# Patient Record
Sex: Female | Born: 1945 | Race: White | Hispanic: No | Smoking: Never smoker
Health system: Southern US, Community
[De-identification: ages and names within clinical notes are randomized; demographics above are authoritative.]

## PROBLEM LIST (undated history)

## (undated) DIAGNOSIS — K2990 Gastroduodenitis, unspecified, without bleeding: Secondary | ICD-10-CM

## (undated) DIAGNOSIS — J029 Acute pharyngitis, unspecified: Secondary | ICD-10-CM

## (undated) DIAGNOSIS — J309 Allergic rhinitis, unspecified: Secondary | ICD-10-CM

## (undated) DIAGNOSIS — M810 Age-related osteoporosis without current pathological fracture: Secondary | ICD-10-CM

## (undated) DIAGNOSIS — G4730 Sleep apnea, unspecified: Secondary | ICD-10-CM

## (undated) DIAGNOSIS — T7840XA Allergy, unspecified, initial encounter: Secondary | ICD-10-CM

## (undated) DIAGNOSIS — K589 Irritable bowel syndrome without diarrhea: Secondary | ICD-10-CM

## (undated) DIAGNOSIS — J45909 Unspecified asthma, uncomplicated: Secondary | ICD-10-CM

## (undated) DIAGNOSIS — Z860100 Personal history of colon polyps, unspecified: Secondary | ICD-10-CM

## (undated) DIAGNOSIS — J4489 Other specified chronic obstructive pulmonary disease: Secondary | ICD-10-CM

## (undated) DIAGNOSIS — I10 Essential (primary) hypertension: Secondary | ICD-10-CM

## (undated) DIAGNOSIS — H6980 Other specified disorders of Eustachian tube, unspecified ear: Secondary | ICD-10-CM

## (undated) DIAGNOSIS — K649 Unspecified hemorrhoids: Secondary | ICD-10-CM

## (undated) DIAGNOSIS — H6990 Unspecified Eustachian tube disorder, unspecified ear: Secondary | ICD-10-CM

## (undated) DIAGNOSIS — J019 Acute sinusitis, unspecified: Secondary | ICD-10-CM

## (undated) DIAGNOSIS — J449 Chronic obstructive pulmonary disease, unspecified: Secondary | ICD-10-CM

## (undated) DIAGNOSIS — K219 Gastro-esophageal reflux disease without esophagitis: Secondary | ICD-10-CM

## (undated) DIAGNOSIS — K2970 Gastritis, unspecified, without bleeding: Secondary | ICD-10-CM

## (undated) DIAGNOSIS — K449 Diaphragmatic hernia without obstruction or gangrene: Secondary | ICD-10-CM

## (undated) DIAGNOSIS — M8580 Other specified disorders of bone density and structure, unspecified site: Secondary | ICD-10-CM

## (undated) DIAGNOSIS — K222 Esophageal obstruction: Secondary | ICD-10-CM

## (undated) HISTORY — DX: Unspecified eustachian tube disorder, unspecified ear: H69.90

## (undated) HISTORY — DX: Diaphragmatic hernia without obstruction or gangrene: K44.9

## (undated) HISTORY — PX: CYST REMOVAL HAND: SHX6279

## (undated) HISTORY — PX: TONSILLECTOMY: SUR1361

## (undated) HISTORY — DX: Acute sinusitis, unspecified: J01.90

## (undated) HISTORY — DX: Gastritis, unspecified, without bleeding: K29.70

## (undated) HISTORY — DX: Other specified disorders of bone density and structure, unspecified site: M85.80

## (undated) HISTORY — DX: Allergy, unspecified, initial encounter: T78.40XA

## (undated) HISTORY — DX: Acute pharyngitis, unspecified: J02.9

## (undated) HISTORY — DX: Esophageal obstruction: K22.2

## (undated) HISTORY — DX: Other specified disorders of Eustachian tube, unspecified ear: H69.80

## (undated) HISTORY — DX: Gastroduodenitis, unspecified, without bleeding: K29.90

## (undated) HISTORY — PX: FOOT SURGERY: SHX648

## (undated) HISTORY — PX: UPPER GASTROINTESTINAL ENDOSCOPY: SHX188

## (undated) HISTORY — DX: Gastro-esophageal reflux disease without esophagitis: K21.9

## (undated) HISTORY — PX: ABDOMINAL HYSTERECTOMY: SHX81

## (undated) HISTORY — PX: LAPAROSCOPIC HYSTERECTOMY: SHX1926

## (undated) HISTORY — DX: Other specified chronic obstructive pulmonary disease: J44.89

## (undated) HISTORY — DX: Allergic rhinitis, unspecified: J30.9

## (undated) HISTORY — DX: Personal history of colonic polyps: Z86.010

## (undated) HISTORY — DX: Age-related osteoporosis without current pathological fracture: M81.0

## (undated) HISTORY — DX: Irritable bowel syndrome, unspecified: K58.9

## (undated) HISTORY — PX: COLONOSCOPY: SHX174

## (undated) HISTORY — DX: Unspecified asthma, uncomplicated: J45.909

## (undated) HISTORY — DX: Chronic obstructive pulmonary disease, unspecified: J44.9

## (undated) HISTORY — DX: Personal history of colon polyps, unspecified: Z86.0100

## (undated) HISTORY — DX: Unspecified hemorrhoids: K64.9

---

## 1999-04-20 ENCOUNTER — Emergency Department (HOSPITAL_COMMUNITY): Admission: EM | Admit: 1999-04-20 | Discharge: 1999-04-20 | Payer: Self-pay | Admitting: Emergency Medicine

## 1999-04-20 ENCOUNTER — Encounter: Payer: Self-pay | Admitting: Emergency Medicine

## 1999-08-29 ENCOUNTER — Other Ambulatory Visit: Admission: RE | Admit: 1999-08-29 | Discharge: 1999-08-29 | Payer: Self-pay | Admitting: *Deleted

## 2000-03-14 ENCOUNTER — Ambulatory Visit (HOSPITAL_COMMUNITY): Admission: RE | Admit: 2000-03-14 | Discharge: 2000-03-14 | Payer: Self-pay | Admitting: *Deleted

## 2001-03-15 ENCOUNTER — Ambulatory Visit (HOSPITAL_COMMUNITY): Admission: RE | Admit: 2001-03-15 | Discharge: 2001-03-15 | Payer: Self-pay | Admitting: Gastroenterology

## 2001-03-15 ENCOUNTER — Encounter: Payer: Self-pay | Admitting: Gastroenterology

## 2001-03-21 ENCOUNTER — Encounter (INDEPENDENT_AMBULATORY_CARE_PROVIDER_SITE_OTHER): Payer: Self-pay

## 2001-03-21 ENCOUNTER — Other Ambulatory Visit: Admission: RE | Admit: 2001-03-21 | Discharge: 2001-03-21 | Payer: Self-pay | Admitting: Gastroenterology

## 2001-04-12 ENCOUNTER — Ambulatory Visit (HOSPITAL_COMMUNITY): Admission: RE | Admit: 2001-04-12 | Discharge: 2001-04-12 | Payer: Self-pay | Admitting: Urology

## 2001-04-12 ENCOUNTER — Encounter: Payer: Self-pay | Admitting: Urology

## 2001-10-03 ENCOUNTER — Ambulatory Visit (HOSPITAL_COMMUNITY): Admission: RE | Admit: 2001-10-03 | Discharge: 2001-10-03 | Payer: Self-pay | Admitting: Urology

## 2001-10-03 ENCOUNTER — Encounter: Payer: Self-pay | Admitting: Urology

## 2001-12-20 ENCOUNTER — Other Ambulatory Visit: Admission: RE | Admit: 2001-12-20 | Discharge: 2001-12-20 | Payer: Self-pay | Admitting: *Deleted

## 2002-12-17 ENCOUNTER — Emergency Department (HOSPITAL_COMMUNITY): Admission: EM | Admit: 2002-12-17 | Discharge: 2002-12-18 | Payer: Self-pay | Admitting: Emergency Medicine

## 2002-12-18 ENCOUNTER — Encounter: Payer: Self-pay | Admitting: Emergency Medicine

## 2003-01-19 ENCOUNTER — Other Ambulatory Visit: Admission: RE | Admit: 2003-01-19 | Discharge: 2003-01-19 | Payer: Self-pay | Admitting: *Deleted

## 2003-04-28 ENCOUNTER — Ambulatory Visit (HOSPITAL_COMMUNITY): Admission: RE | Admit: 2003-04-28 | Discharge: 2003-04-28 | Payer: Self-pay | Admitting: Gastroenterology

## 2004-04-08 ENCOUNTER — Other Ambulatory Visit: Admission: RE | Admit: 2004-04-08 | Discharge: 2004-04-08 | Payer: Self-pay | Admitting: Obstetrics and Gynecology

## 2004-08-12 ENCOUNTER — Encounter: Admission: RE | Admit: 2004-08-12 | Discharge: 2004-08-12 | Payer: Self-pay | Admitting: Internal Medicine

## 2004-08-25 ENCOUNTER — Ambulatory Visit: Payer: Self-pay | Admitting: Internal Medicine

## 2004-08-31 ENCOUNTER — Ambulatory Visit: Payer: Self-pay | Admitting: Internal Medicine

## 2004-09-02 ENCOUNTER — Ambulatory Visit: Payer: Self-pay | Admitting: Internal Medicine

## 2004-09-05 ENCOUNTER — Ambulatory Visit: Payer: Self-pay | Admitting: Internal Medicine

## 2004-09-08 ENCOUNTER — Ambulatory Visit: Payer: Self-pay | Admitting: Internal Medicine

## 2004-09-12 ENCOUNTER — Ambulatory Visit: Payer: Self-pay | Admitting: Internal Medicine

## 2004-09-16 ENCOUNTER — Ambulatory Visit: Payer: Self-pay | Admitting: Internal Medicine

## 2004-09-19 ENCOUNTER — Ambulatory Visit: Payer: Self-pay | Admitting: Internal Medicine

## 2004-09-23 ENCOUNTER — Ambulatory Visit: Payer: Self-pay | Admitting: Internal Medicine

## 2004-09-26 ENCOUNTER — Ambulatory Visit: Payer: Self-pay | Admitting: Internal Medicine

## 2004-09-30 ENCOUNTER — Ambulatory Visit: Payer: Self-pay | Admitting: Internal Medicine

## 2004-10-03 ENCOUNTER — Ambulatory Visit: Payer: Self-pay | Admitting: Internal Medicine

## 2004-10-06 ENCOUNTER — Ambulatory Visit: Payer: Self-pay | Admitting: Internal Medicine

## 2004-10-10 ENCOUNTER — Ambulatory Visit: Payer: Self-pay | Admitting: Internal Medicine

## 2004-10-13 ENCOUNTER — Ambulatory Visit: Payer: Self-pay | Admitting: Internal Medicine

## 2004-10-17 ENCOUNTER — Ambulatory Visit: Payer: Self-pay | Admitting: Internal Medicine

## 2004-10-21 ENCOUNTER — Ambulatory Visit: Payer: Self-pay | Admitting: Internal Medicine

## 2004-10-25 ENCOUNTER — Ambulatory Visit: Payer: Self-pay | Admitting: Internal Medicine

## 2004-10-26 ENCOUNTER — Ambulatory Visit: Payer: Self-pay | Admitting: Internal Medicine

## 2004-10-28 ENCOUNTER — Ambulatory Visit: Payer: Self-pay | Admitting: Internal Medicine

## 2004-11-01 ENCOUNTER — Ambulatory Visit: Payer: Self-pay | Admitting: Internal Medicine

## 2004-11-04 ENCOUNTER — Ambulatory Visit: Payer: Self-pay | Admitting: Internal Medicine

## 2004-11-07 ENCOUNTER — Ambulatory Visit: Payer: Self-pay | Admitting: Internal Medicine

## 2004-11-10 ENCOUNTER — Ambulatory Visit: Payer: Self-pay | Admitting: Internal Medicine

## 2004-11-11 ENCOUNTER — Ambulatory Visit: Payer: Self-pay | Admitting: Internal Medicine

## 2004-11-14 ENCOUNTER — Ambulatory Visit: Payer: Self-pay | Admitting: Internal Medicine

## 2004-11-17 ENCOUNTER — Ambulatory Visit: Payer: Self-pay | Admitting: Internal Medicine

## 2004-11-21 ENCOUNTER — Ambulatory Visit: Payer: Self-pay | Admitting: Internal Medicine

## 2004-11-24 ENCOUNTER — Ambulatory Visit: Payer: Self-pay | Admitting: Internal Medicine

## 2004-11-28 ENCOUNTER — Ambulatory Visit: Payer: Self-pay | Admitting: Internal Medicine

## 2004-12-01 ENCOUNTER — Ambulatory Visit: Payer: Self-pay | Admitting: Internal Medicine

## 2004-12-05 ENCOUNTER — Ambulatory Visit: Payer: Self-pay | Admitting: Internal Medicine

## 2004-12-12 ENCOUNTER — Ambulatory Visit: Payer: Self-pay | Admitting: Internal Medicine

## 2004-12-16 ENCOUNTER — Ambulatory Visit: Payer: Self-pay | Admitting: Gastroenterology

## 2004-12-19 ENCOUNTER — Ambulatory Visit: Payer: Self-pay | Admitting: Internal Medicine

## 2004-12-20 ENCOUNTER — Ambulatory Visit: Payer: Self-pay | Admitting: Gastroenterology

## 2004-12-28 ENCOUNTER — Ambulatory Visit: Payer: Self-pay | Admitting: Internal Medicine

## 2005-01-06 ENCOUNTER — Ambulatory Visit: Payer: Self-pay | Admitting: Internal Medicine

## 2005-01-13 ENCOUNTER — Ambulatory Visit: Payer: Self-pay | Admitting: Internal Medicine

## 2005-01-20 ENCOUNTER — Ambulatory Visit: Payer: Self-pay | Admitting: Internal Medicine

## 2005-01-27 ENCOUNTER — Ambulatory Visit: Payer: Self-pay | Admitting: Internal Medicine

## 2005-01-27 ENCOUNTER — Ambulatory Visit: Payer: Self-pay | Admitting: Gastroenterology

## 2005-02-03 ENCOUNTER — Ambulatory Visit: Payer: Self-pay | Admitting: Internal Medicine

## 2005-02-09 ENCOUNTER — Ambulatory Visit: Payer: Self-pay | Admitting: Internal Medicine

## 2005-02-16 ENCOUNTER — Ambulatory Visit: Payer: Self-pay | Admitting: Internal Medicine

## 2005-02-23 ENCOUNTER — Ambulatory Visit: Payer: Self-pay | Admitting: Internal Medicine

## 2005-02-28 ENCOUNTER — Ambulatory Visit: Payer: Self-pay | Admitting: Internal Medicine

## 2005-03-09 ENCOUNTER — Ambulatory Visit: Payer: Self-pay | Admitting: Internal Medicine

## 2005-03-16 ENCOUNTER — Ambulatory Visit: Payer: Self-pay | Admitting: Internal Medicine

## 2005-03-17 ENCOUNTER — Ambulatory Visit: Payer: Self-pay | Admitting: Internal Medicine

## 2005-03-23 ENCOUNTER — Ambulatory Visit: Payer: Self-pay | Admitting: Internal Medicine

## 2005-03-29 ENCOUNTER — Ambulatory Visit: Payer: Self-pay | Admitting: Internal Medicine

## 2005-04-04 ENCOUNTER — Ambulatory Visit: Payer: Self-pay | Admitting: Internal Medicine

## 2005-04-12 ENCOUNTER — Ambulatory Visit: Payer: Self-pay | Admitting: Internal Medicine

## 2005-04-13 ENCOUNTER — Ambulatory Visit: Payer: Self-pay | Admitting: Internal Medicine

## 2005-04-14 ENCOUNTER — Ambulatory Visit: Payer: Self-pay | Admitting: Internal Medicine

## 2005-04-19 ENCOUNTER — Ambulatory Visit: Payer: Self-pay | Admitting: Internal Medicine

## 2005-04-24 ENCOUNTER — Ambulatory Visit: Payer: Self-pay | Admitting: Internal Medicine

## 2005-05-03 ENCOUNTER — Ambulatory Visit: Payer: Self-pay | Admitting: Internal Medicine

## 2005-05-09 ENCOUNTER — Ambulatory Visit: Payer: Self-pay | Admitting: Internal Medicine

## 2005-05-16 ENCOUNTER — Other Ambulatory Visit: Admission: RE | Admit: 2005-05-16 | Discharge: 2005-05-16 | Payer: Self-pay | Admitting: Obstetrics and Gynecology

## 2005-05-18 ENCOUNTER — Ambulatory Visit: Payer: Self-pay | Admitting: Internal Medicine

## 2005-05-25 ENCOUNTER — Ambulatory Visit: Payer: Self-pay | Admitting: Internal Medicine

## 2005-05-31 ENCOUNTER — Ambulatory Visit: Payer: Self-pay | Admitting: Internal Medicine

## 2005-06-09 ENCOUNTER — Ambulatory Visit: Payer: Self-pay | Admitting: Internal Medicine

## 2005-06-14 ENCOUNTER — Ambulatory Visit: Payer: Self-pay | Admitting: Internal Medicine

## 2005-06-21 ENCOUNTER — Ambulatory Visit: Payer: Self-pay | Admitting: Internal Medicine

## 2005-06-28 ENCOUNTER — Ambulatory Visit: Payer: Self-pay | Admitting: Internal Medicine

## 2005-07-04 ENCOUNTER — Ambulatory Visit: Payer: Self-pay | Admitting: Internal Medicine

## 2005-07-12 ENCOUNTER — Ambulatory Visit: Payer: Self-pay | Admitting: Internal Medicine

## 2005-07-19 ENCOUNTER — Ambulatory Visit: Payer: Self-pay | Admitting: Internal Medicine

## 2005-07-25 ENCOUNTER — Ambulatory Visit: Payer: Self-pay | Admitting: Internal Medicine

## 2005-08-04 ENCOUNTER — Ambulatory Visit: Payer: Self-pay | Admitting: Internal Medicine

## 2005-08-11 ENCOUNTER — Ambulatory Visit: Payer: Self-pay | Admitting: Internal Medicine

## 2005-08-17 ENCOUNTER — Ambulatory Visit: Payer: Self-pay | Admitting: Internal Medicine

## 2005-08-25 ENCOUNTER — Ambulatory Visit: Payer: Self-pay | Admitting: Internal Medicine

## 2005-08-28 ENCOUNTER — Ambulatory Visit: Payer: Self-pay | Admitting: Internal Medicine

## 2005-08-31 ENCOUNTER — Ambulatory Visit: Payer: Self-pay | Admitting: Internal Medicine

## 2005-09-08 ENCOUNTER — Ambulatory Visit: Payer: Self-pay | Admitting: Internal Medicine

## 2005-09-13 ENCOUNTER — Ambulatory Visit: Payer: Self-pay | Admitting: Internal Medicine

## 2005-09-21 ENCOUNTER — Ambulatory Visit: Payer: Self-pay | Admitting: Internal Medicine

## 2005-09-26 ENCOUNTER — Ambulatory Visit: Payer: Self-pay | Admitting: Internal Medicine

## 2005-10-05 ENCOUNTER — Ambulatory Visit: Payer: Self-pay | Admitting: Internal Medicine

## 2005-10-12 ENCOUNTER — Ambulatory Visit: Payer: Self-pay | Admitting: Internal Medicine

## 2005-10-18 ENCOUNTER — Ambulatory Visit: Payer: Self-pay | Admitting: Internal Medicine

## 2005-10-26 ENCOUNTER — Ambulatory Visit: Payer: Self-pay | Admitting: Internal Medicine

## 2005-11-03 ENCOUNTER — Ambulatory Visit: Payer: Self-pay | Admitting: Internal Medicine

## 2005-11-10 ENCOUNTER — Ambulatory Visit: Payer: Self-pay | Admitting: Internal Medicine

## 2005-11-16 ENCOUNTER — Ambulatory Visit: Payer: Self-pay | Admitting: Internal Medicine

## 2005-11-16 ENCOUNTER — Ambulatory Visit: Payer: Self-pay | Admitting: Gastroenterology

## 2005-11-23 ENCOUNTER — Ambulatory Visit: Payer: Self-pay | Admitting: Internal Medicine

## 2005-11-30 ENCOUNTER — Ambulatory Visit: Payer: Self-pay | Admitting: Internal Medicine

## 2005-12-05 ENCOUNTER — Ambulatory Visit: Payer: Self-pay | Admitting: Internal Medicine

## 2005-12-05 ENCOUNTER — Ambulatory Visit: Payer: Self-pay | Admitting: Gastroenterology

## 2005-12-13 ENCOUNTER — Ambulatory Visit: Payer: Self-pay | Admitting: Gastroenterology

## 2005-12-15 ENCOUNTER — Ambulatory Visit: Payer: Self-pay | Admitting: Internal Medicine

## 2005-12-21 ENCOUNTER — Ambulatory Visit: Payer: Self-pay | Admitting: Internal Medicine

## 2005-12-27 ENCOUNTER — Ambulatory Visit: Payer: Self-pay | Admitting: Internal Medicine

## 2006-01-05 ENCOUNTER — Ambulatory Visit: Payer: Self-pay | Admitting: Internal Medicine

## 2006-01-12 ENCOUNTER — Ambulatory Visit: Payer: Self-pay | Admitting: Internal Medicine

## 2006-01-18 ENCOUNTER — Ambulatory Visit: Payer: Self-pay | Admitting: Internal Medicine

## 2006-01-25 ENCOUNTER — Ambulatory Visit: Payer: Self-pay | Admitting: Internal Medicine

## 2006-02-05 ENCOUNTER — Ambulatory Visit: Payer: Self-pay | Admitting: Internal Medicine

## 2006-02-16 ENCOUNTER — Ambulatory Visit: Payer: Self-pay | Admitting: Internal Medicine

## 2006-02-28 ENCOUNTER — Ambulatory Visit: Payer: Self-pay | Admitting: Internal Medicine

## 2006-03-05 ENCOUNTER — Ambulatory Visit: Payer: Self-pay | Admitting: Internal Medicine

## 2006-03-16 ENCOUNTER — Ambulatory Visit: Payer: Self-pay | Admitting: Internal Medicine

## 2006-03-23 ENCOUNTER — Ambulatory Visit: Payer: Self-pay | Admitting: Internal Medicine

## 2006-03-26 ENCOUNTER — Ambulatory Visit: Payer: Self-pay | Admitting: Internal Medicine

## 2006-04-02 ENCOUNTER — Ambulatory Visit: Payer: Self-pay | Admitting: Internal Medicine

## 2006-04-13 ENCOUNTER — Ambulatory Visit: Payer: Self-pay | Admitting: Internal Medicine

## 2006-04-20 ENCOUNTER — Ambulatory Visit: Payer: Self-pay | Admitting: Internal Medicine

## 2006-04-27 ENCOUNTER — Ambulatory Visit: Payer: Self-pay | Admitting: Internal Medicine

## 2006-05-07 ENCOUNTER — Ambulatory Visit: Payer: Self-pay | Admitting: Internal Medicine

## 2006-05-08 ENCOUNTER — Ambulatory Visit: Payer: Self-pay | Admitting: Internal Medicine

## 2006-05-25 ENCOUNTER — Ambulatory Visit: Payer: Self-pay | Admitting: Internal Medicine

## 2006-06-01 ENCOUNTER — Ambulatory Visit: Payer: Self-pay | Admitting: Internal Medicine

## 2006-06-11 ENCOUNTER — Ambulatory Visit: Payer: Self-pay | Admitting: Internal Medicine

## 2006-06-21 ENCOUNTER — Ambulatory Visit: Payer: Self-pay | Admitting: Internal Medicine

## 2006-06-26 ENCOUNTER — Ambulatory Visit: Payer: Self-pay | Admitting: Internal Medicine

## 2006-07-06 ENCOUNTER — Ambulatory Visit: Payer: Self-pay | Admitting: Internal Medicine

## 2006-07-13 ENCOUNTER — Ambulatory Visit: Payer: Self-pay | Admitting: Internal Medicine

## 2006-07-20 ENCOUNTER — Ambulatory Visit: Payer: Self-pay | Admitting: Internal Medicine

## 2006-07-27 ENCOUNTER — Ambulatory Visit: Payer: Self-pay | Admitting: Internal Medicine

## 2006-08-06 ENCOUNTER — Ambulatory Visit: Payer: Self-pay | Admitting: Internal Medicine

## 2006-08-23 ENCOUNTER — Ambulatory Visit: Payer: Self-pay | Admitting: Internal Medicine

## 2006-08-31 ENCOUNTER — Ambulatory Visit: Payer: Self-pay | Admitting: Internal Medicine

## 2006-09-14 ENCOUNTER — Ambulatory Visit: Payer: Self-pay | Admitting: Internal Medicine

## 2006-09-21 ENCOUNTER — Ambulatory Visit: Payer: Self-pay | Admitting: Internal Medicine

## 2006-10-03 ENCOUNTER — Ambulatory Visit: Payer: Self-pay | Admitting: Internal Medicine

## 2006-10-19 ENCOUNTER — Ambulatory Visit: Payer: Self-pay | Admitting: Internal Medicine

## 2006-10-23 ENCOUNTER — Other Ambulatory Visit: Admission: RE | Admit: 2006-10-23 | Discharge: 2006-10-23 | Payer: Self-pay | Admitting: Obstetrics and Gynecology

## 2006-10-26 ENCOUNTER — Ambulatory Visit: Payer: Self-pay | Admitting: Internal Medicine

## 2006-10-30 ENCOUNTER — Ambulatory Visit: Payer: Self-pay | Admitting: Internal Medicine

## 2006-11-02 ENCOUNTER — Ambulatory Visit: Payer: Self-pay | Admitting: Internal Medicine

## 2006-11-12 ENCOUNTER — Ambulatory Visit: Payer: Self-pay | Admitting: Internal Medicine

## 2006-11-26 ENCOUNTER — Ambulatory Visit: Payer: Self-pay | Admitting: Internal Medicine

## 2006-12-07 ENCOUNTER — Ambulatory Visit: Payer: Self-pay | Admitting: Internal Medicine

## 2006-12-14 ENCOUNTER — Ambulatory Visit: Payer: Self-pay | Admitting: Internal Medicine

## 2006-12-19 ENCOUNTER — Ambulatory Visit: Payer: Self-pay | Admitting: Internal Medicine

## 2006-12-28 ENCOUNTER — Ambulatory Visit: Payer: Self-pay | Admitting: Internal Medicine

## 2007-01-03 ENCOUNTER — Ambulatory Visit: Payer: Self-pay | Admitting: Internal Medicine

## 2007-01-18 ENCOUNTER — Ambulatory Visit: Payer: Self-pay | Admitting: Internal Medicine

## 2007-01-24 ENCOUNTER — Ambulatory Visit: Payer: Self-pay | Admitting: Internal Medicine

## 2007-02-01 ENCOUNTER — Ambulatory Visit: Payer: Self-pay | Admitting: Internal Medicine

## 2007-02-15 ENCOUNTER — Ambulatory Visit: Payer: Self-pay | Admitting: Internal Medicine

## 2007-02-22 ENCOUNTER — Ambulatory Visit: Payer: Self-pay | Admitting: Internal Medicine

## 2007-03-01 ENCOUNTER — Ambulatory Visit: Payer: Self-pay | Admitting: Internal Medicine

## 2007-03-12 DIAGNOSIS — J449 Chronic obstructive pulmonary disease, unspecified: Secondary | ICD-10-CM

## 2007-03-12 DIAGNOSIS — H6980 Other specified disorders of Eustachian tube, unspecified ear: Secondary | ICD-10-CM

## 2007-03-12 DIAGNOSIS — H6990 Unspecified Eustachian tube disorder, unspecified ear: Secondary | ICD-10-CM | POA: Insufficient documentation

## 2007-03-12 DIAGNOSIS — J3089 Other allergic rhinitis: Secondary | ICD-10-CM

## 2007-03-12 DIAGNOSIS — K219 Gastro-esophageal reflux disease without esophagitis: Secondary | ICD-10-CM

## 2007-03-12 DIAGNOSIS — J4520 Mild intermittent asthma, uncomplicated: Secondary | ICD-10-CM | POA: Insufficient documentation

## 2007-03-12 DIAGNOSIS — J302 Other seasonal allergic rhinitis: Secondary | ICD-10-CM | POA: Insufficient documentation

## 2007-03-15 ENCOUNTER — Ambulatory Visit: Payer: Self-pay | Admitting: Internal Medicine

## 2007-03-22 ENCOUNTER — Ambulatory Visit: Payer: Self-pay | Admitting: Internal Medicine

## 2007-04-05 ENCOUNTER — Ambulatory Visit: Payer: Self-pay | Admitting: Internal Medicine

## 2007-04-17 ENCOUNTER — Ambulatory Visit: Payer: Self-pay | Admitting: Internal Medicine

## 2007-04-18 ENCOUNTER — Ambulatory Visit: Payer: Self-pay | Admitting: Internal Medicine

## 2007-05-10 ENCOUNTER — Ambulatory Visit: Payer: Self-pay | Admitting: Internal Medicine

## 2007-05-17 ENCOUNTER — Ambulatory Visit: Payer: Self-pay | Admitting: Internal Medicine

## 2007-05-24 ENCOUNTER — Ambulatory Visit: Payer: Self-pay | Admitting: Internal Medicine

## 2007-06-03 ENCOUNTER — Ambulatory Visit: Payer: Self-pay | Admitting: Internal Medicine

## 2007-06-17 ENCOUNTER — Ambulatory Visit: Payer: Self-pay | Admitting: Internal Medicine

## 2007-07-02 ENCOUNTER — Ambulatory Visit: Payer: Self-pay | Admitting: Internal Medicine

## 2007-07-23 ENCOUNTER — Ambulatory Visit: Payer: Self-pay | Admitting: Internal Medicine

## 2007-08-02 ENCOUNTER — Ambulatory Visit: Payer: Self-pay | Admitting: Internal Medicine

## 2007-08-16 ENCOUNTER — Ambulatory Visit: Payer: Self-pay | Admitting: Internal Medicine

## 2007-08-22 ENCOUNTER — Ambulatory Visit: Payer: Self-pay | Admitting: Internal Medicine

## 2007-09-04 ENCOUNTER — Ambulatory Visit: Payer: Self-pay | Admitting: Internal Medicine

## 2007-09-12 ENCOUNTER — Ambulatory Visit: Payer: Self-pay | Admitting: Internal Medicine

## 2007-09-18 ENCOUNTER — Ambulatory Visit: Payer: Self-pay | Admitting: Internal Medicine

## 2007-10-02 ENCOUNTER — Ambulatory Visit: Payer: Self-pay | Admitting: Internal Medicine

## 2007-10-11 ENCOUNTER — Ambulatory Visit: Payer: Self-pay | Admitting: Internal Medicine

## 2007-10-18 ENCOUNTER — Ambulatory Visit: Payer: Self-pay | Admitting: Internal Medicine

## 2007-10-25 ENCOUNTER — Ambulatory Visit: Payer: Self-pay | Admitting: Internal Medicine

## 2007-11-12 ENCOUNTER — Ambulatory Visit: Payer: Self-pay | Admitting: Internal Medicine

## 2007-11-22 ENCOUNTER — Ambulatory Visit: Payer: Self-pay | Admitting: Internal Medicine

## 2007-11-28 ENCOUNTER — Ambulatory Visit: Payer: Self-pay | Admitting: Internal Medicine

## 2007-12-02 ENCOUNTER — Ambulatory Visit: Payer: Self-pay | Admitting: Internal Medicine

## 2007-12-06 ENCOUNTER — Ambulatory Visit: Payer: Self-pay | Admitting: Internal Medicine

## 2007-12-18 ENCOUNTER — Telehealth: Payer: Self-pay | Admitting: Internal Medicine

## 2007-12-19 ENCOUNTER — Ambulatory Visit: Payer: Self-pay | Admitting: Internal Medicine

## 2007-12-19 DIAGNOSIS — J029 Acute pharyngitis, unspecified: Secondary | ICD-10-CM | POA: Insufficient documentation

## 2007-12-19 LAB — CONVERTED CEMR LAB: Streptococcus, Group A Screen (Direct): NEGATIVE

## 2007-12-25 ENCOUNTER — Telehealth: Payer: Self-pay | Admitting: Adult Health

## 2007-12-26 ENCOUNTER — Ambulatory Visit: Payer: Self-pay | Admitting: Internal Medicine

## 2007-12-26 DIAGNOSIS — J019 Acute sinusitis, unspecified: Secondary | ICD-10-CM

## 2008-01-03 ENCOUNTER — Ambulatory Visit: Payer: Self-pay | Admitting: Internal Medicine

## 2008-01-17 ENCOUNTER — Ambulatory Visit: Payer: Self-pay | Admitting: Internal Medicine

## 2008-01-24 ENCOUNTER — Ambulatory Visit: Payer: Self-pay | Admitting: Internal Medicine

## 2008-02-07 ENCOUNTER — Ambulatory Visit: Payer: Self-pay | Admitting: Internal Medicine

## 2008-02-13 ENCOUNTER — Ambulatory Visit: Payer: Self-pay | Admitting: Internal Medicine

## 2008-02-13 ENCOUNTER — Ambulatory Visit: Payer: Self-pay | Admitting: Pulmonary Disease

## 2008-02-25 ENCOUNTER — Ambulatory Visit: Payer: Self-pay | Admitting: Internal Medicine

## 2008-03-12 ENCOUNTER — Ambulatory Visit: Payer: Self-pay | Admitting: Internal Medicine

## 2008-03-16 DIAGNOSIS — K449 Diaphragmatic hernia without obstruction or gangrene: Secondary | ICD-10-CM | POA: Insufficient documentation

## 2008-03-16 DIAGNOSIS — K2990 Gastroduodenitis, unspecified, without bleeding: Secondary | ICD-10-CM

## 2008-03-16 DIAGNOSIS — K2970 Gastritis, unspecified, without bleeding: Secondary | ICD-10-CM | POA: Insufficient documentation

## 2008-03-16 DIAGNOSIS — Z860100 Personal history of colon polyps, unspecified: Secondary | ICD-10-CM | POA: Insufficient documentation

## 2008-03-20 ENCOUNTER — Ambulatory Visit: Payer: Self-pay | Admitting: Gastroenterology

## 2008-03-20 ENCOUNTER — Ambulatory Visit: Payer: Self-pay | Admitting: Internal Medicine

## 2008-03-20 DIAGNOSIS — K589 Irritable bowel syndrome without diarrhea: Secondary | ICD-10-CM | POA: Insufficient documentation

## 2008-03-20 DIAGNOSIS — R197 Diarrhea, unspecified: Secondary | ICD-10-CM

## 2008-03-20 DIAGNOSIS — R1013 Epigastric pain: Secondary | ICD-10-CM | POA: Insufficient documentation

## 2008-03-20 LAB — CONVERTED CEMR LAB
Basophils Absolute: 0 10*3/uL (ref 0.0–0.1)
Eosinophils Absolute: 0.1 10*3/uL (ref 0.0–0.7)
Ferritin: 13.8 ng/mL (ref 10.0–291.0)
Folate: >20 ng/mL
HCT: 36.4 % (ref 36.0–46.0)
Hemoglobin: 12.6 g/dL (ref 12.0–15.0)
Lymphocytes Relative: 32.7 % (ref 12.0–46.0)
MCHC: 34.6 g/dL (ref 30.0–36.0)
Neutrophils Relative %: 56 % (ref 43.0–77.0)
Platelets: 233 10*3/uL (ref 150–400)
RBC: 4.08 M/uL (ref 3.87–5.11)
Transferrin: 341.9 mg/dL (ref >=212.0)
Vitamin B-12: 529 pg/mL (ref 211–911)

## 2008-03-24 ENCOUNTER — Ambulatory Visit: Payer: Self-pay | Admitting: Internal Medicine

## 2008-03-24 ENCOUNTER — Encounter: Payer: Self-pay | Admitting: Gastroenterology

## 2008-03-25 ENCOUNTER — Telehealth: Payer: Self-pay | Admitting: Gastroenterology

## 2008-03-26 ENCOUNTER — Emergency Department (HOSPITAL_COMMUNITY): Admission: EM | Admit: 2008-03-26 | Discharge: 2008-03-26 | Payer: Self-pay | Admitting: Emergency Medicine

## 2008-03-26 ENCOUNTER — Telehealth: Payer: Self-pay | Admitting: Gastroenterology

## 2008-03-31 ENCOUNTER — Ambulatory Visit: Payer: Self-pay | Admitting: Gastroenterology

## 2008-04-03 ENCOUNTER — Ambulatory Visit (HOSPITAL_COMMUNITY): Admission: RE | Admit: 2008-04-03 | Discharge: 2008-04-03 | Payer: Self-pay | Admitting: Gastroenterology

## 2008-04-07 ENCOUNTER — Telehealth: Payer: Self-pay | Admitting: Gastroenterology

## 2008-04-09 ENCOUNTER — Ambulatory Visit: Payer: Self-pay | Admitting: Internal Medicine

## 2008-04-17 ENCOUNTER — Ambulatory Visit: Payer: Self-pay | Admitting: Internal Medicine

## 2008-04-21 ENCOUNTER — Ambulatory Visit: Payer: Self-pay | Admitting: Internal Medicine

## 2008-04-29 ENCOUNTER — Ambulatory Visit (HOSPITAL_COMMUNITY): Admission: RE | Admit: 2008-04-29 | Discharge: 2008-04-29 | Payer: Self-pay | Admitting: Obstetrics and Gynecology

## 2008-04-30 ENCOUNTER — Ambulatory Visit: Payer: Self-pay | Admitting: Gastroenterology

## 2008-05-14 ENCOUNTER — Ambulatory Visit: Payer: Self-pay | Admitting: Internal Medicine

## 2008-06-01 ENCOUNTER — Ambulatory Visit: Payer: Self-pay | Admitting: Internal Medicine

## 2008-06-08 ENCOUNTER — Ambulatory Visit: Payer: Self-pay | Admitting: Internal Medicine

## 2008-06-15 ENCOUNTER — Ambulatory Visit: Payer: Self-pay | Admitting: Internal Medicine

## 2008-06-16 ENCOUNTER — Ambulatory Visit: Payer: Self-pay | Admitting: Pulmonary Disease

## 2008-06-26 ENCOUNTER — Ambulatory Visit: Payer: Self-pay | Admitting: Internal Medicine

## 2008-07-06 ENCOUNTER — Ambulatory Visit: Payer: Self-pay | Admitting: Internal Medicine

## 2008-07-13 ENCOUNTER — Ambulatory Visit: Payer: Self-pay | Admitting: Internal Medicine

## 2008-07-18 ENCOUNTER — Encounter: Admission: RE | Admit: 2008-07-18 | Discharge: 2008-07-18 | Payer: Self-pay | Admitting: Orthopedic Surgery

## 2008-07-21 ENCOUNTER — Ambulatory Visit: Payer: Self-pay | Admitting: Internal Medicine

## 2008-08-06 ENCOUNTER — Ambulatory Visit: Payer: Self-pay | Admitting: Internal Medicine

## 2008-08-18 ENCOUNTER — Ambulatory Visit: Payer: Self-pay | Admitting: Internal Medicine

## 2008-09-02 ENCOUNTER — Ambulatory Visit (HOSPITAL_BASED_OUTPATIENT_CLINIC_OR_DEPARTMENT_OTHER): Admission: RE | Admit: 2008-09-02 | Discharge: 2008-09-02 | Payer: Self-pay | Admitting: Orthopedic Surgery

## 2008-09-08 ENCOUNTER — Ambulatory Visit: Payer: Self-pay | Admitting: Internal Medicine

## 2008-09-15 ENCOUNTER — Ambulatory Visit: Payer: Self-pay | Admitting: Internal Medicine

## 2008-09-23 ENCOUNTER — Ambulatory Visit: Payer: Self-pay | Admitting: Internal Medicine

## 2008-09-30 ENCOUNTER — Ambulatory Visit: Payer: Self-pay | Admitting: Internal Medicine

## 2008-10-01 ENCOUNTER — Ambulatory Visit: Payer: Self-pay | Admitting: Internal Medicine

## 2008-10-09 ENCOUNTER — Ambulatory Visit: Payer: Self-pay | Admitting: Internal Medicine

## 2008-10-22 ENCOUNTER — Ambulatory Visit: Payer: Self-pay | Admitting: Internal Medicine

## 2008-10-29 ENCOUNTER — Ambulatory Visit: Payer: Self-pay | Admitting: Internal Medicine

## 2008-11-06 ENCOUNTER — Ambulatory Visit: Payer: Self-pay | Admitting: Internal Medicine

## 2008-11-19 ENCOUNTER — Ambulatory Visit: Payer: Self-pay | Admitting: Internal Medicine

## 2008-11-25 ENCOUNTER — Ambulatory Visit: Payer: Self-pay | Admitting: Internal Medicine

## 2008-12-08 ENCOUNTER — Ambulatory Visit: Payer: Self-pay | Admitting: Internal Medicine

## 2008-12-24 ENCOUNTER — Ambulatory Visit: Payer: Self-pay | Admitting: Internal Medicine

## 2008-12-31 ENCOUNTER — Ambulatory Visit: Payer: Self-pay | Admitting: Internal Medicine

## 2009-01-05 ENCOUNTER — Other Ambulatory Visit: Admission: RE | Admit: 2009-01-05 | Discharge: 2009-01-05 | Payer: Self-pay | Admitting: Obstetrics and Gynecology

## 2009-01-07 ENCOUNTER — Ambulatory Visit: Payer: Self-pay | Admitting: Internal Medicine

## 2009-01-18 ENCOUNTER — Ambulatory Visit: Payer: Self-pay | Admitting: Internal Medicine

## 2009-01-28 ENCOUNTER — Ambulatory Visit: Payer: Self-pay | Admitting: Internal Medicine

## 2009-02-02 ENCOUNTER — Ambulatory Visit: Payer: Self-pay | Admitting: Internal Medicine

## 2009-02-09 ENCOUNTER — Ambulatory Visit: Payer: Self-pay | Admitting: Internal Medicine

## 2009-02-16 ENCOUNTER — Ambulatory Visit: Payer: Self-pay | Admitting: Internal Medicine

## 2009-02-25 ENCOUNTER — Ambulatory Visit: Payer: Self-pay | Admitting: Internal Medicine

## 2009-02-26 ENCOUNTER — Ambulatory Visit: Payer: Self-pay | Admitting: Internal Medicine

## 2009-03-11 ENCOUNTER — Ambulatory Visit: Payer: Self-pay | Admitting: Internal Medicine

## 2009-03-18 ENCOUNTER — Ambulatory Visit: Payer: Self-pay | Admitting: Internal Medicine

## 2009-03-19 ENCOUNTER — Emergency Department (HOSPITAL_COMMUNITY): Admission: EM | Admit: 2009-03-19 | Discharge: 2009-03-19 | Payer: Self-pay | Admitting: Family Medicine

## 2009-03-24 ENCOUNTER — Ambulatory Visit: Payer: Self-pay | Admitting: Internal Medicine

## 2009-03-30 ENCOUNTER — Ambulatory Visit: Payer: Self-pay | Admitting: Internal Medicine

## 2009-04-13 ENCOUNTER — Encounter: Payer: Self-pay | Admitting: Gastroenterology

## 2009-04-13 ENCOUNTER — Ambulatory Visit: Payer: Self-pay | Admitting: Internal Medicine

## 2009-04-30 ENCOUNTER — Ambulatory Visit: Payer: Self-pay | Admitting: Internal Medicine

## 2009-05-10 ENCOUNTER — Telehealth: Payer: Self-pay | Admitting: Gastroenterology

## 2009-05-11 ENCOUNTER — Ambulatory Visit: Payer: Self-pay | Admitting: Internal Medicine

## 2009-05-25 ENCOUNTER — Ambulatory Visit: Payer: Self-pay | Admitting: Internal Medicine

## 2009-05-26 ENCOUNTER — Ambulatory Visit: Payer: Self-pay | Admitting: Internal Medicine

## 2009-06-02 ENCOUNTER — Telehealth: Payer: Self-pay | Admitting: Internal Medicine

## 2009-06-23 ENCOUNTER — Ambulatory Visit: Payer: Self-pay | Admitting: Internal Medicine

## 2009-07-09 ENCOUNTER — Ambulatory Visit: Payer: Self-pay | Admitting: Internal Medicine

## 2009-07-20 ENCOUNTER — Ambulatory Visit: Payer: Self-pay | Admitting: Internal Medicine

## 2009-07-29 ENCOUNTER — Ambulatory Visit: Payer: Self-pay | Admitting: Internal Medicine

## 2009-08-05 ENCOUNTER — Ambulatory Visit: Payer: Self-pay | Admitting: Internal Medicine

## 2009-08-13 ENCOUNTER — Ambulatory Visit: Payer: Self-pay | Admitting: Internal Medicine

## 2009-08-19 ENCOUNTER — Ambulatory Visit: Payer: Self-pay | Admitting: Internal Medicine

## 2009-08-26 ENCOUNTER — Ambulatory Visit: Payer: Self-pay | Admitting: Internal Medicine

## 2009-09-03 ENCOUNTER — Ambulatory Visit: Payer: Self-pay | Admitting: Internal Medicine

## 2009-09-14 ENCOUNTER — Ambulatory Visit: Payer: Self-pay | Admitting: Internal Medicine

## 2009-09-20 ENCOUNTER — Ambulatory Visit: Payer: Self-pay | Admitting: Internal Medicine

## 2009-10-01 ENCOUNTER — Ambulatory Visit: Payer: Self-pay | Admitting: Internal Medicine

## 2009-10-07 ENCOUNTER — Ambulatory Visit: Payer: Self-pay | Admitting: Internal Medicine

## 2009-10-14 ENCOUNTER — Ambulatory Visit: Payer: Self-pay | Admitting: Internal Medicine

## 2009-10-19 ENCOUNTER — Ambulatory Visit: Payer: Self-pay | Admitting: Internal Medicine

## 2009-10-20 ENCOUNTER — Ambulatory Visit: Payer: Self-pay | Admitting: Internal Medicine

## 2009-10-26 ENCOUNTER — Ambulatory Visit: Payer: Self-pay | Admitting: Internal Medicine

## 2009-11-04 ENCOUNTER — Ambulatory Visit: Payer: Self-pay | Admitting: Internal Medicine

## 2009-11-12 ENCOUNTER — Ambulatory Visit: Payer: Self-pay | Admitting: Internal Medicine

## 2009-11-19 ENCOUNTER — Ambulatory Visit: Payer: Self-pay | Admitting: Internal Medicine

## 2009-11-25 ENCOUNTER — Ambulatory Visit: Payer: Self-pay | Admitting: Internal Medicine

## 2009-12-02 ENCOUNTER — Ambulatory Visit: Payer: Self-pay | Admitting: Internal Medicine

## 2009-12-07 ENCOUNTER — Ambulatory Visit: Payer: Self-pay | Admitting: Internal Medicine

## 2009-12-16 ENCOUNTER — Ambulatory Visit: Payer: Self-pay | Admitting: Internal Medicine

## 2009-12-23 ENCOUNTER — Ambulatory Visit: Payer: Self-pay | Admitting: Internal Medicine

## 2010-01-03 ENCOUNTER — Ambulatory Visit: Payer: Self-pay | Admitting: Internal Medicine

## 2010-01-14 ENCOUNTER — Ambulatory Visit: Payer: Self-pay | Admitting: Internal Medicine

## 2010-01-21 ENCOUNTER — Ambulatory Visit: Payer: Self-pay | Admitting: Internal Medicine

## 2010-02-02 ENCOUNTER — Ambulatory Visit: Payer: Self-pay | Admitting: Internal Medicine

## 2010-02-14 ENCOUNTER — Ambulatory Visit: Payer: Self-pay | Admitting: Internal Medicine

## 2010-02-24 ENCOUNTER — Ambulatory Visit: Payer: Self-pay | Admitting: Internal Medicine

## 2010-03-03 ENCOUNTER — Ambulatory Visit: Payer: Self-pay | Admitting: Internal Medicine

## 2010-03-17 ENCOUNTER — Ambulatory Visit: Payer: Self-pay | Admitting: Internal Medicine

## 2010-04-01 ENCOUNTER — Ambulatory Visit: Payer: Self-pay | Admitting: Internal Medicine

## 2010-04-04 ENCOUNTER — Ambulatory Visit: Payer: Self-pay | Admitting: Internal Medicine

## 2010-04-14 ENCOUNTER — Ambulatory Visit: Payer: Self-pay | Admitting: Internal Medicine

## 2010-04-19 ENCOUNTER — Encounter (INDEPENDENT_AMBULATORY_CARE_PROVIDER_SITE_OTHER): Payer: Self-pay | Admitting: *Deleted

## 2010-04-19 ENCOUNTER — Ambulatory Visit: Payer: Self-pay | Admitting: Gastroenterology

## 2010-04-19 LAB — CONVERTED CEMR LAB: Tissue Transglutaminase Ab, IgA: 11.5 units (ref <20)

## 2010-04-20 ENCOUNTER — Ambulatory Visit: Payer: Self-pay | Admitting: Internal Medicine

## 2010-04-20 DIAGNOSIS — D509 Iron deficiency anemia, unspecified: Secondary | ICD-10-CM

## 2010-04-20 LAB — CONVERTED CEMR LAB
ALT: 15 units/L (ref 0–35)
AST: 25 units/L (ref 0–37)
Alkaline Phosphatase: 52 units/L (ref 39–117)
BUN: 17 mg/dL (ref 6–23)
Basophils Absolute: 0.1 10*3/uL (ref 0.0–0.1)
Basophils Relative: 1.2 % (ref 0.0–3.0)
Bilirubin, Direct: 0.1 mg/dL (ref 0.0–0.3)
Calcium: 8.9 mg/dL (ref 8.4–10.5)
Hemoglobin: 11.3 g/dL — ABNORMAL LOW (ref 12.0–15.0)
Lymphocytes Relative: 34 % (ref 12.0–46.0)
Lymphs Abs: 1.4 10*3/uL (ref 0.7–4.0)
MCV: 88.8 fL (ref 78.0–100.0)
Monocytes Absolute: 0.3 10*3/uL (ref 0.1–1.0)
Monocytes Relative: 6.8 % (ref 3.0–12.0)
Neutrophils Relative %: 55.9 % (ref 43.0–77.0)
Potassium: 4.3 meq/L (ref 3.5–5.1)
RBC: 3.73 M/uL — ABNORMAL LOW (ref 3.87–5.11)
Saturation Ratios: 17.9 % — ABNORMAL LOW (ref 20.0–50.0)
Sed Rate: 25 mm/hr — ABNORMAL HIGH (ref 0–22)
TSH: 1.08 microintl units/mL (ref 0.35–5.50)
Total Bilirubin: 0.5 mg/dL (ref 0.3–1.2)
Transferrin: 379.2 mg/dL — ABNORMAL HIGH (ref 212.0–360.0)
Vitamin B-12: 439 pg/mL (ref 211–911)

## 2010-04-21 ENCOUNTER — Encounter: Payer: Self-pay | Admitting: Gastroenterology

## 2010-04-29 ENCOUNTER — Ambulatory Visit: Payer: Self-pay | Admitting: Internal Medicine

## 2010-05-11 ENCOUNTER — Ambulatory Visit: Payer: Self-pay | Admitting: Gastroenterology

## 2010-05-13 ENCOUNTER — Encounter: Payer: Self-pay | Admitting: Gastroenterology

## 2010-05-13 ENCOUNTER — Ambulatory Visit: Payer: Self-pay | Admitting: Internal Medicine

## 2010-05-18 ENCOUNTER — Ambulatory Visit: Payer: Self-pay | Admitting: Internal Medicine

## 2010-06-07 ENCOUNTER — Telehealth: Payer: Self-pay | Admitting: Gastroenterology

## 2010-06-10 ENCOUNTER — Other Ambulatory Visit: Payer: Self-pay | Admitting: Gastroenterology

## 2010-06-10 ENCOUNTER — Ambulatory Visit
Admission: RE | Admit: 2010-06-10 | Discharge: 2010-06-10 | Payer: Self-pay | Source: Home / Self Care | Attending: Gastroenterology | Admitting: Gastroenterology

## 2010-06-10 LAB — CBC WITH DIFFERENTIAL/PLATELET
Basophils Absolute: 0 10*3/uL (ref 0.0–0.1)
Basophils Relative: 0.6 % (ref 0.0–3.0)
Eosinophils Absolute: 0.1 10*3/uL (ref 0.0–0.7)
Eosinophils Relative: 2.5 % (ref 0.0–5.0)
HCT: 34.7 % — ABNORMAL LOW (ref 36.0–46.0)
Hemoglobin: 11.8 g/dL — ABNORMAL LOW (ref 12.0–15.0)
Lymphocytes Relative: 36.6 % (ref 12.0–46.0)
Lymphs Abs: 1.5 10*3/uL (ref 0.7–4.0)
MCHC: 34 g/dL (ref 30.0–36.0)
MCV: 89.1 fl (ref 78.0–100.0)
Monocytes Absolute: 0.3 10*3/uL (ref 0.1–1.0)
Monocytes Relative: 7.1 % (ref 3.0–12.0)
Neutro Abs: 2.2 10*3/uL (ref 1.4–7.7)
Neutrophils Relative %: 53.2 % (ref 43.0–77.0)
Platelets: 224 10*3/uL (ref 150.0–400.0)
RBC: 3.89 Mil/uL (ref 3.87–5.11)
RDW: 14.6 % (ref 11.5–14.6)
WBC: 4.1 10*3/uL — ABNORMAL LOW (ref 4.5–10.5)

## 2010-06-10 LAB — FERRITIN: Ferritin: 8.6 ng/mL — ABNORMAL LOW (ref 10.0–291.0)

## 2010-06-10 LAB — IBC PANEL
Iron: 105 ug/dL (ref 42–145)
Saturation Ratios: 23.7 % (ref 20.0–50.0)
Transferrin: 317.1 mg/dL (ref 212.0–360.0)

## 2010-06-10 LAB — VITAMIN B12: Vitamin B-12: 551 pg/mL (ref 211–911)

## 2010-06-11 ENCOUNTER — Ambulatory Visit: Payer: Self-pay | Admitting: Internal Medicine

## 2010-06-13 LAB — FOLATE: Folate: >24.8 ng/mL (ref >5.9)

## 2010-06-17 ENCOUNTER — Ambulatory Visit: Payer: Self-pay | Admitting: Internal Medicine

## 2010-06-19 ENCOUNTER — Encounter: Payer: Self-pay | Admitting: Physical Medicine and Rehabilitation

## 2010-06-28 ENCOUNTER — Ambulatory Visit: Payer: Self-pay | Admitting: Internal Medicine

## 2010-06-30 NOTE — Progress Notes (Signed)
Summary: results  Phone Note Call from Patient Call back at [phone]   Caller: Patient Call For: young Summary of Call: pt returning call to [facility] [place] for results of xray Initial call taken by: Eugene Gavia,  June 02, 2009 8:35 AM  Follow-up for Phone Call        pt advised of cxr per append. Carron Curie CMA  June 02, 2009 9:08 AM

## 2010-06-30 NOTE — Progress Notes (Signed)
Summary: Questions  Phone Note Call from Patient Call back at Home Phone [phone] Call back at [phone]   Caller: Patient Call For: Dr. Jarold Motto Reason for Call: Talk to Nurse Summary of Call: Pt needs a refill on iron capsules, she wants to know if she should have blood work before getting a refill Initial call taken by: [place] Johnson,  June 07, 2010 2:49 PM  Follow-up for Phone Call        Lmom for patient to return my call. Graciella Freer RN  June 07, 2010 3:27 PM   Lmom at both numbers for patient to return our call. Graciella Freer RN  June 08, 2010 8:54 AM   Spoke with patient who stated she feels stonger and that her anemia has improved since starting the Integra. Before getting a refill on the Integra, patient wants to know if she should get a lab-CBC? Per 04/19/10 labs: HGB 11.3   HCT 33.1  Ferritin 6.1   Iron Sat 17.9%.  Graciella Freer RN  June 08, 2010 10:09 AM    Additional Follow-up for Phone Call Additional follow up Details #1::        CBC AND ANEMIA PROFILE Additional Follow-up by: Mardella Layman MD FACG,  June 08, 2010 10:48 AM    Additional Follow-up for Phone Call Additional follow up Details #2::    Notified patient Dr Jarold Motto ordered a CBC and Anemia Panel. Patient will come at her convenience. Follow-up by: Graciella Freer RN,  June 08, 2010 11:30 AM

## 2010-06-30 NOTE — Procedures (Signed)
Summary: Upper Endoscopy  Patient: [name] Note: All result statuses are Final unless otherwise noted.  Tests: (1) Upper Endoscopy (EGD)   EGD Upper Endoscopy       DONE     [facility]     [address]     [place], [place]  [address]           ENDOSCOPY PROCEDURE REPORT           PATIENT:  Holly, Ball  MR#:  [id number]     BIRTHDATE:  Dec 19, 1945, 64 yrs. old  GENDER:  female           ENDOSCOPIST:  Vania Rea. Jarold Motto, MD, [facility]     Referred by:           PROCEDURE DATE:  05/11/2010     PROCEDURE:  EGD with biopsy, 43239, Maloney Dilation of Esophagus     ASA CLASS:  Class II     INDICATIONS:  GERD, dysphagia           MEDICATIONS:   There was residual sedation effect present from     prior procedure., Versed 2 mg IV     TOPICAL ANESTHETIC:           DESCRIPTION OF PROCEDURE:   After the risks benefits and     alternatives of the procedure were thoroughly explained, informed     consent was obtained.  The LB [id number]-H180 [id number] endoscope was     introduced through the mouth and advanced to the second portion of     the duodenum, limited by retching and gagging. SEVERE GAG     REFLEX.DIFFICULT EXAM.  The instrument was slowly withdrawn as the     mucosa was fully examined.     <<PROCEDUREIMAGES>>           A Schatzki's ring was found at the gastroesophageal junction.     Dilation with maloney dilator 17mm TOLERATED WELL.SEE PICTURES.     Normal duodenal folds were noted. BX. DONE.  The stomach was     entered and closely examined. The antrum, angularis, and lesser     curvature were well visualized, including a retroflexed view of     the cardia and fundus. The stomach wall was normally distensable.     The scope passed easily through the pylorus into the duodenum.     Retroflexed views revealed Normal retroflexion.    The scope was then     withdrawn from the patient and the procedure completed.           COMPLICATIONS:  None           ENDOSCOPIC  IMPRESSION:     1) Schatzki's ring at the gastroesophageal junction     2) Normal duodenal folds     3) Normal stomach     RECOMMENDATIONS:     1) Await pathology results     2) Clear liquids until, then soft foods rest iof day. Resume     prior diet tomorrow.     3) continue current medications           REPEAT EXAM:  No           ______________________________     Vania Rea. Jarold Motto, MD, [facility]           CC:  Kirby Funk, MD           n.     Rosalie Doctor:   Vania Rea. Jarold Motto  at 05/11/2010 04:42 PM           [name], [id number]  Note: An exclamation mark (!) indicates a result that was not dispersed into the flowsheet. Document Creation Date: 05/11/2010 4:42 PM _______________________________________________________________________  (1) Order result status: Final Collection or observation date-time: 05/11/2010 16:37 Requested date-time:  Receipt date-time:  Reported date-time:  Referring Physician:   Ordering Physician: Sheryn Bison [phone]) Specimen Source:  Source: [name] Order Number: [id number] Lab site:

## 2010-06-30 NOTE — Letter (Signed)
Summary: Patient [facility] Biopsy Results  [facility]  [address] [place], [place] [address]   Phone: [phone]  Fax: [phone]        May 13, 2010 MRN: [id number]    LUXE CUADROS [address] [place], [place]  [address]    Dear Ms. Wollam,  I am pleased to inform you that the biopsies taken during your recent endoscopic examination did not show any evidence of cancer upon pathologic examination.  Additional information/recommendations:  __No further action is needed at this time.  Please follow-up with      your primary care physician for your other healthcare needs.  __ Please call [phone] to schedule a return visit to review      your condition.  x__ Continue with the treatment plan as outlined on the day of your      exam.  __ You should have a repeat endoscopic examination for this problem              in _ months/years.   Please call us if you are having persistent problems or have questions about your condition that have not been fully answered at this time.  Sincerely,  Mardella Layman MD [facility]  This letter has been electronically signed by your physician.  Appended Document: Patient Notice-Endo Biopsy Results letter mailed

## 2010-06-30 NOTE — Assessment & Plan Note (Signed)
Summary: ? sinus infection, cough/apc   Primary Provider/Referring Provider:  Kirby Funk, MD  CC:  Accute visit, Pt states she has sneezing, watery eyes, itchy throat, slight wheezing, cough and clear mucous, X3 days, denies SOB, and using mucinex & chlortreton past 2 days.  History of Present Illness:  07/21/08 Asthma, COPD, allergic rhinitis Has a viral syndrome with head and chest congestion, deep cough- still dry. Tight without much wheeze. Probably fever and gets cold. No GI upset. Had to miss her allergy vaccine this week- discussed.  11/25/08- Asthma, COPD, allergic rhinitis Today is cooler and she comments recent hot dry weather has been particularly hard. No recent infections. She asks about pneumovax- last was about 4 yrs ago. She asks about cxr- last report reviewed. Some occasional dry hacking cough and stable dyspnea with exertion. Denies chest pain, palpitation, nodes, bloody or purulent discharge, edema. sometimes cough and postnasal drip may wake her.   May 25, 2009- asthma, COPD, Allergic rhinitis had flu vax. No major problems since last here. Cold air may trigger a little cough. Only aware of dyspnea during a coughing spell.- not often. Denies palpitation, chest pain, sputum. Asks about her last CXR.  September 20, 2009- Asthma, COPD, Allergic rhinitis For past 3 days has been having wheeze, sneeze, copugh, watery eyes and itchy throat. She started Mucinex and chlortrimeton which is making her drowsy. She asks for neb and depo.   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  widowed no children retired no etoh Occupation: Retired Designer, industrial/product for [employer] Alcohol Use - no Daily Caffeine Use  Illicit Drug Use - no Patient gets regular exercise.  Risk Factors: Caffeine Use: 1 (03/31/2008) Exercise: yes (03/31/2008)  Risk Factors: Smoking Status: never (03/12/2007) Passive Smoke Exposure: yes (09/18/2007)  Review of Systems      See HPI       The patient complains of prolonged cough.  The patient denies anorexia, fever, weight loss, weight gain, vision loss, decreased hearing, hoarseness, chest pain, syncope, dyspnea on exertion, peripheral edema, headaches, hemoptysis, abdominal pain, and severe indigestion/heartburn.    Vital Signs:  Patient profile:   65 year old female Height:      62 inches Weight:      106.13 pounds O2 Sat:      96 % on Room air Pulse rate:   105 / minute BP sitting:   116 / 66  (right arm) Cuff size:   regular  O2 Flow:  Room air CC: Accute visit, Pt states she has sneezing, watery eyes, itchy throat, slight wheezing, cough and clear mucous, X3 days, denies SOB, using mucinex & chlortreton past 2 days Comments meds updated   Physical Exam  Additional Exam:  General: A/Ox3; pleasant and cooperative, NAD, comfortable appearing, slender SKIN: no rash, lesions NODES: no lymphadenopathy HEENT- Sniffing, turbinate edema  Mallampati IV- obscures posterior pharynx NECK: Supple w/ fair ROM, JVD- none, normal carotid impulses w/o bruits Thyroid-  CHEST: Quiet with wheezey cough HEART: RRR, no m/g/r heard ABDOMEN: Soft and nl;  JWJ:XBJY, nl pulses, no edema  NEURO: Grossly intact to  observation      Impression & Recommendations:  Problem # 1:  ALLERGIC RHINITIS (ICD-477.9)  Suggest less sedating alternative to chlortimeton. She continues allergy vaccine.  Problem # 2:  ASTHMA (ICD-493.90) Seasonal exacerbation likely pollen. Will give neb and depo.  Other Orders: Est. Patient Level III (78295) Depo- Medrol 80mg  (J1040) Admin of Therapeutic Inj  intramuscular or subcutaneous (62130) Nebulizer Tx (86578)  Patient Instructions: 1)  Please schedule a follow-up appointment in 6 months- earlier if needed 2)  Try otc allegra as an antihistamine. You can get Allegra-D at the pharmacy counter, with a decongestant already in it. Or you can get otc plain allegra, and add Sudafed-PE as an otc decongesztant. 3)  Neb xop 1.25 4)  depo 80     Medication Administration  Injection # 1:    Medication: Depo- Medrol 80mg     Diagnosis: ASTHMA (ICD-493.90)    Route: SQ    Site: RUOQ gluteus    Exp Date: 05/2010    Lot #: [id number]    Mfr: Pharmacia    Comments: Given by Carver Fila, SMA    Patient tolerated injection without complications    Given by: Reynaldo Minium CMA (September 20, 2009 3:14 PM)  Medication # 1:    Medication: Xopenex 1.25mg     Diagnosis: ASTHMA (ICD-493.90)    Dose: 1 vial    Route: inhaled    Exp Date: 01/2010    Lot #: [id number]    Mfr: sepracor    Comments: Given by Carver Fila, SMA    Patient tolerated medication without complications    Given by: Reynaldo Minium CMA (September 20, 2009 3:16 PM)  Orders Added: 1)  Est. Patient Level III [95284] 2)  Depo- Medrol 80mg  [J1040] 3)  Admin of Therapeutic Inj  intramuscular or subcutaneous [96372] 4)  Nebulizer Tx [13244]

## 2010-06-30 NOTE — Procedures (Signed)
Summary: Colonoscopy  Patient: [name] Note: All result statuses are Final unless otherwise noted.  Tests: (1) Colonoscopy (COL)   COL Colonoscopy           DONE     [facility]     [address]     [place], [place]  [address]           COLONOSCOPY PROCEDURE REPORT           PATIENT:  [name], [name]  MR#:  [id number]     BIRTHDATE:  10-12-1945, 64 yrs. old  GENDER:  female     ENDOSCOPIST:  Vania Rea. Jarold Motto, MD, [facility]     REF. BY:     PROCEDURE DATE:  05/11/2010     PROCEDURE:  Colonoscopy with biopsy     ASA CLASS:  Class II     INDICATIONS:  Colorectal cancer screening, average risk,     unexplained diarrhea     MEDICATIONS:   Fentanyl 50 mcg IV, Versed 5 mg IV           DESCRIPTION OF PROCEDURE:   After the risks benefits and     alternatives of the procedure were thoroughly explained, informed     consent was obtained.  Digital rectal exam was performed and     revealed no abnormalities.   The LB [id number] endoscope     was introduced through the anus and advanced to the cecum, which     was identified by both the appendix and ileocecal valve, limited     by a redundant colon.    The quality of the prep was excellent,     using MoviPrep.  The instrument was then slowly withdrawn as the     colon was fully examined.     <<PROCEDUREIMAGES>>           FINDINGS:  No polyps or cancers were seen.  This was otherwise a     normal examination of the colon. RANDOM BIOPSIES EVERY 10 CM.     DONE.   Retroflexed views in the rectum revealed no abnormalities.     The scope was then withdrawn from the patient and the procedure     completed.           COMPLICATIONS:  None     ENDOSCOPIC IMPRESSION:     1) No polyps or cancers     2) Otherwise normal examination     R/O MICROSCOPIC/COLLAGENOUS COLITIS.PROBABLE IBS.     RECOMMENDATIONS:     1) Await pathology results     2) Continue current medications     3) Repeat colonoscopy 10 years.     4) Upper  Endoscopy     REPEAT EXAM:  No           ______________________________     Vania Rea. Jarold Motto, MD, [facility]           CC:  Kirby Funk, MD           n.     Rosalie Doctor:   Vania Rea. Patterson at 05/11/2010 04:32 PM           [name], [id number]  Note: An exclamation mark (!) indicates a result that was not dispersed into the flowsheet. Document Creation Date: 05/11/2010 4:32 PM _______________________________________________________________________  (1) Order result status: Final Collection or observation date-time: 05/11/2010 16:27 Requested date-time:  Receipt date-time:  Reported date-time:  Referring Physician:   Ordering Physician: Sheryn Bison (  [id number]) Specimen Source:  Source: Launa Grill Order Number: [id number] Lab site:   Appended Document: Colonoscopy     Procedures Next Due Date:    Colonoscopy: 04/2020

## 2010-06-30 NOTE — Letter (Signed)
Summary: Patient Notice- Colon Biospy Results  [name]  [address] [place], [place] [address]   Phone: [phone]  Fax: [phone]        May 13, 2010 MRN: [id number]    [name] [address] [place], [place]  [address]    Dear Ms. Lamore,  I am pleased to inform you that the biopsies taken during your recent colonoscopy did not show any evidence of cancer upon pathologic examination.  Additional information/recommendations:  __No further action is needed at this time.  Please follow-up with      your primary care physician for your other healthcare needs.  __Please call [phone] to schedule a return visit to review      your condition.  _x_Continue with the treatment plan as outlined on the day of your      exam.  __You should have a repeat colonoscopy examination in 10 years.  Please call us if you are having persistent problems or have questions about your condition that have not been fully answered at this time.  Sincerely,  Mardella Layman MD [facility]   This letter has been electronically signed by your physician.   Appended Document: Patient Notice- Colon Biospy Results letter mailed

## 2010-06-30 NOTE — Assessment & Plan Note (Signed)
Summary: ROV/ MBW   Primary Provider/Referring Provider:  Kirby Funk, MD  CC:  Follow up with PFTs.  History of Present Illness:  11/25/08- Asthma, COPD, allergic rhinitis Today is cooler and she comments recent hot dry weather has been particularly hard. No recent infections. She asks about pneumovax- last was about 4 yrs ago. She asks about cxr- last report reviewed. Some occasional dry hacking cough and stable dyspnea with exertion. Denies chest pain, palpitation, nodes, bloody or purulent discharge, edema. sometimes cough and postnasal drip may wake her.   May 25, 2009- asthma, COPD, Allergic rhinitis had flu vax. No major problems since last here. Cold air may trigger a little cough. Only aware of dyspnea during a coughing spell.- not often. Denies palpitation, chest pain, sputum. Asks about her last CXR.  September 20, 2009- Asthma, COPD, Allergic rhinitis For past 3 days has been having wheeze, sneeze, copugh, watery eyes and itchy throat. She started Mucinex and chlortrimeton which is making her drowsy. She asks for neb and depo.  February 14, 2010- Asthma, COPD, Allergic rhinitis She notices more cough and sinus drip blamed on ragweed currently. Admits it isn't bad, but the mucus in throat is annoying. Occasional splinting twinge at left lower anterior costal margin with exertion. PFT- FEV1 1.66/ 83%. FEV1/FVC 0.70, signif response to dilator, airtrapping/ RV 152%, Nl DLCO.    Preventive Screening-Counseling & Management  Alcohol-Tobacco     Smoking Status: quit     Passive Smoke Counseling: to avoid passive smoke exposure  Comments: Never smoked, but grew up in smokinng home and was married to a smoker.  Current Medications (verified): 1)  Menostar 14 Mcg/24hr  Ptwk (Estradiol) .... Apply Once Weekly 2)  Allergy Vaccine  1:10 Gh  Tested 4/06 .... Once Weekly 3)  Pravachol 20 Mg Tabs (Pravastatin Sodium) .... Take 1 By Mouth Once Daily 4)  Aciphex 20 Mg  Tbec  (Rabeprazole Sodium) .... Take 1 Each Day 30 Minutes Before Meals 5)  Reclast 5 Mg/161ml Soln (Zoledronic Acid) .... Injection Once A Year 6)  Multivitamins  Tabs (Multiple Vitamin) .... Take 1 By Mouth Once Daily 7)  Vitamin D 1000 Unit Tabs (Cholecalciferol) .... Take 4 By Mouth  Once Daily 8)  Vitamin E 400 Unit Caps (Vitamin E) .... Take 1 By Mouth Once Daily 9)  Calcium 600 Mg Tabs (Calcium) .... Take 3 By Mouth Once Daily  Allergies (verified): 1)  Codeine  Past History:  Past Surgical History: Last updated: 03/31/2008 Hysterectomy Tonsillectomy Foot surgery  Family History: Last updated: 12/26/2007 allergies in siblings asthma in grandfather mother living at 34 with no health problems father died at 20 from pneumonia 2 brothers living sister is a diabetic  Social History: Last updated: 07/21/2008 Positive history of passive tobacco smoke exposure.  widowed no children retired no etoh Occupation: Retired Designer, industrial/product for [employer] Alcohol Use - no Daily Caffeine Use  Illicit Drug Use - no Patient gets regular exercise.  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         Occasional "stitch" in her side.  Vital Signs:  Patient profile:   65 year old female Height:      62 inches Weight:      110 pounds BMI:     20.19 O2 Sat:      99 % on Room air Pulse rate:   101 / minute BP sitting:   130 / 80  (left arm)  Vitals Entered By: Renold Genta RCP, LPN (February 14, 2010 1:28 PM)  O2 Sat at Rest %:  99% O2 Flow:  Room air CC: Follow up with PFTs Comments Medications reviewed with patient Renold Genta RCP, LPN  February 14, 2010 1:29 PM    Physical Exam  Additional Exam:  General: A/Ox3; pleasant and cooperative, NAD, comfortable appearing, slender SKIN: no rash, lesions NODES: no lymphadenopathy HEENT- Sniffing, turbinate edema  Mallampati IV- obscures posterior pharynx NECK: Supple w/ fair ROM, JVD- none, normal carotid impulses w/o bruits Thyroid-  CHEST:Clear to P&A HEART: RRR, no m/g/r heard ABDOMEN: Soft and nl;  ZOX:WRUE, nl pulses, no edema  NEURO: Grossly intact to observation      Impression & Recommendations:  Problem # 1:  ALLERGIC RHINITIS (ICD-477.9)  Bothersme postnasaldcrip is partly related to seasonal allergy and partly irritant/ vasomotor. We will  continue allergy vaccine. She would like to try an antihistamine nasal spray.  Problem # 2:  ASTHMA (ICD-493.90) She has a mild COPD and mild asthma/ reversible component, with hx of allergy and of sustained past passive smoke exposure. There is some residual response to bronchodialtor. She has no inhaler currently so I will start with giving her a sample rescue inhaler.  Problem # 3:  GERD (ICD-530.81) I spoke with her aout reflucx prevention and the relation of reflux to lung disease. Her updated medication list for this problem includes:    Aciphex 20 Mg Tbec (Rabeprazole sodium) .Marland Kitchen... Take 1 each day 30 minutes before meals  Other  Orders: Est. Patient Level IV (45409)  Patient Instructions: 1)  Please schedule a follow-up appointment in 3 months. 2)  continue your exercise class and look for opportunities to be active so you maintain your endurance. 3)  Sample Astepro nasal spray: 1-2 sprays each nostril up to twice daily if needed 4)  Sample rescue bronchodilator inhaler: 2 puffs up to 4 x daily if needed. 5)  Flu vax

## 2010-06-30 NOTE — Miscellaneous (Signed)
Summary: Injection Record/[facility]  Injection Record/ Allergy   Imported By: Sherian Rein 10/19/2009 14:05:52  _____________________________________________________________________  External Attachment:    Type:   Image     Comment:   External Document

## 2010-06-30 NOTE — Miscellaneous (Signed)
Summary: Injection Record / [facility]    Injection Record / [facility]    Imported By: Lennie Odor 01/28/2010 11:00:31  _____________________________________________________________________  External Attachment:    Type:   Image     Comment:   External Document

## 2010-06-30 NOTE — Assessment & Plan Note (Signed)
Summary: YEARLY F-UP/YF   History of Present Illness Visit Type: Follow-up Visit Primary GI MD: Sheryn Bison MD FACP FAGA Primary Provider: Kirby Funk, MD Chief Complaint: Yearly follow up visit, Need Aciphex refilled pt is willing to try new PPI, Pt wants to be referred for Food Allergies History of Present Illness:   [name] with chronic acid reflux and IBS. Her complaints today are of worsening acid reflux despite AcipHex 20 mg a day, abdominal gas, Lodine, anorexia weight loss. She has known lactose intolerance, chronic anemia, and multiple food intolerances to shellfish and other possible wheat products. She does have chronic osteoporosis and is on calcium and vitamin D. She has allergic rhinitis and is on allergy shots per Dr. Maple Hudson. She gets Reclast injections yearly. She denies use of NSAIDs, alcohol, or cigarettes. Family history is noncontributory.  She's had mild weight loss but no other systemic complaints. Review of her chart showed a previous workup for celiac disease.   GI Review of Systems    Reports acid reflux.      Denies abdominal pain, belching, bloating, chest pain, dysphagia with liquids, dysphagia with solids, heartburn, loss of appetite, nausea, vomiting, vomiting blood, weight loss, and  weight gain.        Denies anal fissure, black tarry stools, change in bowel habit, constipation, diarrhea, diverticulosis, fecal incontinence, heme positive stool, hemorrhoids, irritable bowel syndrome, jaundice, light color stool, liver problems, rectal bleeding, and  rectal pain.    Current Medications (verified): 1)  Allergy Vaccine  1:10 Gh  Tested 4/06 .... Once Weekly 2)  Pravachol 20 Mg Tabs (Pravastatin Sodium) .... Take 1 By Mouth Once Daily 3)  Aciphex 20 Mg  Tbec (Rabeprazole Sodium) .... Take 1 Each Day 30 Minutes Before Meals 4)  Reclast 5 Mg/160ml Soln (Zoledronic Acid) .... Injection Once A Year 5)  Multivitamins  Tabs (Multiple Vitamin)  .... Take 1 By Mouth Once Daily 6)  Vitamin D 1000 Unit Tabs (Cholecalciferol) .... Take 4 By Mouth  Once Daily 7)  Vitamin E 400 Unit Caps (Vitamin E) .... Take 1 By Mouth Once Daily 8)  Calcium 600 Mg Tabs (Calcium) .... Take 3 By Mouth Once Daily  Allergies (verified): 1)  Codeine  Past History:  Family History: Last updated: 12/26/2007 allergies in siblings asthma in grandfather mother living at 66 with no health problems father died at 103 from pneumonia 2 brothers living sister is a diabetic  Social History: Last updated: 07/21/2008 Positive history of passive tobacco smoke exposure.  widowed no children retired no etoh Occupation: Retired Designer, industrial/product for [employer] Alcohol Use - no Daily Caffeine Use  Illicit Drug Use - no Patient gets regular exercise.  Past medical, surgical, family and social histories (including risk factors) reviewed for relevance to current acute and chronic problems.  Past Medical History: Reviewed history from 02/14/2010 and no changes required. 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 Hysterectomy Tonsillectomy Foot surgery  Family History: Reviewed history from 12/26/2007 and no changes required. allergies in siblings asthma in grandfather mother living at 40 with no health problems father died at 31 from pneumonia 2 brothers living  sister is a diabetic  Social History: Reviewed history from 07/21/2008 and no changes required. Positive history of passive tobacco smoke exposure.  widowed no children retired no  etoh Occupation: Retired Designer, industrial/product for [employer] Alcohol Use - no Daily Caffeine Use  Illicit Drug Use - no Patient gets regular exercise.  Review of Systems       The patient complains of anemia and fatigue.  The patient denies allergy/sinus, anxiety-new, arthritis/joint pain, back pain, blood in urine, breast changes/lumps, change in vision, confusion, cough, coughing up blood, depression-new, fainting, fever, headaches-new, hearing problems, heart murmur, heart rhythm changes, itching, menstrual pain, muscle pains/cramps, night sweats, nosebleeds, pregnancy symptoms, shortness of breath, skin rash, sleeping problems, sore throat, swelling of feet/legs, swollen lymph glands, thirst - excessive , urination - excessive , urination changes/pain, urine leakage, vision changes, and voice change.    Vital Signs:  Patient profile:   65 year old female Height:      62 inches Weight:      110 pounds BMI:     20.19 BSA:     1.48 Pulse rate:   80 / minute Pulse rhythm:   regular BP sitting:   122 / 74  (left arm)  Vitals Entered By: Merri Ray CMA Duncan Dull) (April 19, 2010 11:06 AM)  Physical Exam  General:  Well developed, well nourished, no acute distress.healthy appearing.   Head:  Normocephalic and atraumatic. Eyes:  PERRLA, no icterus.exam deferred to patient's ophthalmologist.   Lungs:  Clear throughout to auscultation. Heart:  Regular rate and rhythm; no murmurs, rubs,  or bruits. Abdomen:  Soft, nontender and nondistended. No masses, hepatosplenomegaly or hernias noted. Normal bowel sounds. Rectal:  deferred until time of colonoscopy.   Msk:  Symmetrical with no gross deformities. Normal posture. Extremities:  No clubbing, cyanosis, edema or deformities noted. Neurologic:  Alert and  oriented x4;  grossly normal neurologically. Cervical Nodes:  No significant cervical adenopathy. Psych:  Alert and cooperative. Normal mood and affect.   Impression &  Recommendations:  Problem # 1:  DIARRHEA (ICD-787.91) Assessment Unchanged Probable IBS, possible element of bacterial overgrowth syndrome, rule out chronic malabsorption and celiac disease. Labs have been ordered and I'll repeat her endoscopy and obtain small bowel biopsy. Trial of probiotics suggested.  Problem # 2:  ABDOMINAL PAIN, EPIGASTRIC (ICD-789.06) Assessment: Unchanged Continue PPI therapy and repeat endoscopy.  Problem # 3:  COLONIC POLYPS, HX OF (ICD-V12.72) Assessment: Unchanged followup colonoscopy at her convenience. Orders: TLB-IgA (Immunoglobulin A) (82784-IGA) T-Pancreatic Fecal Elastase ([address]) TLB-Sedimentation Rate (ESR) (85652-ESR) TLB-CBC Platelet - w/Differential (85025-CBCD) TLB-BMP (Basic Metabolic Panel-BMET) (80048-METABOL) TLB-Hepatic/Liver Function Pnl (80076-HEPATIC) TLB-TSH (Thyroid Stimulating Hormone) (84443-TSH) TLB-B12, Serum-Total ONLY (60454-U98) TLB-Ferritin (82728-FER) TLB-Folic Acid (Folate) (82746-FOL) TLB-IBC Pnl (Iron/FE;Transferrin) (83550-IBC) TLB-Magnesium (Mg) (83735-MG) T-Beta Carotene [phone]) T-Sprue Panel (Celiac Disease Aby Eval) (83516x3/[address]) Colon/Endo (Colon/Endo)  Problem # 4:  COPD (ICD-496) Assessment: Unchanged Continued followup with Dr. Maple Hudson in pulmonary as needed  Patient Instructions: 1)  Copy sent to : Kirby Funk, MD 2)  [facility] Patient Information Guide given to patient.  3)  Colonoscopy and Flexible Sigmoidoscopy brochure given.  4)  Upper Endoscopy brochure given.  5)  Please go to the basement today for your labs.  6)  Your prescription(s) have been sent to you pharmacy.  7)  Your procedure has been scheduled for 05/11/2010, please follow the seperate instructions.  8)  The medication list was reviewed and reconciled.  All changed / newly prescribed medications were explained.  A complete medication list was provided to the patient / caregiver. Prescriptions: MOVIPREP 100  GM  SOLR (PEG-KCL-NACL-NASULF-NA ASC-C) As per prep instructions.  #1 x 0   Entered by:   Harlow Mares CMA (AAMA)   Authorized by:   Mardella Layman MD [facility]   Signed by:   Harlow Mares CMA (AAMA) on 04/19/2010   Method used:   Electronically to        [facility]  [phone]* (retail)       [address] [place], [place]  [address]       Ph: [phone] or [phone]       Fax: [phone]   RxID:   [id number] ACIPHEX 20 MG  TBEC (RABEPRAZOLE SODIUM) Take 1 each day 30 minutes before meals  #90 x 3   Entered by:   Harlow Mares CMA (AAMA)   Authorized by:   Mardella Layman MD [facility]   Signed by:   Harlow Mares CMA (AAMA) on 04/19/2010   Method used:   Electronically to        [facility]  [phone]* (retail)       [address] [place], [place]  [address]       Ph: [phone] or [phone]       Fax: [phone]   RxID:   [id number]

## 2010-06-30 NOTE — Miscellaneous (Signed)
Summary: Injection Record/[facility]  Injection Record/[facility]   Imported By: Lanelle Bal 09/29/2009 14:23:28  _____________________________________________________________________  External Attachment:    Type:   Image     Comment:   External Document

## 2010-06-30 NOTE — Miscellaneous (Signed)
Summary: Injection record  Injection record   Imported By: Lester Dillsboro 06/10/2010 11:44:41  _____________________________________________________________________  External Attachment:    Type:   Image     Comment:   External Document

## 2010-06-30 NOTE — Miscellaneous (Signed)
Summary: Orders Update pft charges  Clinical Lists Changes  Orders: Added new Service order of Carbon Monoxide diffusing w/capacity (94720) - Signed Added new Service order of Lung Volumes (94240) - Signed Added new Service order of Spirometry (Pre & Post) (94060) - Signed 

## 2010-06-30 NOTE — Assessment & Plan Note (Signed)
Summary: rov 3 months///kp   Copy to:  n/a Primary Provider/Referring Provider:  Kirby Funk, MD  CC:  3 month follow up visit-allergies..  History of Present Illness: May 25, 2009- asthma, COPD, Allergic rhinitis had flu vax. No major problems since last here. Cold air may trigger a little cough. Only aware of dyspnea during a coughing spell.- not often. Denies palpitation, chest pain, sputum. Asks about her last CXR.  September 20, 2009- Asthma, COPD, Allergic rhinitis For past 3 days has been having wheeze, sneeze, copugh, watery eyes and itchy throat. She started Mucinex and chlortrimeton which is making her drowsy. She asks for neb and depo.  February 14, 2010- Asthma, COPD, Allergic rhinitis She notices more cough and sinus drip blamed on ragweed currently. Admits it isn't bad, but the mucus in throat is annoying. Occasional splinting twinge at left lower anterior costal margin with exertion. PFT- FEV1 1.66/ 83%. FEV1/FVC 0.70, signif response to dilator, airtrapping/ RV 152%, Nl DLCO.  May 18, 2010-  Asthma, COPD, Allergic rhinitis Nurse-CC: 3 month follow up visit-allergies. Breathing well. Since last here was found to  be gluten intolerant by Dr Jarold Motto. She thinks she feels better as she goes gluten free.  Doing well on allergy vaccine at 1:10. Still aware of postnasal drip. Cough aggravates reflux and vice versa. Going to yoga classes.     Asthma History    Initial Asthma Severity Rating:    Age range: 12+ years    Symptoms: 0-2 days/week    Nighttime Awakenings: 0-2/month    Interferes w/ normal activity: no limitations    SABA use (not for EIB): 0-2 days/week    Asthma Severity Assessment: Intermittent   Preventive Screening-Counseling & Management  Alcohol-Tobacco     Smoking Status: quit     Passive Smoke Exposure: yes     Passive Smoke Counseling: to avoid passive smoke exposure  Current Medications (verified): 1)  Allergy Vaccine  1:10 Gh  Tested  4/06 .... Once Weekly 2)  Pravachol 20 Mg Tabs (Pravastatin Sodium) .... Take 1 By Mouth Once Daily 3)  Aciphex 20 Mg  Tbec (Rabeprazole Sodium) .... Take 1 Each Day 30 Minutes Before Meals 4)  Reclast 5 Mg/158ml Soln (Zoledronic Acid) .... Injection Once A Year 5)  Multivitamins  Tabs (Multiple Vitamin) .... Take 1 By Mouth Once Daily 6)  Vitamin D 1000 Unit Tabs (Cholecalciferol) .... Take 4 By Mouth  Once Daily 7)  Vitamin E 400 Unit Caps (Vitamin E) .... Take 1 By Mouth Once Daily 8)  Calcium 600 Mg Tabs (Calcium) .... Take 3 By Mouth Once Daily 9)  Align  Caps (Probiotic Product) .... Take One By Mouth Once Daily, Buy Otc 10)  Integra Plus  Caps (Fefum-Fepoly-Fa-B Cmp-C-Biot) .... 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  widowed no children retired no etoh Occupation: Retired Designer, industrial/product for [employer] Alcohol Use - no Daily Caffeine Use  Illicit Drug Use - no Patient gets regular exercise.  Risk Factors: Caffeine Use: 1 (03/31/2008) Exercise: yes (03/31/2008)  Risk Factors: Smoking Status: quit (05/18/2010) Passive Smoke  Exposure: yes (05/18/2010)  Review of Systems      See HPI       The patient complains of shortness of breath with activity, non-productive cough, and acid heartburn.  The patient denies shortness of breath at rest, productive cough, coughing up blood, chest pain, irregular heartbeats, indigestion, loss of appetite, weight change, abdominal pain, difficulty swallowing, sore throat, tooth/dental problems, headaches, nasal congestion/difficulty breathing through nose, sneezing, itching, hand/feet swelling, rash, and fever.    Vital Signs:  Patient profile:   65 year old female Height:      62 inches Weight:      110.38 pounds BMI:     20.26 O2 Sat:      99 % on Room air Pulse rate:   83 / minute BP sitting:   124 / 78  (left arm) Cuff size:   regular  Vitals Entered By: Reynaldo Minium CMA (May 18, 2010 10:50 AM)  O2 Flow:  Room air CC: 3 month follow up visit-allergies.   Physical Exam  Additional Exam:  General: A/Ox3; pleasant and cooperative, NAD, comfortable appearing, slender SKIN: no rash, lesions NODES: no lymphadenopathy HEENT- Sniffing, turbinate edema  Mallampati IV- obscures posterior pharynx NECK: Supple w/ fair ROM, JVD- none, normal carotid impulses w/o bruits Thyroid-  CHEST:Clear to P&A HEART: RRR, no m/g/r heard ABDOMEN: Soft and nl;  YNW:GNFA, nl pulses, no edema  NEURO: Grossly intact to observation      Impression & Recommendations:  Problem # 1:  ALLERGIC RHINITIS (ICD-477.9)  Before trying other prescription approaches, I explained and asked her to try a Neti pot for rinsing. We went through the technique .  Problem # 2:  ASTHMA (ICD-493.90) Good control. She has avoided recent winter colds.  There is a fixed obstructive component, probably from her former husband's smoking.   Other Orders: Est. Patient Level III (21308)  Patient Instructions: 1)  Please schedule a follow-up appointment in 6 months. 2)  Continue the presdent treatment as  we watch season changes. 3)  Consider trying the Neti pot approach to rinse your nasal passages from time to  time.

## 2010-06-30 NOTE — Letter (Signed)
Summary: [facility] Instructions  [facility]  [address] [place], [place] [address]   Phone: [phone]  Fax: [phone]       [name]    [birth year]    MRN: [id number]        Procedure Day /Date: 05/11/2010 Wednesday       Arrival Time: 2:30pm     Procedure Time: 3:30pm     Location of Procedure:                    X  [facility] (4th Floor)                        PREPARATION FOR COLONOSCOPY WITH MOVIPREP   Starting 5 days prior to your procedure 05/06/2010 do not eat nuts, seeds, popcorn, corn, beans, peas,  salads, or any raw vegetables.  Do not take any fiber supplements (e.g. Metamucil, Citrucel, and Benefiber).  THE DAY BEFORE YOUR PROCEDURE         DATE: 05/10/2010  DAY: Tuesday  1.  Drink clear liquids the entire day-NO SOLID FOOD  2.  Do not drink anything colored red or purple.  Avoid juices with pulp.  No orange juice.  3.  Drink at least 64 oz. (8 glasses) of fluid/clear liquids during the day to prevent dehydration and help the prep work efficiently.  CLEAR LIQUIDS INCLUDE: Water Jello Ice Popsicles Tea (sugar ok, no milk/cream) Powdered fruit flavored drinks Coffee (sugar ok, no milk/cream) Gatorade Juice: apple, white grape, white cranberry  Lemonade Clear bullion, consomm, broth Carbonated beverages (any kind) Strained chicken noodle soup Hard Candy                             4.  In the morning, mix first dose of MoviPrep solution:    Empty 1 Pouch A and 1 Pouch B into the disposable container    Add lukewarm drinking water to the top line of the container. Mix to dissolve    Refrigerate (mixed solution should be used within 24 hrs)  5.  Begin drinking the prep at 5:00 p.m. The MoviPrep container is divided by 4 marks.   Every 15 minutes drink the solution down to the next mark (approximately 8 oz) until the full liter is complete.   6.  Follow completed prep with 16 oz of clear liquid of your choice  (Nothing red or purple).  Continue to drink clear liquids until bedtime.  7.  Before going to bed, mix second dose of MoviPrep solution:    Empty 1 Pouch A and 1 Pouch B into the disposable container    Add lukewarm drinking water to the top line of the container. Mix to dissolve    Refrigerate  THE DAY OF YOUR PROCEDURE      DATE: 05/11/2010  DAY: Wednesday  Beginning at 10:30am (5 hours before procedure):         1. Every 15 minutes, drink the solution down to the next mark (approx 8 oz) until the full liter is complete.  2. Follow completed prep with 16 oz. of clear liquid of your choice.    3. You may drink clear liquids until 1:30pm (2 HOURS BEFORE PROCEDURE).   MEDICATION INSTRUCTIONS  Unless otherwise instructed, you should take regular prescription medications with a small sip of water   as early as possible the morning of your procedure.  OTHER INSTRUCTIONS  You will need a responsible adult at least 65 years of age to accompany you and drive you home.   This person must remain in the waiting room during your procedure.  Wear loose fitting clothing that is easily removed.  Leave jewelry and other valuables at home.  However, you may wish to bring a book to read or  an iPod/MP3 player to listen to music as you wait for your procedure to start.  Remove all body piercing jewelry and leave at home.  Total time from sign-in until discharge is approximately 2-3 hours.  You should go home directly after your procedure and rest.  You can resume normal activities the  day after your procedure.  The day of your procedure you should not:   Drive   Make legal decisions   Operate machinery   Drink alcohol   Return to work  You will receive specific instructions about eating, activities and medications before you leave.    The above instructions have been reviewed and explained to me by   _______________________    I fully understand and can verbalize  these instructions _____________________________ Date _________

## 2010-07-07 DIAGNOSIS — J301 Allergic rhinitis due to pollen: Secondary | ICD-10-CM

## 2010-07-14 ENCOUNTER — Telehealth: Payer: Self-pay | Admitting: Gastroenterology

## 2010-07-14 ENCOUNTER — Encounter: Payer: 59 | Admitting: Hematology and Oncology

## 2010-07-18 ENCOUNTER — Ambulatory Visit (INDEPENDENT_AMBULATORY_CARE_PROVIDER_SITE_OTHER): Payer: 59

## 2010-07-18 ENCOUNTER — Encounter (INDEPENDENT_AMBULATORY_CARE_PROVIDER_SITE_OTHER): Payer: Self-pay | Admitting: *Deleted

## 2010-07-18 ENCOUNTER — Other Ambulatory Visit: Payer: 59

## 2010-07-18 ENCOUNTER — Other Ambulatory Visit: Payer: Self-pay | Admitting: Gastroenterology

## 2010-07-18 DIAGNOSIS — J301 Allergic rhinitis due to pollen: Secondary | ICD-10-CM

## 2010-07-18 DIAGNOSIS — D509 Iron deficiency anemia, unspecified: Secondary | ICD-10-CM

## 2010-07-18 LAB — FERRITIN: Ferritin: 15.7 ng/mL (ref 10.0–291.0)

## 2010-07-20 NOTE — Progress Notes (Signed)
Summary: ? re appt  Phone Note Call from Patient Call back at Home Phone [phone]   Caller: Patient Call For: Dr Jarold Motto Reason for Call: Talk to Nurse Summary of Call: Patient is scheduled to see Dr Jarold Motto on 2-28 and wants to know if she needs labs to check her iron since it's for anemia f.u. Initial call taken by: Tawni Levy,  July 14, 2010 12:09 PM  Follow-up for Phone Call        Dr Jarold Motto, ENDO/COLON on 05/11/10, Ferritin 8.6 on 06/10/10. Do you want labs prior to 07/26/10 visit? Thanks. Graciella Freer RN  July 14, 2010 1:47 PM   Additional Follow-up for Phone Call Additional follow up Details #1::        ferritin Additional Follow-up by: Mardella Layman MD FACG,  July 15, 2010 12:31 PM    Additional Follow-up for Phone Call Additional follow up Details #2::    [facility] for patient that I scheduled her for a lab beginning 07/18/10. Follow-up by: Graciella Freer RN,  July 15, 2010 1:19 PM

## 2010-07-26 ENCOUNTER — Ambulatory Visit (INDEPENDENT_AMBULATORY_CARE_PROVIDER_SITE_OTHER): Payer: 59 | Admitting: Gastroenterology

## 2010-07-26 ENCOUNTER — Encounter: Payer: Self-pay | Admitting: Gastroenterology

## 2010-07-26 ENCOUNTER — Other Ambulatory Visit: Payer: Self-pay | Admitting: Gastroenterology

## 2010-07-26 ENCOUNTER — Other Ambulatory Visit: Payer: 59

## 2010-07-26 DIAGNOSIS — D509 Iron deficiency anemia, unspecified: Secondary | ICD-10-CM

## 2010-07-26 DIAGNOSIS — R197 Diarrhea, unspecified: Secondary | ICD-10-CM

## 2010-07-26 DIAGNOSIS — K901 Tropical sprue: Secondary | ICD-10-CM

## 2010-07-26 LAB — IBC PANEL
Iron: 62 ug/dL (ref 42–145)
Saturation Ratios: 14.2 % — ABNORMAL LOW (ref 20.0–50.0)
Transferrin: 311.7 mg/dL (ref 212.0–360.0)

## 2010-07-26 LAB — VITAMIN B12: Vitamin B-12: 633 pg/mL (ref 211–911)

## 2010-07-26 LAB — FOLATE: Folate: >24.8 ng/mL (ref >5.9)

## 2010-07-26 LAB — FERRITIN: Ferritin: 12.2 ng/mL (ref 10.0–291.0)

## 2010-08-04 NOTE — Assessment & Plan Note (Signed)
Summary: FU Anemia   History of Present Illness Visit Type: Follow-up Visit Primary GI MD: Sheryn Bison MD FACP FAGA Primary Provider: Shary Decamp, MD Chief Complaint: Anemia , Integra plus causing diarrhea History of Present Illness:   Frequent loose stools with gas and bloating and vague abdominal discomfort continues. Workup has otherwise been unremarkable including colonoscopy and mucosal biopsies. She does have chronic calcium and iron deficiency, small bowel biopsy was negative and celiac serologies are negative. She had minimal iron replacement for 3 months. The patient also takes AcipHex 20 mg a day for GERD   GI Review of Systems      Denies abdominal pain, acid reflux, belching, bloating, chest pain, dysphagia with liquids, dysphagia with solids, heartburn, loss of appetite, nausea, vomiting, vomiting blood, weight loss, and  weight gain.      Reports diarrhea.     Denies anal fissure, black tarry stools, change in bowel habit, constipation, diverticulosis, fecal incontinence, heme positive stool, hemorrhoids, irritable bowel syndrome, jaundice, light color stool, liver problems, rectal bleeding, and  rectal pain.    Current Medications (verified): 1)  Allergy Vaccine  1:10 Gh  Tested 4/06 .... Once Weekly 2)  Aciphex 20 Mg  Tbec (Rabeprazole Sodium) .... Take 1 Each Day 30 Minutes Before Meals 3)  Reclast 5 Mg/172ml Soln (Zoledronic Acid) .... Injection Once A Year 4)  Multivitamins  Tabs (Multiple Vitamin) .... Take 1 By Mouth Once Daily 5)  Vitamin D 1000 Unit Tabs (Cholecalciferol) .... Take 4 By Mouth  Once Daily 6)  Vitamin E 400 Unit Caps (Vitamin E) .... Take 1 By Mouth Once Daily 7)  Calcium-Vitamin D 500-200 Mg-Unit Tabs (Calcium Carbonate-Vitamin D) .... Take  3  Tablets By Mouth Once Daily 8)  Integra Plus  Caps (Fefum-Fepoly-Fa-B Cmp-C-Biot) .... Take One By Mouth Once Daily  Allergies (verified): 1)  Codeine  Past History:  Past medical, surgical,  family and social histories (including risk factors) reviewed for relevance to current acute and chronic problems.  Past Medical History: Reviewed history from 02/14/2010 and no changes required. 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 Hysterectomy Tonsillectomy Foot surgery  Family History: Reviewed history from 12/26/2007 and no changes required. allergies in siblings asthma in grandfather mother living at 79 with no health problems father died at 16 from pneumonia 2 brothers living sister is a diabetic  Social History: Reviewed history from 07/21/2008 and no changes required. Positive history of passive tobacco smoke exposure.  widowed no children retired no etoh Occupation: Retired Designer, industrial/product for [employer] Alcohol Use - no Daily Caffeine Use  Illicit Drug Use - no Patient gets regular exercise.  Review of Systems       The patient complains of allergy/sinus.  The patient denies anemia, anxiety-new, arthritis/joint pain, back pain, blood in urine, breast changes/lumps, change in vision, confusion, cough, coughing up blood, depression-new, fainting, fatigue, fever, headaches-new, hearing problems, heart murmur, heart rhythm changes, itching, menstrual pain, muscle pains/cramps, night sweats, nosebleeds, pregnancy symptoms, shortness of breath, skin rash, sleeping problems, sore throat, swelling of feet/legs, swollen lymph glands, thirst - excessive , urination - excessive , urination changes/pain, urine  leakage, vision changes, and voice change.    Vital  Signs:  Patient profile:   65 year old female Height:      62 inches Weight:      110.13 pounds BMI:     20.22 Pulse rate:   100 / minute Pulse rhythm:   regular BP sitting:   94 / 58  (left arm) Cuff size:   regular  Vitals Entered By: June McMurray CMA Duncan Dull) (July 26, 2010 3:15 PM)  Physical Exam  General:  Well developed, well nourished, no acute distress. Head:  Normocephalic and atraumatic. Eyes:  PERRLA, no icterus.exam deferred to patient's ophthalmologist.   Abdomen:  Soft, nontender and nondistended. No masses, hepatosplenomegaly or hernias noted. Normal bowel sounds.Veryt high pitched loud bowel sounds noted. Neurologic:  Alert and  oriented x4;  grossly normal neurologically. Psych:  Alert and cooperative. Normal mood and affect.   Impression & Recommendations:  Problem # 1:  ANEMIA, IRON DEFICIENCY (ICD-280.9) Assessment Improved anemia profile ordered. Hopefully we can stop her iron replacement since this seems to exacerbate her GI complaints. Orders: TLB-B12, Serum-Total ONLY (04540-J81) TLB-Ferritin (82728-FER) TLB-Folic Acid (Folate) (82746-FOL) TLB-IBC Pnl (Iron/FE;Transferrin) (83550-IBC)  Problem # 2:  DIARRHEA (ICD-787.91) Assessment: Unchanged Possible bacterial overgrowth syndrome versus diarrhea predominant IBS. I have decided to treat her with Xifaxan 550 mg twice a day for [redacted] weeks along with Align probiotic therapy. If she continues with diarrhea will consider latornex administration which was not completed previously as suggested over a year ago. Her pharmacist at that time told her that this drug was" dangerous" allegedly. She is to call in 2 weeks time for progress report.  Problem # 3:  ABDOMINAL PAIN, EPIGASTRIC (ICD-789.06) Assessment: Improved continue AcipHex 20 mg a day.  Patient Instructions: 1)  Copy sent to : Shary Decamp, MD 2)  Please go to the basement today for your labs.  3)  Call back 2 weeks after finishing the Xifaxan  treatment.  4)  The medication list was reviewed and reconciled.  All changed / newly prescribed medications were explained.  A complete medication list was provided to the patient / caregiver. Prescriptions: XIFAXAN 550 MG TABS (RIFAXIMIN) take one by mouth two times a day for 10 days  #20 x 0   Entered by:   Harlow Mares CMA (AAMA)   Authorized by:   Mardella Layman MD [facility]   Signed by:   Harlow Mares CMA (AAMA) on 07/26/2010   Method used:   [facility] to        [facility]  [phone]* (retail)       [address] [place], [place]  [address]       Ph: [phone] or [phone]       Fax: [phone]   RxID:   [id number]

## 2010-08-18 ENCOUNTER — Ambulatory Visit (INDEPENDENT_AMBULATORY_CARE_PROVIDER_SITE_OTHER): Payer: 59

## 2010-08-18 DIAGNOSIS — J301 Allergic rhinitis due to pollen: Secondary | ICD-10-CM

## 2010-08-25 ENCOUNTER — Ambulatory Visit (INDEPENDENT_AMBULATORY_CARE_PROVIDER_SITE_OTHER): Payer: 59

## 2010-08-25 ENCOUNTER — Encounter: Payer: Self-pay | Admitting: Gastroenterology

## 2010-08-25 ENCOUNTER — Ambulatory Visit (INDEPENDENT_AMBULATORY_CARE_PROVIDER_SITE_OTHER): Payer: 59 | Admitting: Gastroenterology

## 2010-08-25 VITALS — BP 112/72 | HR 78 | Ht 62.0 in | Wt 106.6 lb

## 2010-08-25 DIAGNOSIS — E739 Lactose intolerance, unspecified: Secondary | ICD-10-CM

## 2010-08-25 DIAGNOSIS — Z86010 Personal history of colonic polyps: Secondary | ICD-10-CM

## 2010-08-25 DIAGNOSIS — J301 Allergic rhinitis due to pollen: Secondary | ICD-10-CM

## 2010-08-25 DIAGNOSIS — J45909 Unspecified asthma, uncomplicated: Secondary | ICD-10-CM

## 2010-08-25 DIAGNOSIS — R197 Diarrhea, unspecified: Secondary | ICD-10-CM

## 2010-08-25 DIAGNOSIS — D509 Iron deficiency anemia, unspecified: Secondary | ICD-10-CM

## 2010-08-25 DIAGNOSIS — K219 Gastro-esophageal reflux disease without esophagitis: Secondary | ICD-10-CM

## 2010-08-25 DIAGNOSIS — K589 Irritable bowel syndrome without diarrhea: Secondary | ICD-10-CM

## 2010-08-25 MED ORDER — ALOSETRON HCL 0.5 MG PO TABS: 0.5000 mg | ORAL_TABLET | Freq: Every day | ORAL | Status: DC

## 2010-08-25 NOTE — Assessment & Plan Note (Signed)
Followup with Dr. Jetty Duhamel as scheduled

## 2010-08-25 NOTE — Progress Notes (Signed)
This is a 65 year old white female with chronic diarrhea predominant IBS who continues with gas, bloating, occasional cramping, and frequent diarrhea. Recent treatment for possible bacterial overgrowth syndrome did not help her symptomatology. I reviewed her office notes, labs, and procedures in detail. She's had an extensive malabsorption workup which is negative and also small bowel biopsies and colonic biopsies. Stool exam for fecal elastase-1 also was normal. She had been on oral iron replacement therapy but serum B12 and folate levels were normal.  Pertinent Review of Systems Negative.. her asthma seems under good control, and she is followed closely by Dr. Maple Hudson.   Physical Exam: Not performed.    Assessment and Plan: Diarrhea predominant IBS. We will start her on Lotronex 0.5 mg once a day with progress report in one week's time. We will adjust her dosage as per her clinical response. Otherwise she is to see me in one month's time for followup. Continued avoidance of lactose, sorbitol and fructose advised.

## 2010-08-25 NOTE — Patient Instructions (Addendum)
Today you signed the Patient Acknowledge Form for Lotronex. Your prescription(s) have been sent to you pharmacy.  Call back in 1-2 weeks with an update on how well the medication is working.  Make office visit for 1 month.  Stop Iron.

## 2010-08-25 NOTE — Assessment & Plan Note (Signed)
Followup as per clinical protocol. 

## 2010-08-25 NOTE — Assessment & Plan Note (Signed)
Her chronic iron deficiency is improved with oral iron replacement therapy. B12, folate, and other labs for anemia workup had been negative. We have been unable to document malabsorption, and treatment for bacterial overgrowth syndrome has not helped her symptomatology.

## 2010-08-25 NOTE — Assessment & Plan Note (Signed)
She is asymptomatic on daily PPI therapy.

## 2010-08-25 NOTE — Assessment & Plan Note (Signed)
This continues to be her primary problem. Please see history and physical exam.

## 2010-08-26 ENCOUNTER — Telehealth: Payer: Self-pay | Admitting: Gastroenterology

## 2010-08-26 MED ORDER — ALOSETRON HCL 0.5 MG PO TABS: 0.5000 mg | ORAL_TABLET | Freq: Every day | ORAL | Status: DC

## 2010-08-26 NOTE — Telephone Encounter (Signed)
rx printed and signed for patient to pick up with a sticker. Left message for patient to pick up rx.

## 2010-08-31 ENCOUNTER — Ambulatory Visit (INDEPENDENT_AMBULATORY_CARE_PROVIDER_SITE_OTHER): Payer: 59 | Admitting: Internal Medicine

## 2010-08-31 ENCOUNTER — Encounter: Payer: Self-pay | Admitting: Internal Medicine

## 2010-08-31 ENCOUNTER — Telehealth: Payer: Self-pay | Admitting: Internal Medicine

## 2010-08-31 VITALS — BP 130/80 | HR 74 | Ht 62.0 in | Wt 109.2 lb

## 2010-08-31 DIAGNOSIS — J449 Chronic obstructive pulmonary disease, unspecified: Secondary | ICD-10-CM

## 2010-08-31 DIAGNOSIS — J309 Allergic rhinitis, unspecified: Secondary | ICD-10-CM

## 2010-08-31 MED ORDER — DOXYCYCLINE HYCLATE 100 MG PO CAPS: ORAL_CAPSULE | ORAL | Status: DC

## 2010-08-31 MED ORDER — PHENYLEPHRINE HCL 1 % NA SOLN
3.0000 [drp] | Freq: Once | NASAL | Status: AC
  Administered 2010-08-31: 3 [drp] via NASAL

## 2010-08-31 MED ORDER — METHYLPREDNISOLONE ACETATE 80 MG/ML IJ SUSP
80.0000 mg | Freq: Once | INTRAMUSCULAR | Status: AC
  Administered 2010-08-31: 80 mg via INTRAMUSCULAR

## 2010-08-31 NOTE — Telephone Encounter (Signed)
Per CDY-appt today at 2pm. Pt aware of time and date.[name]

## 2010-08-31 NOTE — Patient Instructions (Signed)
Neb neo   Depo 80  Script to hold for doxycycline antibiotic  Suggest a dust/ pollen mask and goggles or sunglasses when doing dusty work like putting out [employer]

## 2010-08-31 NOTE — Telephone Encounter (Signed)
Called and spoke w/ pt and she thinks she has a sinus infection. Pt c/o sore throat from all the nasal drainage, watery eyes, sinus pressure, and cough all started yesterday. Pt req to have a [facility]pak [facility]. Please advise Dr. Maple Hudson. Thanks  Allergies  Allergen Reactions  . Codeine     Carver Fila, CMA

## 2010-08-31 NOTE — Progress Notes (Signed)
  Subjective:    Patient ID: [name], female    DOB: 20-Jun-1945, 64 y.o.   MRN: [id number]  HPI 49 yoF never smoker followed for allergic rhinitis, asthma and COPD after long hx of second hand smoke exposure before husband died.  After putting out pine needles over the weekend she comes for acute visit today complaining of allergy flare. . Watery eyes dry sore throat, dry hacking cough, dripping nose w/o sneeze or itch or rash. No sick exposure. Did find ticks attached. No unusual wheeze, phlegm, rash or nodes.   Review of Systems See HPI Constitutional:   No weight loss, night sweats,  Fevers, chills, fatigue, lassitude. HEENT:   No headaches,  Difficulty swallowing,  Tooth/dental problems,           CV:  No chest pain,  Orthopnea, PND, swelling in lower extremities, anasarca, dizziness, palpitations  GI  No heartburn, indigestion, abdominal pain, nausea, vomiting, diarrhea, change in bowel habits, loss of appetite  Resp: No shortness of breath with exertion or at rest.  No excess mucus, no productive cough,  No non-productive cough,  No coughing up of blood.  No change in color of mucus.  No wheezing.  Skin: no rash or lesions.  GU: no dysuria, change in color of urine, no urgency or frequency.  No flank pain.  MS:  No joint pain or swelling.  No decreased range of motion.  No back pain.  Psych:  No change in mood or affect. No depression or anxiety.  No memory loss.      Objective:   Physical Exam General- Alert, Oriented, Affect-appropriate, Distress- none acute  Skin- rash-none, lesions- none, excoriation- none  Lymphadenopathy- none  Head- atraumatic  Eyes- Gross vision intact, PERRLA, conjunctivae clear secretions  Ears- Hearing-normal canals, Tm L , R ,  Nose- Clear, sounds a little stuffy,  No-Septal dev, mucus, polyps, erosion, perforation   Throat- Mallampati II , mucosa clear , drainage- none, tonsils- atrophic............mild hoarsenes  Neck- flexible  , trachea midline, no stridor , thyroid nl, carotid no bruit  Chest - symmetrical excursion , unlabored     Heart/CV- RRR , no murmur , no gallop  , no rub, nl s1 s2                     - JVD- none , edema- none, stasis changes- none, varices- none     Lung- diminished, wheeze- none, cough- none , dullness-none, rub- none     Chest wall-Abd- tender-no, distended-no, bowel sounds-present, HSM- no  Br/ Gen/ Rectal- Not done, not indicated  Extrem- cyanosis- none, clubbing, none, atrophy- none, strength- nl  Neuro- grossly intact to observation        Assessment & Plan:

## 2010-08-31 NOTE — Assessment & Plan Note (Signed)
Acute rhinitis after a specific exposure. She asks depo to head it off before her chest gets worse. Doubt infection now, but will let her hold an antibiotic that would have incidental effect against the tick-borne agents, though it seems early in the season for that.

## 2010-09-06 ENCOUNTER — Encounter: Payer: Self-pay | Admitting: Internal Medicine

## 2010-09-06 NOTE — Assessment & Plan Note (Signed)
Fixed COPD but with a variable reactive component showing up again as an acute exacerbation after probable viral syndrome.

## 2010-09-20 ENCOUNTER — Ambulatory Visit (INDEPENDENT_AMBULATORY_CARE_PROVIDER_SITE_OTHER): Payer: 59

## 2010-09-20 DIAGNOSIS — J309 Allergic rhinitis, unspecified: Secondary | ICD-10-CM

## 2010-09-29 ENCOUNTER — Ambulatory Visit (INDEPENDENT_AMBULATORY_CARE_PROVIDER_SITE_OTHER): Payer: 59

## 2010-09-29 ENCOUNTER — Ambulatory Visit: Payer: 59 | Admitting: Gastroenterology

## 2010-09-29 DIAGNOSIS — J309 Allergic rhinitis, unspecified: Secondary | ICD-10-CM

## 2010-10-11 NOTE — Op Note (Signed)
NAMEEMILIE, CARP              ACCOUNT NO.:  [id number]   MEDICAL RECORD NO.:  [id number]          PATIENT TYPE:  AMB   LOCATION:  [facility]                          FACILITY:  [facility]   PHYSICIAN:  Harvie Junior, M.D.   DATE OF BIRTH:  03-30-1946   DATE OF PROCEDURE:  09/02/2008  DATE OF DISCHARGE:                               OPERATIVE REPORT   PREOPERATIVE DIAGNOSIS:  Volar ganglion cyst, left.   POSTOPERATIVE DIAGNOSIS:  Volar ganglion cyst, left.   PRINCIPAL PROCEDURE:  Excision of volar ganglion cyst, left wrist.   SURGEON:  Harvie Junior, M.D.   ASSISTANT:  Marshia Ly, P.A.   ANESTHESIA:  General.   BRIEF HISTORY:  Ms. Graeff is a 65 year old female with long history of  having left wrist pain.  We treated conservatively for a period of time.  MRI was obtained, which showed that she had a volar ganglion cyst.  She  was taken to the operating room for excision after failure of all  conservative care.   PROCEDURE:  The patient was taken to the operating room.  After adequate  anesthesia obtained with general anesthetic, the patient was placed  supine on the operating table.  The left wrist was prepped and draped in  usual sterile fashion.  Following this, the arm was exsanguinated, the  blood pressure tourniquet was inflated to 250 mmHg.  A curved incision  was made over the volar aspect of wrist.  Subcutaneous tissue was taken  down to the level of volar aspect of the wrist.  The radial artery was  identified and protected and we went over by the brachioradialis over  the radial styloid.  The cyst was identified and tracked down to the  radiocarpal joint.  The MR showed that it went to the  radioscaphocapitate ligament and I really went right down into this  area, it opened the joint in this area, fluid was obtained here.  We saw  the cyst sac, which was excised.  Once this was completed, we bovied the  area of the entrance of the cyst into the wrist capsule and at  this  point I let the tourniquet down.  There was no significant bleeding.  The wound was then closed with interrupted nylon stitches.  Sterile  compressing dressing was applied.  The patient was taken to recovery  room and was noted to be in satisfactory condition.   ESTIMATED BLOOD LOSS:  None.      Harvie Junior, M.D.  Electronically Signed     JLG/MEDQ  D:  09/02/2008  T:  09/03/2008  Job:  [id number]

## 2010-10-11 NOTE — Assessment & Plan Note (Signed)
 HEALTHCARE                             PULMONARY OFFICE NOTE   Holly, Ball                     MRN:          [id number]  DATE:01/18/2007                            DOB:          07-19-1945    PROBLEM:  1. Asthma.  2. Allergic rhinitis.  3. Eustachian dysfunction.  4. Chronic obstructive pulmonary disease.  5. Esophageal reflux.   HISTORY:  Eyes water, sniffing, maybe a little wheeze.  She asks if her  vaccine is strong enough.  By description, it sounds as if this is  mostly related to being outside in recent poor air quality conditions.  She continues her allergy vaccine at 1:10, and we have reviewed her  testing from 2006.   MEDICATIONS:  1. Fosamax 70 mg.  2. Advair 100/50 b.i.d.  3. Allergy vaccine.  4. Menostar patch.  5. Calcium.  6. Nexium.  7. Tylenol.   ALLERGIES:  DRUG INTOLERANCE TO CODEINE.   Pulmonary functions last January had shown mild obstructive disease,  mainly in small airways, with some response to bronchodilator; normal  lung volumes, normal diffusion.  I suspect this was a second-hand smoke  issue.  A chest x-ray in March of 2007 showed some hyperinflation  consistent with a bit of air trapping, no acute findings.   OBJECTIVE:  VITAL SIGNS:  Weight 107 pounds.  Blood pressure 138/88,  pulse 78, room air saturation 98%.  HEENT:  There is some white mucous bridging in the nose.  Turbinates  were not edematous.  No periorbital edema or conjunctival injection.  Pharynx clear.  Voice quality normal.  LUNGS:  Clear.  HEART:  Heart sounds regular and normal.   IMPRESSION:  1. Mild asthma/chronic obstructive pulmonary disease.  2. Allergic rhinitis.  3. Mild airway irritation currently.  May reflect seasonal allergy but      also is related to nonspecific airway irritation from poor air      quality.   PLAN:  1. Environmental precautions.  Sample of Veramyst, 1-2 sprays to each      nostril daily for  trial.  2. Try increasing Advair from her 100/50 to a sample of 250/50 for 2      weeks and then drop back to her regular strength.  3. Fluids.  4. Schedule return in 6 months, earlier p.r.n.     Clinton D. Maple Hudson, MD, [facility], FACP  Electronically Signed    CDY/MedQ  DD: 01/18/2007  DT: 01/20/2007  Job #: [id number]   cc:   Thora Lance, M.D.

## 2010-10-11 NOTE — Assessment & Plan Note (Signed)
 HEALTHCARE                             PULMONARY OFFICE NOTE   [name], [name]                     MRN:          [id number]  DATE:11/12/2006                            DOB:          [birth year]    HISTORY OF PRESENT ILLNESS:  The patient is a 65 year old white female  patient of Dr. Maple Hudson who has a known history of asthma and allergic  rhinitis who presents today complaining of an episode that occurred 1  week ago when she had several episodes of pain along the mid to right  chest wall when she took in a deep breath.  The symptoms were shortly  after lunch and felt as if she had a catch in her side when she took in  a deep breath.  Symptoms were resolved within a few hours and has had no  recurrence.  The patient does complain that she has daily reflux.  The  patient has previously been on Nexium; however has been off of this for  several months.  The patient denies any exertional chest pain,  diaphoresis, shortness of breath, wheezing, fever, purulent sputum,  orthopnea, PND or leg swelling.   PAST MEDICAL HISTORY:  Is reviewed.   PHYSICAL EXAMINATION:  The patient is a pleasant female in no acute  distress.  She is afebrile with stable vital signs.  O2 saturation is 99% on room  air.  HEENT:  Unremarkable.  NECK:  Supple without adenopathy.  No JVD.  LUNGS:  Sounds are clear to auscultation bilaterally without any  wheezing or crackles.  CARDIAC:  Regular rate.  ABDOMEN:  Soft, nontender.  EXTREMITIES:  Warm without any calf tenderness, cyanosis, clubbing or  edema.   IMPRESSION AND PLAN:  Episode of chest wall pain with questionable  etiology. The patient has had no reoccurrences and was not associated w/  exertion.  Possibly related to uncontrolled reflux symptoms.  Patient is  advised to hold Fosamax over the next 2 weeks.  Restart Nexium 40 mg  daily along with reflux preventative measures.  Patient will recheck  here in 1 month  with Dr. Maple Hudson.  Patient is advised if symptoms do not  improve or worsen, she is to contact our office for sooner followup or  seek emergency room services.      Rubye Oaks, NP  Electronically Signed      Clinton D. Maple Hudson, MD, [facility]oll[facility], FACP  Electronically Signed   TP/MedQ  DD: 11/12/2006  DT: 11/12/2006  Job #: [id number]

## 2010-10-11 NOTE — Assessment & Plan Note (Signed)
[facility]                             PULMONARY OFFICE NOTE   CRYSTA, GULICK                     MRN:          [id number]  DATE:12/19/2006                            DOB:          02/17/1946    PROBLEM:  1. Asthma.  2. Allergic rhinitis.  3. Eustachian dysfunction.  4. Chronic obstructive pulmonary disease.  5. Esophageal reflux.   HISTORY:  She had been seen in mid June by the nurse practitioner  because of nonspecific chest pain, treated for reflux.  Today she  questions whether those complaints might be allergy.  She groups a  complaint that her left eye has a little bit of discharge and itches at  times, along with what she describes as a catchy sensation in her  chest with a deep breath and sometimes a little discomfort in midsternum  if she forces a deep breath.  She has not had wheezing or sneezing.  Her  last allergy testing was 2 years ago and she has been stable on vaccine  at 1:10.  I pointed out that this is not a peak pollen season and we  discussed her environment and explored some alternative explanations,  particularly for her chest discomfort.  She has been using Zaditor eye  drops and complains that there is a film over her left eye that comes  and goes.  I suggested she see her eye doctor.  She asks refill of a  rescue inhaler, which we discussed.   MEDICATIONS:  1. Fosamax 70 mg.  2. Advair 100/50 b.i.d.  3. Allergy vaccine.  4. Menostar patch.  5. Nexium.   ALLERGIES:  Drug intolerant to CODEINE.   OBJECTIVE:  Weight 106 pounds, which is up a little.  BP 122/94, pulse  77. Room air saturation 100%.  I do not see anything wrong with her eyes or conjunctivae.  Nasal mucosa  is clear.  LUNGS:  Clear.  She is slender.  HEART:  Sounds are normal.  I do not find adenopathy.   IMPRESSION:  1. Mild asthma/chronic obstructive pulmonary disease previously      documented with an atopic component.  2. By  complaint, may include a component of allergic conjunctivitis,      but for any distortion of vision in particular, I want her to see      her eye doctor.  3. Chest complaints are a little nonspecific and may be      multifactorial.  I cannot exclude components of anxiety, esophageal      reflux and chest wall pain.   PLAN:  1. She is given a sample of Optivar 1 or 2 drops each eye b.i.d.      p.r.n.  2. Prescription for ProAir HFA inhaler.  3. We reviewed environmental precautions.  4. We discussed symptoms that could relate to either cardiac or      esophageal disease, so that she can be more discriminating.  5. If she is stable and comfortable she will return in the fall.  For      nonrespiratory complaints she is encouraged to  return early to Dr.      Valentina Lucks.     Clinton D. Maple Hudson, MD, [facility] Bollman, FACP  Electronically Signed    CDY/MedQ  DD: 12/19/2006  DT: 12/20/2006  Job #: [id number]   cc:   Thora Lance, M.D.

## 2010-10-14 NOTE — Assessment & Plan Note (Signed)
[facility]                               PULMONARY OFFICE NOTE   Holly, [name]                     MRN:          [id number]  DATE:03/26/2006                            DOB:          [birth year]    HISTORY OF PRESENT ILLNESS:  The patient is a 65 year old white female  patient of Dr. Roxy Cedar who has a known history of asthma and allergic  rhinitis who presents with a two week history of nasal congestion, post-  nasal drip, hoarseness and productive cough with thick yellowing mucus and  low-grade fever. The patient denies any chest pain, orthopnea, PND or leg  swelling. No recent travel or antibiotic use.   CURRENT MEDICATIONS:  Are correct and reviewed.   PHYSICAL EXAMINATION:  The patient is a pleasant female in no acute  distress. Temperature: 99.3. Blood pressure: 110/64. O2 saturation is 98% on  room air.  HEENT: Nasal mucosa with some mild erythema. Nontender sinuses. Oropharynx  is clear.  NECK: Supple without adenopathy. No JVD.  LUNGS: Lung sounds are clear.  CARDIAC: Regular rate and rhythm.  ABDOMEN: Soft and benign.  EXTREMITIES: Warm without any edema.   IMPRESSION/PLAN:  Acute upper respiratory infection. The patient is again on  his __________. Mucinex DM. May use Endal HD #8 ounces one teaspoon every 4-  6 hours as needed for cough. The patient is to return if no improvement or  if symptoms worsen.     ______________________________  Rubye Oaks, NP    ______________________________  Rennis Chris. Maple Hudson, MD, [facility], FACP   TP/MedQ  DD: 03/26/2006  DT: 03/26/2006  Job #: [id number]

## 2010-10-14 NOTE — Consult Note (Signed)
[name], [name]                        ACCOUNT NO.:  [id number]   MEDICAL RECORD NO.:  [id number]                   PATIENT TYPE:  EMS   LOCATION:  ED                                   FACILITY:  [facility]   PHYSICIAN:  Zola Button T. Lazarus Salines, M.D.              DATE OF BIRTH:  1946/04/06   DATE OF CONSULTATION:  12/18/2002  DATE OF DISCHARGE:  12/18/2002                                   CONSULTATION   REPORT TITLE:  OTOLARYNGOLOGY CONSULTATION   REFERRING PHYSICIAN:  Lorre Nick, M.D.   CHIEF COMPLAINT:  Esophageal foreign body.   HISTORY OF PRESENT ILLNESS:  This is a 65 year old white female who was  eating a cheese pizza around 7 p.m. this past evening.  She felt something  that felt long and sharp lodge in her throat.  It was painful every time she  swallowed.  She brought up a small quantity of blood.  No cough or breathing  difficulty.  No changes in voice.  She tried bread and similar and was  unable to dislodge it.  She came to the emergency room for further  consideration.  She does have reflux for which she takes twice daily  Protonix.  Her reflux has been well controlled and she has never had a  foreign body lodged, actual dysphagia, or a foreign body sensation in her  throat.  Prior to this point she has taken a full regular diet without any  difficulties whatsoever.   In the emergency room, she had a barium swallow with a possible radioopaque  area considered for possible foreign body identified.  She had several  swallows of Sprite and also a 1 mg Glucagon IV injection somewhere in the 20  minutes it took me to traverse from my home to the emergency room.  She  claims it felt like it passed further down and at the time of my first  meeting with her, she no longer feels that the lesion is actively poking her  in the throat.  No breathing or voice problems at any point during this.  She does have some asthma, but nothing currently active.   PHYSICAL EXAMINATION:   GENERAL:  This is a petite, thin middle-aged white  female in no distress.  Mental status is appropriate.  She hears well in  conversational speech.  Voice is clear.  Respirations unlabored.  She does  not appear uncomfortable.  She is not coughing or clearing her throat at  all.  She is not drooling.  HEENT:  Atraumatic.  Ear canals are clear with aerated drums of normal  configuration.  Anterior nose reveals healthy membranes and slightly  corrugated septum.  Oral cavity reveals teeth in good repair and moist  membranes.  She has a rather strong gag reflex.  There is a small streak of  reddish material just behind the left posterior tonsillar pillar, possibly  blood, but  no actual excoriation noted and no foreign body noted.  I could  not see the hypopharynx with the mirror secondary to gag.  NECK:  Supple.  Without adenopathy.  Laryngeal crepitus is normal and  nontender.  This does not provoke any symptoms including cough.  No  adenopathy or thyromegaly.  NEUROLOGICAL:  Cranial nerves intact.   PROCEDURE:  Following topical 1% viscous Xylocaine to both nostrils, the  flexible laryngoscope was introduced through the right side.  The  nasopharynx was clear with normal eustachian pori and no bleeding.  Oropharynx is clear.  Hypopharynx reveals very slight swelling and an area  of superficial blood on the back side of the left arytenoid.  The endolarynx  was completely normal with mobile vocal cords and a good airway.  There is  perhaps slight postcricoid edema.  No pooling in either piriform.  The  valleculae are clear.   IMPRESSION:  Hypopharyngeal/laryngeal excoriation.  History consistent with  an esophageal foreign body which now sounds like it may have passed.   PLAN:  The flexible scope was used as above.  I would like her to remain  n.p.o. and given the fact that she has recently ingested Sprite and the fact  that it feels like it has dislodged, I would not be able to put her  to sleep  and would choose not to put her to sleep immediately.  She will call me in  six hours prior to the start of my daily schedule to let me know how her  throat feels with the special emphasis on whether she still senses active  poking when she swallows.  If she is having active poking, then we will plan  to put her to sleep sometime tomorrow and do esophagoscopy.  If it is  slightly raw, but no poking, then we will observe for additional time and  she will recontact [place].  There may be some insurance issues which we will  have to sort out in the morning regarding whether I can continue in her  followup care beyond the emergency setting with [employer].  The  patient understands and agrees with both the medical and the financial  issues.                                               [name]. Lazarus Salines, M.D.    KTW/MEDQ  D:  12/18/2002  T:  12/18/2002  Job:  [id number]   cc:   Lorre Nick, M.D.  [address] [place], [place] [address]  Fax: [phone]

## 2010-10-14 NOTE — Assessment & Plan Note (Signed)
[facility]                               PULMONARY OFFICE NOTE   Holly, Ball                     MRN:          [id number]  DATE:02/05/2006                            DOB:          1945/10/11    PROBLEM LIST:  1. Asthma.  2. Allergic rhinitis.  3. Eustachian dysfunction.  4. Chronic obstructive pulmonary disease.  5. Esophageal reflux.   HISTORY:  [name] has felt well this summer but comes asking if she could  have Alpha I antitrypsin testing.  She has a long history of second hand  smoke exposure with little cough, chest tightness or discomfort recently.  Pulmonary function tests at [facility] in the past had  demonstrated moderate obstruction with an FEV1 around 62% of predicted.  She  has no body in her immediate family with significant pulmonary history,  especially any early dependence on oxygen.  She has no personal history of  liver disease.   MEDICATIONS:  1. Fosamax.  2. Advair 100/50.  3. Allergy vaccine which she gets here at 1 to 10.  4. Nexium 40 mg.  5. Allegra.  6. Menostar patch.  7. Fish oil.  8. Pravachol 20 mg.  9. Occasional Rolaids.   ALLERGIES:  Drug intolerant to CODEINE.   OBJECTIVE:  GENERAL APPEARANCE:  A trim little lady in no evident distress.  VITAL SIGNS:  Weight 96 pounds, blood pressure 122/78, pulse regular 73,  room air saturation 100%.  HEENT:  Nose and throat are clear.  NECK:  No neck vein distention nor peripheral edema.  No adenopathy.  LUNGS:  Clear today with no wheeze, cough or rales.  CARDIOVASCULAR:  Heart sounds regular without murmur.   IMPRESSION:  1. Asthma with chronic obstructive pulmonary disease which has been      clinically stable on Advair this summer.  2. Mild allergic rhinitis controlled on vaccine.   PLAN:  1. Schedule PFT.  2. Alpha I antitrypsin level.  3. Schedule return in six months but earlier p.r.n.     Clinton D. Maple Hudson, MD, [facility], FACP   CDY/MedQ  DD:  02/05/2006  DT:  02/06/2006  Job #:  [id number]   cc:   [name], M.D.

## 2010-10-14 NOTE — Op Note (Signed)
[name], [name]                        ACCOUNT NO.:  [id number]   MEDICAL RECORD NO.:  [id number]                   PATIENT TYPE:  AMB   LOCATION:                                       FACILITY:  [facility]   PHYSICIAN:  Vania Rea. Jarold Motto, M.D. [facility]        DATE OF BIRTH:  01-22-1946   DATE OF PROCEDURE:  04/28/2003  DATE OF DISCHARGE:                                 OPERATIVE REPORT   INDICATIONS FOR PROCEDURE:  The patient is a 65 year old white female with  refractory acid reflux on three times a day PPI therapy.  Esophageal  manometry was performed to be sure that she does not have an esophageal  motility disorder, and also to assess for possible fundoplication surgery.  The results of the manometry are as follows.   Upper esophageal sphincter.  There appears to be good coordination between  pharyngeal contraction and cricopharyngeal relaxation.   Lower esophageal sphincter.  The lower esophageal sphincter pressure is  borderline at 17 mmHg with 65% relaxation of swallowing.   Motility pattern.  There generally is normal peristalsis throughout the  esophagus with mean amplitude of contractions approximately 70 mmHg.   ASSESSMENT:  This patient has a borderline incompetent lower esophageal  sphincter pressure and no evidence of esophageal motility disorder. I see no  contraindication to fundoplication surgery.                                               Vania Rea. Jarold Motto, M.D. [facility]    DRP/MEDQ  D:  05/04/2003  T:  05/05/2003  Job:  [id number]   cc:   [place] Daphine Deutscher, M.D.  [address]  [place]  [place] [address]  Fax: [phone]

## 2010-10-14 NOTE — Assessment & Plan Note (Signed)
[facility]                             PULMONARY OFFICE NOTE   Holly, Ball                     MRN:          [id number]  DATE:07/20/2006                            DOB:          Sep 22, 1945    PROBLEMS:  1. Asthma.  2. Allergic rhinitis.  3. Eustachian dysfunction.  4. Chronic obstructive pulmonary disease.  5. Esophageal reflux.   HISTORY:  She says she is doing pretty well, occasional mild coughing  spells she attributes to allergy because she thinks there is a little  postnasal drainage.  This is a good time of year for her.  She has had  pneumococcal vaccine at least 3 times and has flu vaccine this fall.   MEDICATIONS:  1. Fosamax 70 mg per week.  2. Advair 100/50.  3. Allergy vaccine, which is given here at 1:10 without problems.  4. Nexium 40 mg.  5. Menostar patch.  6. Fish oil.  7. Calcium.  8. Tylenol.  9. P.r.n. Allegra.   ALLERGIES:  DRUG INTOLERANT OF CODEINE.   OBJECTIVE:  Weight 102 pounds, BP 122/58, pulse regular 88, room air  saturation 100%.  Eyes, nose and throat are clear today.  CHEST:  Clear.  HEART:  Sounds are normal.   PFT:  Our study from January 29 showed mild obstructive disease and  small airways where there was some response to bronchodilator,  incomplete.  Measured lung volumes and diffusion were normal supporting  an impression of asthma with COPD.  She had had a long secondhand smoke  history from her husband.  Her alpha-1 antitrypsin level was normal.   IMPRESSION:  1. Asthma with a mild chronic obstructive pulmonary disease component.  2. Allergic rhinitis, currently stable.   PLAN:  1. Continue allergy vaccine.  2. Schedule return in 6 months, earlier p.r.n.     Clinton D. Maple Hudson, MD, [facility], FACP  Electronically Signed    CDY/MedQ  DD: 07/20/2006  DT: 07/20/2006  Job #: [id number]   cc:   Thora Lance, M.D.

## 2010-10-26 ENCOUNTER — Ambulatory Visit (INDEPENDENT_AMBULATORY_CARE_PROVIDER_SITE_OTHER): Payer: 59

## 2010-10-26 DIAGNOSIS — J309 Allergic rhinitis, unspecified: Secondary | ICD-10-CM

## 2010-10-27 ENCOUNTER — Ambulatory Visit: Payer: 59 | Admitting: Gastroenterology

## 2010-11-09 ENCOUNTER — Ambulatory Visit: Payer: 59 | Admitting: Internal Medicine

## 2010-11-10 ENCOUNTER — Ambulatory Visit (INDEPENDENT_AMBULATORY_CARE_PROVIDER_SITE_OTHER): Payer: 59

## 2010-11-10 DIAGNOSIS — J309 Allergic rhinitis, unspecified: Secondary | ICD-10-CM

## 2010-11-17 ENCOUNTER — Ambulatory Visit (HOSPITAL_COMMUNITY): Payer: 59 | Attending: Family Medicine

## 2010-11-17 DIAGNOSIS — M810 Age-related osteoporosis without current pathological fracture: Secondary | ICD-10-CM | POA: Insufficient documentation

## 2010-11-18 ENCOUNTER — Ambulatory Visit: Payer: 59 | Admitting: Gastroenterology

## 2010-11-25 ENCOUNTER — Ambulatory Visit (INDEPENDENT_AMBULATORY_CARE_PROVIDER_SITE_OTHER): Payer: 59

## 2010-11-25 DIAGNOSIS — J309 Allergic rhinitis, unspecified: Secondary | ICD-10-CM

## 2010-12-08 ENCOUNTER — Ambulatory Visit (INDEPENDENT_AMBULATORY_CARE_PROVIDER_SITE_OTHER): Payer: 59

## 2010-12-08 DIAGNOSIS — J309 Allergic rhinitis, unspecified: Secondary | ICD-10-CM

## 2010-12-14 ENCOUNTER — Ambulatory Visit (INDEPENDENT_AMBULATORY_CARE_PROVIDER_SITE_OTHER): Payer: 59 | Admitting: Internal Medicine

## 2010-12-14 ENCOUNTER — Ambulatory Visit (INDEPENDENT_AMBULATORY_CARE_PROVIDER_SITE_OTHER): Payer: 59

## 2010-12-14 ENCOUNTER — Encounter: Payer: Self-pay | Admitting: Internal Medicine

## 2010-12-14 VITALS — BP 132/62 | HR 77 | Ht 62.0 in | Wt 114.2 lb

## 2010-12-14 DIAGNOSIS — J45909 Unspecified asthma, uncomplicated: Secondary | ICD-10-CM

## 2010-12-14 DIAGNOSIS — J449 Chronic obstructive pulmonary disease, unspecified: Secondary | ICD-10-CM

## 2010-12-14 DIAGNOSIS — J309 Allergic rhinitis, unspecified: Secondary | ICD-10-CM

## 2010-12-14 NOTE — Assessment & Plan Note (Signed)
Doing very well

## 2010-12-14 NOTE — Patient Instructions (Signed)
Please call as needed 

## 2010-12-14 NOTE — Progress Notes (Signed)
  Subjective:    Patient ID: [name], female    DOB: [birth year], 65 y.o.   MRN: [id number]  Asthma Her past medical history is significant for asthma.  25 yoF never smoker followed for allergic rhinitis, asthma and COPD after long hx of second hand smoke exposure before husband died.  After putting out pine needles over the weekend she comes for acute visit today complaining of allergy flare. . Watery eyes dry sore throat, dry hacking cough, dripping nose w/o sneeze or itch or rash. No sick exposure. Did find ticks attached. No unusual wheeze, phlegm, rash or nodes.  12/14/10- 64 yoF never smoker followed for allergic rhinitis, asthma and COPD after long hx of second hand smoke exposure before husband died. Last here 06-15-10-  Allergy vaccine still seems helpful. Wears dust mask if dusting or mowing., otw expect scratchy throat. Continues Yoga and exercise classes.  Review of Systems See HPI Constitutional:   No weight loss, night sweats,  Fevers, chills, fatigue, lassitude. HEENT:   No headaches,  Difficulty swallowing,  Tooth/dental problems,           CV:  No chest pain,  Orthopnea, PND, swelling in lower extremities, anasarca, dizziness, palpitations  GI  No heartburn, indigestion, abdominal pain, nausea, vomiting, diarrhea, change in bowel habits, loss of appetite  Resp: No shortness of breath with exertion or at rest.  No excess mucus, no productive cough,  No non-productive cough,  No coughing up of blood.  No change in color of mucus.  No wheezing.  Skin: no rash or lesions.  GU: no dysuria, change in color of urine, no urgency or frequency.  No flank pain.  MS:  No joint pain or swelling.  No decreased range of motion.  No back pain.  Psych:  No change in mood or affect. No depression or anxiety.  No memory loss.      Objective:   Physical Exam  General- Alert, Oriented, Affect-appropriate, Distress- none acute  Skin- rash-none, lesions- none,  excoriation- none  Lymphadenopathy- none  Head- atraumatic  Eyes- Gross vision intact, PERRLA, conjunctivae clear secretions  Ears- Hearing-normal canals, Tm-normal  Nose- Clear, sounds a little stuffy,  No-Septal dev, mucus, polyps, erosion, perforation   Throat- Mallampati II , mucosa clear , drainage- none, tonsils- atrophic  Neck- flexible , trachea midline, no stridor , thyroid nl, carotid no bruit  Chest - symmetrical excursion , unlabored     Heart/CV- RRR , no murmur , no gallop  , no rub, nl s1 s2                     - JVD- none , edema- none, stasis changes- none, varices- none     Lung- diminished, wheeze- none, cough- none , dullness-none, rub- none     Chest wall-Abd- tender-no, distended-no, bowel sounds-present, HSM- no  Br/ Gen/ Rectal- Not done, not indicated  Extrem- cyanosis- none, clubbing, none, atrophy- none, strength- nl  Neuro- grossly intact to observation        Assessment & Plan:

## 2010-12-14 NOTE — Assessment & Plan Note (Signed)
Well controlled 

## 2010-12-15 ENCOUNTER — Encounter: Payer: Self-pay | Admitting: Internal Medicine

## 2010-12-15 ENCOUNTER — Ambulatory Visit (INDEPENDENT_AMBULATORY_CARE_PROVIDER_SITE_OTHER): Payer: 59

## 2010-12-15 DIAGNOSIS — J309 Allergic rhinitis, unspecified: Secondary | ICD-10-CM

## 2010-12-22 ENCOUNTER — Ambulatory Visit (INDEPENDENT_AMBULATORY_CARE_PROVIDER_SITE_OTHER): Payer: 59

## 2010-12-22 DIAGNOSIS — J309 Allergic rhinitis, unspecified: Secondary | ICD-10-CM

## 2011-01-02 ENCOUNTER — Ambulatory Visit (INDEPENDENT_AMBULATORY_CARE_PROVIDER_SITE_OTHER): Payer: 59

## 2011-01-02 DIAGNOSIS — J309 Allergic rhinitis, unspecified: Secondary | ICD-10-CM

## 2011-01-18 ENCOUNTER — Ambulatory Visit (INDEPENDENT_AMBULATORY_CARE_PROVIDER_SITE_OTHER): Payer: Medicare Other

## 2011-01-18 DIAGNOSIS — J309 Allergic rhinitis, unspecified: Secondary | ICD-10-CM

## 2011-01-31 ENCOUNTER — Ambulatory Visit (INDEPENDENT_AMBULATORY_CARE_PROVIDER_SITE_OTHER): Payer: Medicare Other

## 2011-01-31 DIAGNOSIS — J309 Allergic rhinitis, unspecified: Secondary | ICD-10-CM

## 2011-02-09 ENCOUNTER — Ambulatory Visit (INDEPENDENT_AMBULATORY_CARE_PROVIDER_SITE_OTHER): Payer: Medicare Other

## 2011-02-09 DIAGNOSIS — J309 Allergic rhinitis, unspecified: Secondary | ICD-10-CM

## 2011-02-27 ENCOUNTER — Ambulatory Visit (INDEPENDENT_AMBULATORY_CARE_PROVIDER_SITE_OTHER): Payer: Medicare Other

## 2011-02-27 DIAGNOSIS — Z23 Encounter for immunization: Secondary | ICD-10-CM

## 2011-02-27 DIAGNOSIS — J309 Allergic rhinitis, unspecified: Secondary | ICD-10-CM

## 2011-02-28 LAB — COMPREHENSIVE METABOLIC PANEL
ALT: 24
AST: 31
Alkaline Phosphatase: 39
CO2: 27
Chloride: 106
GFR calc Af Amer: >60
GFR calc non Af Amer: >60
Glucose, Bld: 87
Sodium: 139
Total Bilirubin: 0.7

## 2011-02-28 LAB — URINALYSIS, ROUTINE W REFLEX MICROSCOPIC
Hgb urine dipstick: NEGATIVE
Nitrite: NEGATIVE
Specific Gravity, Urine: 1.013
Urobilinogen, UA: 0.2

## 2011-02-28 LAB — DIFFERENTIAL
Basophils Absolute: 0
Basophils Relative: 1
Eosinophils Absolute: 0.2
Eosinophils Relative: 4
Neutrophils Relative %: 37 — ABNORMAL LOW

## 2011-02-28 LAB — URINE MICROSCOPIC-ADD ON

## 2011-02-28 LAB — CBC
MCHC: 33.7
Platelets: 194
RDW: 14.5

## 2011-02-28 LAB — LIPASE, BLOOD: Lipase: 26

## 2011-03-07 ENCOUNTER — Ambulatory Visit (INDEPENDENT_AMBULATORY_CARE_PROVIDER_SITE_OTHER): Payer: Medicare Other

## 2011-03-07 DIAGNOSIS — J309 Allergic rhinitis, unspecified: Secondary | ICD-10-CM

## 2011-03-20 ENCOUNTER — Ambulatory Visit (INDEPENDENT_AMBULATORY_CARE_PROVIDER_SITE_OTHER): Payer: Medicare Other

## 2011-03-20 DIAGNOSIS — J309 Allergic rhinitis, unspecified: Secondary | ICD-10-CM

## 2011-04-11 ENCOUNTER — Ambulatory Visit (INDEPENDENT_AMBULATORY_CARE_PROVIDER_SITE_OTHER): Payer: Medicare Other

## 2011-04-11 DIAGNOSIS — J309 Allergic rhinitis, unspecified: Secondary | ICD-10-CM

## 2011-05-02 ENCOUNTER — Ambulatory Visit (INDEPENDENT_AMBULATORY_CARE_PROVIDER_SITE_OTHER): Payer: Medicare Other

## 2011-05-02 DIAGNOSIS — J309 Allergic rhinitis, unspecified: Secondary | ICD-10-CM

## 2011-05-12 ENCOUNTER — Ambulatory Visit (INDEPENDENT_AMBULATORY_CARE_PROVIDER_SITE_OTHER): Payer: Medicare Other

## 2011-05-12 DIAGNOSIS — J309 Allergic rhinitis, unspecified: Secondary | ICD-10-CM

## 2011-05-19 ENCOUNTER — Ambulatory Visit (INDEPENDENT_AMBULATORY_CARE_PROVIDER_SITE_OTHER): Payer: Medicare Other

## 2011-05-19 DIAGNOSIS — J309 Allergic rhinitis, unspecified: Secondary | ICD-10-CM

## 2011-06-02 ENCOUNTER — Ambulatory Visit (INDEPENDENT_AMBULATORY_CARE_PROVIDER_SITE_OTHER): Payer: Medicare Other

## 2011-06-02 DIAGNOSIS — J309 Allergic rhinitis, unspecified: Secondary | ICD-10-CM

## 2011-06-13 ENCOUNTER — Encounter: Payer: Self-pay | Admitting: Internal Medicine

## 2011-06-27 ENCOUNTER — Ambulatory Visit (INDEPENDENT_AMBULATORY_CARE_PROVIDER_SITE_OTHER): Payer: Medicare Other

## 2011-06-27 DIAGNOSIS — J309 Allergic rhinitis, unspecified: Secondary | ICD-10-CM

## 2011-07-12 ENCOUNTER — Other Ambulatory Visit: Payer: Self-pay | Admitting: Gynecology

## 2011-07-13 ENCOUNTER — Ambulatory Visit (INDEPENDENT_AMBULATORY_CARE_PROVIDER_SITE_OTHER): Payer: Medicare Other

## 2011-07-13 DIAGNOSIS — J309 Allergic rhinitis, unspecified: Secondary | ICD-10-CM

## 2011-07-27 ENCOUNTER — Encounter: Payer: Self-pay | Admitting: *Deleted

## 2011-07-27 ENCOUNTER — Ambulatory Visit (INDEPENDENT_AMBULATORY_CARE_PROVIDER_SITE_OTHER): Payer: Medicare Other

## 2011-07-27 DIAGNOSIS — J309 Allergic rhinitis, unspecified: Secondary | ICD-10-CM

## 2011-07-28 ENCOUNTER — Encounter: Payer: Self-pay | Admitting: Gastroenterology

## 2011-07-28 ENCOUNTER — Ambulatory Visit (INDEPENDENT_AMBULATORY_CARE_PROVIDER_SITE_OTHER): Payer: Medicare Other | Admitting: Gastroenterology

## 2011-07-28 ENCOUNTER — Other Ambulatory Visit (INDEPENDENT_AMBULATORY_CARE_PROVIDER_SITE_OTHER): Payer: Medicare Other

## 2011-07-28 ENCOUNTER — Ambulatory Visit: Payer: Medicare Other | Admitting: Gastroenterology

## 2011-07-28 DIAGNOSIS — R438 Other disturbances of smell and taste: Secondary | ICD-10-CM

## 2011-07-28 DIAGNOSIS — M810 Age-related osteoporosis without current pathological fracture: Secondary | ICD-10-CM

## 2011-07-28 DIAGNOSIS — R6889 Other general symptoms and signs: Secondary | ICD-10-CM

## 2011-07-28 DIAGNOSIS — K219 Gastro-esophageal reflux disease without esophagitis: Secondary | ICD-10-CM

## 2011-07-28 DIAGNOSIS — R439 Unspecified disturbances of smell and taste: Secondary | ICD-10-CM

## 2011-07-28 DIAGNOSIS — D509 Iron deficiency anemia, unspecified: Secondary | ICD-10-CM

## 2011-07-28 LAB — FERRITIN: Ferritin: 10 ng/mL (ref 10.0–291.0)

## 2011-07-28 LAB — SEDIMENTATION RATE: Sed Rate: 29 mm/hr — ABNORMAL HIGH (ref 0–22)

## 2011-07-28 LAB — IBC PANEL: Iron: 73 ug/dL (ref 42–145)

## 2011-07-28 LAB — MAGNESIUM: Magnesium: 2.2 mg/dL (ref 1.5–2.5)

## 2011-07-28 MED ORDER — RABEPRAZOLE SODIUM 20 MG PO TBEC: DELAYED_RELEASE_TABLET | ORAL | Status: DC

## 2011-07-28 NOTE — Patient Instructions (Addendum)
Please go to the basement today for your labs.  Your prescription(s) have been sent to you pharmacy.  Buy Centrum Silver OTC and take one a day.    Patient advised after rx was sent to local pharmacy that her rx should go to mail order [facility] and [facility] and resent it to [facility].

## 2011-07-28 NOTE — Progress Notes (Signed)
This is a 66 year old Caucasian female with chronic GERD doing well all AcipHex 20 mg a day. She denies dysphagia or typical reflux symptoms. Her main complaint is a metallic taste in her mouth refractory to treatment for Candida per her oral surgeon-dentist, and also negative ENT exam. She has not been on metronidazole on 1 and otherwise has no new medications. She does take calcium and vitamin D for osteoporosis. GI-wise the patient denies other complaints, is even well, and has regular bowel movements without melena or hematochezia. He denies Raynaud's phenomenon or symptoms of collagen vascular disease. The patient denies other neurological complaints, loss of smell, or any symptoms suggestive of a neuromuscular disorder.  Current Medications, Allergies, Past Medical History, Past Surgical History, Family History and Social History were reviewed in [employer] record.  Pertinent Review of Systems Negative   Physical Exam: Healthy patient no distress. Blood pressure 122/74 pulse 60 and regular. BMI is 19. Examination oropharynx is unremarkable. I cannot appreciate thyromegaly or lymphadenopathy. Her chest is clear, she is in a regular rhythm without murmurs gallops or rubs, and her abdomen shows no organomegaly, masses or tenderness. Bowel sounds are normal. Peripheral extremities unremarkable. Mental status is normal without gross neurological deficits.    Assessment and Plan: GERD under good control on PPI therapy. This metallic taste in her mouth is unusual symptom, and usually devise diagnosis. I have ordered TSH, and zinc level, magnesium level, anemia profile, and sedimentation rate. Is up-to-date on her endoscopy and colonoscopy. I have urged her to take other medications as listed and reviewed in her record. Encounter Diagnoses  Name Primary?  . Esophageal reflux   . Metallic taste   . Iron deficiency anemia, unspecified    . Osteoporosis   . General symptoms,  other

## 2011-07-31 LAB — CELIAC PANEL 10
Endomysial Screen: NEGATIVE
Gliadin IgA: 8.3 U/mL (ref <20)
Gliadin IgG: 6.6 U/mL (ref <20)
IgA: 145 mg/dL (ref 69–380)

## 2011-08-02 ENCOUNTER — Telehealth: Payer: Self-pay | Admitting: *Deleted

## 2011-08-02 MED ORDER — FERROUS FUM-IRON POLYSACCH 162-115.2 MG PO CAPS: 1.0000 | ORAL_CAPSULE | Freq: Every day | ORAL | Status: DC

## 2011-08-02 NOTE — Telephone Encounter (Signed)
Pt called returning Leisha's call about lab results. Informed her her iron saturation is low and Dr Jarold Motto would like for her to begin Tandem x 3 months. I will call her in 3 months to set up a f/u lab and or appt with Dr Jarold Motto. Ordered Tandem at [facility]; pt stated understanding.

## 2011-08-03 ENCOUNTER — Ambulatory Visit (INDEPENDENT_AMBULATORY_CARE_PROVIDER_SITE_OTHER): Payer: Medicare Other

## 2011-08-03 DIAGNOSIS — J309 Allergic rhinitis, unspecified: Secondary | ICD-10-CM

## 2011-08-08 ENCOUNTER — Other Ambulatory Visit: Payer: Medicare Other

## 2011-08-08 DIAGNOSIS — R438 Other disturbances of smell and taste: Secondary | ICD-10-CM

## 2011-08-08 DIAGNOSIS — K219 Gastro-esophageal reflux disease without esophagitis: Secondary | ICD-10-CM

## 2011-08-08 DIAGNOSIS — D509 Iron deficiency anemia, unspecified: Secondary | ICD-10-CM

## 2011-08-08 LAB — FECAL OCCULT BLOOD, IMMUNOCHEMICAL: Fecal Occult Bld: NEGATIVE

## 2011-08-11 ENCOUNTER — Ambulatory Visit: Payer: Medicare Other | Admitting: Gastroenterology

## 2011-08-15 ENCOUNTER — Ambulatory Visit (INDEPENDENT_AMBULATORY_CARE_PROVIDER_SITE_OTHER): Payer: Medicare Other

## 2011-08-15 DIAGNOSIS — J309 Allergic rhinitis, unspecified: Secondary | ICD-10-CM

## 2011-08-24 ENCOUNTER — Ambulatory Visit (INDEPENDENT_AMBULATORY_CARE_PROVIDER_SITE_OTHER): Payer: Medicare Other

## 2011-08-24 DIAGNOSIS — J309 Allergic rhinitis, unspecified: Secondary | ICD-10-CM

## 2011-09-07 ENCOUNTER — Ambulatory Visit (INDEPENDENT_AMBULATORY_CARE_PROVIDER_SITE_OTHER): Payer: Medicare Other

## 2011-09-07 DIAGNOSIS — J309 Allergic rhinitis, unspecified: Secondary | ICD-10-CM

## 2011-09-14 ENCOUNTER — Ambulatory Visit (INDEPENDENT_AMBULATORY_CARE_PROVIDER_SITE_OTHER): Payer: Medicare Other

## 2011-09-14 DIAGNOSIS — J309 Allergic rhinitis, unspecified: Secondary | ICD-10-CM

## 2011-09-20 ENCOUNTER — Other Ambulatory Visit: Payer: Self-pay | Admitting: Dermatology

## 2011-09-21 ENCOUNTER — Ambulatory Visit (INDEPENDENT_AMBULATORY_CARE_PROVIDER_SITE_OTHER): Payer: Medicare Other

## 2011-09-21 DIAGNOSIS — J309 Allergic rhinitis, unspecified: Secondary | ICD-10-CM

## 2011-10-03 ENCOUNTER — Ambulatory Visit (INDEPENDENT_AMBULATORY_CARE_PROVIDER_SITE_OTHER): Payer: Medicare Other

## 2011-10-03 DIAGNOSIS — J309 Allergic rhinitis, unspecified: Secondary | ICD-10-CM

## 2011-10-10 ENCOUNTER — Ambulatory Visit (INDEPENDENT_AMBULATORY_CARE_PROVIDER_SITE_OTHER): Payer: Medicare Other

## 2011-10-10 DIAGNOSIS — J309 Allergic rhinitis, unspecified: Secondary | ICD-10-CM

## 2011-10-20 ENCOUNTER — Ambulatory Visit (INDEPENDENT_AMBULATORY_CARE_PROVIDER_SITE_OTHER): Payer: Medicare Other

## 2011-10-20 DIAGNOSIS — J309 Allergic rhinitis, unspecified: Secondary | ICD-10-CM

## 2011-10-31 ENCOUNTER — Ambulatory Visit (INDEPENDENT_AMBULATORY_CARE_PROVIDER_SITE_OTHER): Payer: Medicare Other

## 2011-10-31 DIAGNOSIS — J309 Allergic rhinitis, unspecified: Secondary | ICD-10-CM

## 2011-11-07 ENCOUNTER — Ambulatory Visit (INDEPENDENT_AMBULATORY_CARE_PROVIDER_SITE_OTHER): Payer: Medicare Other

## 2011-11-07 ENCOUNTER — Encounter: Payer: Self-pay | Admitting: Internal Medicine

## 2011-11-07 DIAGNOSIS — J309 Allergic rhinitis, unspecified: Secondary | ICD-10-CM

## 2011-11-16 ENCOUNTER — Ambulatory Visit (INDEPENDENT_AMBULATORY_CARE_PROVIDER_SITE_OTHER): Payer: Medicare Other

## 2011-11-16 DIAGNOSIS — J309 Allergic rhinitis, unspecified: Secondary | ICD-10-CM

## 2011-11-21 ENCOUNTER — Telehealth: Payer: Self-pay | Admitting: *Deleted

## 2011-11-21 DIAGNOSIS — D649 Anemia, unspecified: Secondary | ICD-10-CM

## 2011-11-21 NOTE — Telephone Encounter (Signed)
lmom for pt to call back; she needs a CBC and Ferritin to check her response to tandem therapy.

## 2011-11-21 NOTE — Telephone Encounter (Signed)
Message copied by Florene Glen on Tue Nov 21, 2011 12:45 PM ------      Message from: Jarold Motto, DAVID R      Created: Mon Nov 20, 2011  4:22 PM       YES CBC AND FERRITIN      ----- Message -----         From: Linna Hoff, RN         Sent: 11/20/2011  10:31 AM           To: Mardella Layman, MD            Dr Jarold Motto, do you want a f/u or labs to assess tandem therapy? Thanks.      ----- Message -----         From: Linna Hoff, RN         Sent: 11/20/2011           To: Linna Hoff, RN            See if Dr Jarold Motto want f/u or lab in 3 months after starting tandem

## 2011-11-22 NOTE — Telephone Encounter (Signed)
Informed pt Dr Jarold Motto would like her to repeat CBC and Ferritin to evaluate her response to iron therapy. Pt reports she made an appt, but I cancelled it and she will come for labs only. Explained,depending on the results, she may have to see him; pt stated understanding.

## 2011-11-28 ENCOUNTER — Other Ambulatory Visit (INDEPENDENT_AMBULATORY_CARE_PROVIDER_SITE_OTHER): Payer: Medicare Other

## 2011-11-28 ENCOUNTER — Ambulatory Visit (INDEPENDENT_AMBULATORY_CARE_PROVIDER_SITE_OTHER): Payer: Medicare Other

## 2011-11-28 DIAGNOSIS — J309 Allergic rhinitis, unspecified: Secondary | ICD-10-CM

## 2011-11-28 DIAGNOSIS — D649 Anemia, unspecified: Secondary | ICD-10-CM

## 2011-11-28 LAB — FERRITIN: Ferritin: 18.9 ng/mL (ref 10.0–291.0)

## 2011-11-28 LAB — CBC WITH DIFFERENTIAL/PLATELET
Basophils Relative: 0.9 % (ref 0.0–3.0)
Eosinophils Relative: 3.3 % (ref 0.0–5.0)
HCT: 35.6 % — ABNORMAL LOW (ref 36.0–46.0)
Lymphs Abs: 1.5 10*3/uL (ref 0.7–4.0)
Monocytes Relative: 8.7 % (ref 3.0–12.0)
Neutrophils Relative %: 45.2 % (ref 43.0–77.0)
Platelets: 224 10*3/uL (ref 150.0–400.0)
RBC: 3.99 Mil/uL (ref 3.87–5.11)
WBC: 3.7 10*3/uL — ABNORMAL LOW (ref 4.5–10.5)

## 2011-12-05 ENCOUNTER — Telehealth: Payer: Self-pay | Admitting: Gastroenterology

## 2011-12-05 MED ORDER — FERROUS FUM-IRON POLYSACCH 162-115.2 MG PO CAPS: 1.0000 | ORAL_CAPSULE | Freq: Every day | ORAL | Status: DC

## 2011-12-05 MED ORDER — INTEGRA PLUS PO CAPS: 1.0000 | ORAL_CAPSULE | Freq: Every day | ORAL | Status: DC

## 2011-12-05 NOTE — Telephone Encounter (Signed)
Yes chronically

## 2011-12-05 NOTE — Telephone Encounter (Signed)
Informed pt she needs to remain on tandem; pt stated understanding and requested the med be ordered at [facility]. Pt called back and stated she has some GI upset with longer use of Tandem. She will try Integra for 30 days and if she does OK we will order 90 days from [facility].

## 2011-12-05 NOTE — Telephone Encounter (Signed)
Informed pt of improved labs. Pt had been on Tandem for 90 days; should she continue with daily Tandem? Thanks.

## 2011-12-05 NOTE — Telephone Encounter (Signed)
lmom for pt to call back

## 2011-12-07 ENCOUNTER — Ambulatory Visit (INDEPENDENT_AMBULATORY_CARE_PROVIDER_SITE_OTHER): Payer: Medicare Other

## 2011-12-07 DIAGNOSIS — J309 Allergic rhinitis, unspecified: Secondary | ICD-10-CM

## 2011-12-08 ENCOUNTER — Ambulatory Visit: Payer: Medicare Other | Admitting: Gastroenterology

## 2011-12-26 ENCOUNTER — Ambulatory Visit (INDEPENDENT_AMBULATORY_CARE_PROVIDER_SITE_OTHER): Payer: Medicare Other

## 2011-12-26 DIAGNOSIS — J309 Allergic rhinitis, unspecified: Secondary | ICD-10-CM

## 2012-01-15 ENCOUNTER — Ambulatory Visit (INDEPENDENT_AMBULATORY_CARE_PROVIDER_SITE_OTHER): Payer: Medicare Other

## 2012-01-15 DIAGNOSIS — J309 Allergic rhinitis, unspecified: Secondary | ICD-10-CM

## 2012-01-26 ENCOUNTER — Ambulatory Visit (INDEPENDENT_AMBULATORY_CARE_PROVIDER_SITE_OTHER): Payer: Medicare Other

## 2012-01-26 DIAGNOSIS — J309 Allergic rhinitis, unspecified: Secondary | ICD-10-CM

## 2012-02-06 ENCOUNTER — Ambulatory Visit (INDEPENDENT_AMBULATORY_CARE_PROVIDER_SITE_OTHER): Payer: Medicare Other

## 2012-02-06 DIAGNOSIS — J309 Allergic rhinitis, unspecified: Secondary | ICD-10-CM

## 2012-02-16 ENCOUNTER — Ambulatory Visit (INDEPENDENT_AMBULATORY_CARE_PROVIDER_SITE_OTHER): Payer: Medicare Other

## 2012-02-16 DIAGNOSIS — J309 Allergic rhinitis, unspecified: Secondary | ICD-10-CM

## 2012-02-16 DIAGNOSIS — Z23 Encounter for immunization: Secondary | ICD-10-CM

## 2012-02-19 DIAGNOSIS — Z23 Encounter for immunization: Secondary | ICD-10-CM

## 2012-02-27 ENCOUNTER — Ambulatory Visit (INDEPENDENT_AMBULATORY_CARE_PROVIDER_SITE_OTHER): Payer: Medicare Other

## 2012-02-27 DIAGNOSIS — J309 Allergic rhinitis, unspecified: Secondary | ICD-10-CM

## 2012-02-28 ENCOUNTER — Ambulatory Visit (INDEPENDENT_AMBULATORY_CARE_PROVIDER_SITE_OTHER): Payer: Medicare Other

## 2012-02-28 DIAGNOSIS — J309 Allergic rhinitis, unspecified: Secondary | ICD-10-CM

## 2012-03-05 ENCOUNTER — Ambulatory Visit (INDEPENDENT_AMBULATORY_CARE_PROVIDER_SITE_OTHER): Payer: Medicare Other

## 2012-03-05 DIAGNOSIS — J309 Allergic rhinitis, unspecified: Secondary | ICD-10-CM

## 2012-03-12 ENCOUNTER — Ambulatory Visit (INDEPENDENT_AMBULATORY_CARE_PROVIDER_SITE_OTHER): Payer: Medicare Other

## 2012-03-12 DIAGNOSIS — J309 Allergic rhinitis, unspecified: Secondary | ICD-10-CM

## 2012-03-22 ENCOUNTER — Ambulatory Visit (INDEPENDENT_AMBULATORY_CARE_PROVIDER_SITE_OTHER): Payer: Medicare Other

## 2012-03-22 DIAGNOSIS — J309 Allergic rhinitis, unspecified: Secondary | ICD-10-CM

## 2012-04-02 ENCOUNTER — Encounter: Payer: Self-pay | Admitting: Internal Medicine

## 2012-04-15 ENCOUNTER — Ambulatory Visit (INDEPENDENT_AMBULATORY_CARE_PROVIDER_SITE_OTHER): Payer: Medicare Other

## 2012-04-15 DIAGNOSIS — J309 Allergic rhinitis, unspecified: Secondary | ICD-10-CM

## 2012-04-30 ENCOUNTER — Ambulatory Visit (INDEPENDENT_AMBULATORY_CARE_PROVIDER_SITE_OTHER): Payer: Medicare Other

## 2012-04-30 DIAGNOSIS — J309 Allergic rhinitis, unspecified: Secondary | ICD-10-CM

## 2012-05-07 ENCOUNTER — Ambulatory Visit (INDEPENDENT_AMBULATORY_CARE_PROVIDER_SITE_OTHER): Payer: Medicare Other

## 2012-05-07 DIAGNOSIS — J309 Allergic rhinitis, unspecified: Secondary | ICD-10-CM

## 2012-05-24 ENCOUNTER — Other Ambulatory Visit: Payer: Self-pay | Admitting: Gastroenterology

## 2012-06-06 ENCOUNTER — Ambulatory Visit (INDEPENDENT_AMBULATORY_CARE_PROVIDER_SITE_OTHER): Payer: Medicare Other

## 2012-06-06 DIAGNOSIS — J309 Allergic rhinitis, unspecified: Secondary | ICD-10-CM

## 2012-06-13 ENCOUNTER — Ambulatory Visit (INDEPENDENT_AMBULATORY_CARE_PROVIDER_SITE_OTHER): Payer: Medicare Other

## 2012-06-13 DIAGNOSIS — J309 Allergic rhinitis, unspecified: Secondary | ICD-10-CM

## 2012-06-20 ENCOUNTER — Ambulatory Visit (INDEPENDENT_AMBULATORY_CARE_PROVIDER_SITE_OTHER): Payer: Medicare Other

## 2012-06-20 DIAGNOSIS — J309 Allergic rhinitis, unspecified: Secondary | ICD-10-CM

## 2012-06-27 ENCOUNTER — Ambulatory Visit: Payer: Medicare Other

## 2012-07-02 ENCOUNTER — Ambulatory Visit (INDEPENDENT_AMBULATORY_CARE_PROVIDER_SITE_OTHER): Payer: Medicare Other

## 2012-07-02 DIAGNOSIS — J309 Allergic rhinitis, unspecified: Secondary | ICD-10-CM

## 2012-07-09 ENCOUNTER — Ambulatory Visit (INDEPENDENT_AMBULATORY_CARE_PROVIDER_SITE_OTHER): Payer: Medicare Other

## 2012-07-09 DIAGNOSIS — J309 Allergic rhinitis, unspecified: Secondary | ICD-10-CM

## 2012-07-16 ENCOUNTER — Other Ambulatory Visit: Payer: Self-pay | Admitting: Gynecology

## 2012-07-16 ENCOUNTER — Ambulatory Visit (INDEPENDENT_AMBULATORY_CARE_PROVIDER_SITE_OTHER): Payer: Medicare Other

## 2012-07-16 DIAGNOSIS — J309 Allergic rhinitis, unspecified: Secondary | ICD-10-CM

## 2012-07-16 DIAGNOSIS — N63 Unspecified lump in unspecified breast: Secondary | ICD-10-CM

## 2012-07-16 DIAGNOSIS — N644 Mastodynia: Secondary | ICD-10-CM

## 2012-07-23 ENCOUNTER — Ambulatory Visit (INDEPENDENT_AMBULATORY_CARE_PROVIDER_SITE_OTHER): Payer: Medicare Other

## 2012-07-26 ENCOUNTER — Ambulatory Visit
Admission: RE | Admit: 2012-07-26 | Discharge: 2012-07-26 | Disposition: A | Discharge status: Home / Self Care | Payer: Medicare Other | Source: Ambulatory Visit | Attending: Gynecology | Admitting: Gynecology

## 2012-07-26 ENCOUNTER — Other Ambulatory Visit: Payer: Self-pay | Admitting: Gynecology

## 2012-07-26 DIAGNOSIS — N644 Mastodynia: Secondary | ICD-10-CM

## 2012-07-26 DIAGNOSIS — N63 Unspecified lump in unspecified breast: Secondary | ICD-10-CM

## 2012-07-30 ENCOUNTER — Ambulatory Visit: Payer: Medicare Other

## 2012-08-05 ENCOUNTER — Ambulatory Visit
Admission: RE | Admit: 2012-08-05 | Discharge: 2012-08-05 | Disposition: A | Discharge status: Home / Self Care | Payer: Medicare Other | Source: Ambulatory Visit | Attending: Gynecology | Admitting: Gynecology

## 2012-08-06 ENCOUNTER — Ambulatory Visit (INDEPENDENT_AMBULATORY_CARE_PROVIDER_SITE_OTHER): Payer: Medicare Other

## 2012-08-12 ENCOUNTER — Ambulatory Visit (INDEPENDENT_AMBULATORY_CARE_PROVIDER_SITE_OTHER): Payer: Medicare Other | Admitting: General Surgery

## 2012-08-12 ENCOUNTER — Encounter (INDEPENDENT_AMBULATORY_CARE_PROVIDER_SITE_OTHER): Payer: Self-pay | Admitting: General Surgery

## 2012-08-12 VITALS — BP 150/82 | HR 78 | Temp 99.4°F | Resp 18 | Ht 62.0 in | Wt 106.4 lb

## 2012-08-12 NOTE — Patient Instructions (Signed)
Your recent mammograms are abnormal, showing a focal area of distortion and density in the upper outer quadrant of the left breast.  You will be scheduled for a left breast lumpectomy with wire localization in the near future.     Lumpectomy, Breast Conserving Surgery A lumpectomy is breast surgery that removes only part of the breast. Another name used may be partial mastectomy. The amount removed varies. Make sure you understand how much of your breast will be removed. Reasons for a lumpectomy:  Any solid breast mass.  Grouped significant nodularity that may be confused with a solitary breast mass. Lumpectomy is the most common form of breast cancer surgery today. The surgeon removes the portion of your breast which contains the tumor (cancer). This is the lump. Some normal tissue around the lump is also removed to be sure that all the tumor has been removed.  If cancer cells are found in the margins where the breast tissue was removed, your surgeon will do more surgery to remove the remaining cancer tissue. This is called re-excision surgery. Radiation and/or chemotherapy treatments are often given following a lumpectomy to kill any cancer cells that could possibly remain.  REASONS YOU MAY NOT BE ABLE TO HAVE BREAST CONSERVING SURGERY:  The tumor is located in more than one place.  Your breast is small and the tumor is large so the breast would be disfigured.  The entire tumor removal is not successful with a lumpectomy.  You cannot commit to a full course of chemotherapy, radiation therapy or are pregnant and cannot have radiation.  You have previously had radiation to the breast to treat cancer. HOW A LUMPECTOMY IS PERFORMED If overnight nursing is not required following a biopsy, a lumpectomy can be performed as a same-day surgery. This can be done in a hospital, clinic, or surgical center. The anesthesia used will depend on your surgeon. They will discuss this with you. A general  anesthetic keeps you sleeping through the procedure. LET YOUR CAREGIVERS KNOW ABOUT THE FOLLOWING:  Allergies  Medications taken including herbs, eye drops, over the counter medications, and creams.  Use of steroids (by mouth or creams)  Previous problems with anesthetics or Novocaine.  Possibility of pregnancy, if this applies  History of blood clots (thrombophlebitis)  History of bleeding or blood problems.  Previous surgery  Other health problems BEFORE THE PROCEDURE You should be present one hour prior to your procedure unless directed otherwise.  AFTER THE PROCEDURE  After surgery, you will be taken to the recovery area where a nurse will watch and check your progress. Once you're awake, stable, and taking fluids well, barring other problems you will be allowed to go home.  Ice packs applied to your operative site may help with discomfort and keep the swelling down.  A small rubber drain may be placed in the breast for a couple of days to prevent a hematoma from developing in the breast.  A pressure dressing may be applied for 24 to 48 hours to prevent bleeding.  Keep the wound dry.  You may resume a normal diet and activities as directed. Avoid strenuous activities affecting the arm on the side of the biopsy site such as tennis, swimming, heavy lifting (more than 10 pounds) or pulling.  Bruising in the breast is normal following this procedure.  Wearing a bra - even to bed - may be more comfortable and also help keep the dressing on.  Change dressings as directed.  Only take over-the-counter or  prescription medicines for pain, discomfort, or fever as directed by your caregiver. Call for your results as instructed by your surgeon. Remember it is your responsibility to get the results of your lumpectomy if your surgeon asked you to follow-up. Do not assume everything is fine if you have not heard from your caregiver. SEEK MEDICAL CARE IF:   There is increased  bleeding (more than a small spot) from the wound.  You notice redness, swelling, or increasing pain in the wound.  Pus is coming from wound.  An unexplained oral temperature above 102 F (38.9 C) develops.  You notice a foul smell coming from the wound or dressing. SEEK IMMEDIATE MEDICAL CARE IF:   You develop a rash.  You have difficulty breathing.  You have any allergic problems. Document Released: 06/26/2006 Document Revised: 08/07/2011 Document Reviewed: 09/27/2006 [facility] Patient Information 2013 [place], [place].

## 2012-08-12 NOTE — Progress Notes (Signed)
Patient ID: [name], female   DOB: 1945/12/16, 67 y.o.   MRN: [id number]  Chief Complaint  Patient presents with  . New Evaluation    eval Lt br    HPI [name] is a 67 y.o. female.  She is referred by Dr. Larita Fife at the breast center of [facility] for violation surgical management of a focal area of distortion and abnormality on her left breast, upper outer quadrant.  The patient has minimal history of breast disease. She's had cysts in the past and has had several aspirations. She states that her gynecologist thought he felt something in the upper outer quadrant. She went for screening mammograms. They describe a focal area of distortion in the upper outer left breast confirmed on spot compression views. No discrete mass was identified. Ultrasound was negative. There is no palpable abnormality on the radiologist's exam. They were going to go ahead with a stereotactic core biopsy but decided against that thinking that it  the needed to be excised anyway.  Family history is negative for breast cancer or ovarian cancer.  She is generally healthy. She has allergies but denies asthma. She has iron deficiency anemia which is not as bad as it used to be. She's had abdominal hysterectomy and BSO. She's had breast cysts aspirated in the past.  HPI  Past Medical History  Diagnosis Date  . Personal history of colonic polyps   . Unspecified gastritis and gastroduodenitis without mention of hemorrhage   . Hiatal hernia   . Acute sinusitis, unspecified   . Acute pharyngitis   . Dysfunction of eustachian tube   . Esophageal reflux   . Chronic airway obstruction, not elsewhere classified   . Unspecified asthma   . Allergic rhinitis, cause unspecified   . Schatzki's ring     Past Surgical History  Procedure Laterality Date  . Laparoscopic hysterectomy    . Tonsillectomy    . Foot surgery      Family History  Problem Relation Age of Onset  . Diabetes Sister   . Allergies Sister   .  Asthma Maternal Grandfather   . Colon cancer Neg Hx   . Alzheimer's disease Father   . Allergies Brother   . Allergies Brother     Social History History  Substance Use Topics  . Smoking status: Never Smoker   . Smokeless tobacco: Never Used  . Alcohol Use: No    Allergies  Allergen Reactions  . Codeine     Current Outpatient Prescriptions  Medication Sig Dispense Refill  . CALCIUM CARBONATE-VITAMIN D PO Take 3 tablets by mouth daily.       Marland Kitchen estradiol (VIVELLE-DOT) 0.025 MG/24HR Place 1 patch onto the skin once a week.      Marland Kitchen omalizumab (XOLAIR) 150 MG injection Inject into the skin once a week.      . RABEprazole (ACIPHEX) 20 MG tablet TAKE 1 TAB DAILY 30 MINUTES BEFORE A MEAL  90 tablet  3   No current facility-administered medications for this visit.    Review of Systems Review of Systems  Constitutional: Negative for fever, chills and unexpected weight change.  HENT: Negative for hearing loss, congestion, sore throat, trouble swallowing and voice change.   Eyes: Negative for visual disturbance.  Respiratory: Negative for cough and wheezing.   Cardiovascular: Negative for chest pain, palpitations and leg swelling.  Gastrointestinal: Negative for nausea, vomiting, abdominal pain, diarrhea, constipation, blood in stool, abdominal distention and anal bleeding.  Genitourinary: Negative for hematuria,  vaginal bleeding and difficulty urinating.  Musculoskeletal: Negative for arthralgias.  Skin: Negative for rash and wound.  Neurological: Negative for seizures, syncope and headaches.  Hematological: Negative for adenopathy. Does not bruise/bleed easily.  Psychiatric/Behavioral: Negative for confusion.    Blood pressure 150/82, pulse 78, temperature 99.4 F (37.4 C), temperature source Temporal, resp. rate 18, height 5\' 2"  (1.575 m), weight 106 lb 6.4 oz (48.263 kg).  Physical Exam Physical Exam  Constitutional: She is oriented to person, place, and time. She appears  well-developed and well-nourished. No distress.  HENT:  Head: Normocephalic and atraumatic.  Nose: Nose normal.  Mouth/Throat: No oropharyngeal exudate.  Eyes: Conjunctivae and EOM are normal. Pupils are equal, round, and reactive to light. Left eye exhibits no discharge. No scleral icterus.  Neck: Neck supple. No JVD present. No tracheal deviation present. No thyromegaly present.  Cardiovascular: Normal rate, regular rhythm, normal heart sounds and intact distal pulses.   No murmur heard. Pulmonary/Chest: Effort normal and breath sounds normal. No respiratory distress. She has no wheezes. She has no rales. She exhibits no tenderness.  Breasts are small to medium size. Mild diffuse lumpiness. No focal mass. No skin change. No axillary adenopathy. Nipple areolar complexes looked normal.  Abdominal: Soft. Bowel sounds are normal. She exhibits no distension and no mass. There is no tenderness. There is no rebound and no guarding.  Musculoskeletal: She exhibits no edema and no tenderness.  Lymphadenopathy:    She has no cervical adenopathy.  Neurological: She is alert and oriented to person, place, and time. She exhibits normal muscle tone. Coordination normal.  Skin: Skin is warm. No rash noted. She is not diaphoretic. No erythema. No pallor.  Psychiatric: She has a normal mood and affect. Her behavior is normal. Judgment and thought content normal.    Data Reviewed I reviewed her imaging studies and the reports.  Assessment    Abnormal mammogram left breast, upper outer quadrant, architectural distortion. Radial scar possible. No palpable mass to correlate. This has a low but definite risk of noninvasive breast cancer, probably less than 10%.     Plan    Scheduled for left partial mastectomy with needle localization. I discussed the indications, details, technique, and numerous risks of the surgery with the patient. She understands these issues and all of her questions were answered. She  agrees with this plan.        Angelia Mould. Derrell Lolling, M.D., [facility] General and Minimally invasive Surgery Breast and Colorectal Surgery Office:   [phone] Pager:   [phone]  08/12/2012, 12:43 PM

## 2012-08-13 ENCOUNTER — Ambulatory Visit: Payer: Medicare Other

## 2012-08-13 ENCOUNTER — Encounter (HOSPITAL_COMMUNITY): Payer: Self-pay | Admitting: Pharmacy Technician

## 2012-08-15 ENCOUNTER — Ambulatory Visit (INDEPENDENT_AMBULATORY_CARE_PROVIDER_SITE_OTHER): Payer: Medicare Other

## 2012-08-20 ENCOUNTER — Encounter (HOSPITAL_COMMUNITY)
Admission: RE | Admit: 2012-08-20 | Discharge: 2012-08-20 | Disposition: A | Discharge status: Home / Self Care | Payer: Medicare Other | Source: Ambulatory Visit | Attending: General Surgery | Admitting: General Surgery

## 2012-08-20 ENCOUNTER — Encounter (HOSPITAL_COMMUNITY)
Admission: RE | Admit: 2012-08-20 | Discharge: 2012-08-20 | Disposition: A | Discharge status: Home / Self Care | Payer: Medicare Other | Source: Ambulatory Visit | Attending: Anesthesiology | Admitting: Anesthesiology

## 2012-08-20 ENCOUNTER — Encounter (HOSPITAL_COMMUNITY): Payer: Self-pay

## 2012-08-20 LAB — CBC
MCH: 29.9 pg (ref 26.0–34.0)
MCHC: 33.7 g/dL (ref 30.0–36.0)
MCV: 88.6 fL (ref 78.0–100.0)
Platelets: 217 10*3/uL (ref 150–400)
RDW: 13.3 % (ref 11.5–15.5)

## 2012-08-20 LAB — BASIC METABOLIC PANEL
BUN: 17 mg/dL (ref 6–23)
CO2: 29 mEq/L (ref 19–32)
Calcium: 8.6 mg/dL (ref 8.4–10.5)
Creatinine, Ser: 0.78 mg/dL (ref 0.50–1.10)
GFR calc non Af Amer: 85 mL/min — ABNORMAL LOW (ref >90)
Glucose, Bld: 108 mg/dL — ABNORMAL HIGH (ref 70–99)

## 2012-08-20 NOTE — Pre-Procedure Instructions (Signed)
[name]  08/20/2012   Your procedure is scheduled on:  Tuesday, April 1st  Report to [facility] at 0830 AM or once finished at breast center for needle localization.  Call this number if you have problems the morning of surgery: [phone]   Remember:   Do not eat food or drink liquids after midnight.    Take these medicines the morning of surgery with A SIP OF WATER: Aciphex   Do not wear jewelry, make-up or nail polish.  Do not wear lotions, powders, or perfumes, deodorant.  Do not shave 48 hours prior to surgery.   Do not bring valuables to the hospital.  Contacts, dentures or bridgework may not be worn into surgery.  Leave suitcase in the car. After surgery it may be brought to your room.  For patients admitted to the hospital, checkout time is 11:00 AM the day of discharge.   Patients discharged the day of surgery will not be allowed to drive home.    Special Instructions: Shower using CHG 2 nights before surgery and the night before surgery.  If you shower the day of surgery use CHG.  Use special wash - you have one bottle of CHG for all showers.  You should use approximately 1/3 of the bottle for each shower.   Please read over the following fact sheets that you were given: Pain Booklet, Coughing and Deep Breathing, MRSA Information and Surgical Site Infection Prevention

## 2012-08-21 NOTE — Progress Notes (Signed)
Anesthesia chart review: Patient is a 67 year old female scheduled for a left lumpectomy with needle localization by Dr. Derrell Lolling on 08/27/2012. History nonsmoker, hiatal hernia, asthma, COPD, iron deficiency anemia, Schatzki's ring, reflux, hysterectomy, tonsillectomy.  She has been evaluated by pulmonologist Dr. Jetty Duhamel in the past. It appears that she is still seen there for allergy injections.  EKG on 08/20/12 showed NSR, poor r wave progression, non-specific T wave abnormality (flattening)--which was new when compared to her EKG on 08/27/08.  No CV symptoms were documented at her PAT visit or at her 08/12/12 visit with Dr. Derrell Lolling.    Preoperative chest x-ray and labs noted.  Patient has no known CAD/MI/CHF or DM history.  Clinical correlation on the day of surgery.  If she remains asymptomatic from a CV standpoint then would anticipate she could proceed as planned.  Velna Ochs [facility]/Anesthesiology Phone [phone] 08/21/2012 1:30 PM

## 2012-08-22 ENCOUNTER — Ambulatory Visit: Payer: Medicare Other

## 2012-08-25 NOTE — H&P (Signed)
[name]   MRN:  [id number]   Description: 67 year old female  Provider: Ernestene Mention, MD  Department: [facility]        Diagnoses    Abnormal mammogram    -  Primary    793.80      Reason for Visit    New Evaluation    eval Lt br        Current Vitals    BP Pulse Temp(Src) Resp Ht Wt    150/82 78 99.4 F (37.4 C) (Temporal) 18 5\' 2"  (1.575 m) 106 lb 6.4 oz (48.263 kg)     BMI - 19.46 kg/m2                History and Physical   Ernestene Mention, MD    Status: Signed                        HPI [name] is a 67 y.o. female.  She is referred by Dr. Larita Fife at the breast center of [facility] for violation surgical management of a focal area of distortion and abnormality on her left breast, upper outer quadrant.   The patient has minimal history of breast disease. She's had cysts in the past and has had several aspirations. She states that her gynecologist thought he felt something in the upper outer quadrant. She went for screening mammograms. They describe a focal area of distortion in the upper outer left breast confirmed on spot compression views. No discrete mass was identified. Ultrasound was negative. There is no palpable abnormality on the radiologist's exam. They were going to go ahead with a stereotactic core biopsy but decided against that thinking that it  the needed to be excised anyway.   Family history is negative for breast cancer or ovarian cancer.   She is generally healthy. She has allergies but denies asthma. She has iron deficiency anemia which is not as bad as it used to be. She's had abdominal hysterectomy and BSO. She's had breast cysts aspirated in the past.        Past Medical History   Diagnosis  Date   .  Personal history of colonic polyps     .  Unspecified gastritis and gastroduodenitis without mention of hemorrhage     .  Hiatal hernia     .  Acute sinusitis, unspecified     .  Acute pharyngitis     .   Dysfunction of eustachian tube     .  Esophageal reflux     .  Chronic airway obstruction, not elsewhere classified     .  Unspecified asthma     .  Allergic rhinitis, cause unspecified     .  Schatzki's ring           Past Surgical History   Procedure  Laterality  Date   .  Laparoscopic hysterectomy       .  Tonsillectomy       .  Foot surgery             Family History   Problem  Relation  Age of Onset   .  Diabetes  Sister     .  Allergies  Sister     .  Asthma  Maternal Grandfather     .  Colon cancer  Neg Hx     .  Alzheimer's disease  Father     .  Allergies  Brother     .  Allergies  Brother          Social History History   Substance Use Topics   .  Smoking status:  Never Smoker    .  Smokeless tobacco:  Never Used   .  Alcohol Use:  No         Allergies   Allergen  Reactions   .  Codeine           Current Outpatient Prescriptions   Medication  Sig  Dispense  Refill   .  CALCIUM CARBONATE-VITAMIN D PO  Take 3 tablets by mouth daily.          Marland Kitchen  estradiol (VIVELLE-DOT) 0.025 MG/24HR  Place 1 patch onto the skin once a week.         Marland Kitchen  omalizumab (XOLAIR) 150 MG injection  Inject into the skin once a week.         .  RABEprazole (ACIPHEX) 20 MG tablet  TAKE 1 TAB DAILY 30 MINUTES BEFORE A MEAL   90 tablet   3         Review of Systems  Constitutional: Negative for fever, chills and unexpected weight change.  HENT: Negative for hearing loss, congestion, sore throat, trouble swallowing and voice change.   Eyes: Negative for visual disturbance.  Respiratory: Negative for cough and wheezing.   Cardiovascular: Negative for chest pain, palpitations and leg swelling.  Gastrointestinal: Negative for nausea, vomiting, abdominal pain, diarrhea, constipation, blood in stool, abdominal distention and anal bleeding.  Genitourinary: Negative for hematuria, vaginal bleeding and difficulty urinating.  Musculoskeletal: Negative for arthralgias.  Skin:  Negative for rash and wound.  Neurological: Negative for seizures, syncope and headaches.  Hematological: Negative for adenopathy. Does not bruise/bleed easily.  Psychiatric/Behavioral: Negative for confusion.      Blood pressure 150/82, pulse 78, temperature 99.4 F (37.4 C), temperature source Temporal, resp. rate 18, height 5\' 2"  (1.575 m), weight 106 lb 6.4 oz (48.263 kg).   Physical Exam   Constitutional: She is oriented to person, place, and time. She appears well-developed and well-nourished. No distress.  HENT:   Head: Normocephalic and atraumatic.   Nose: Nose normal.   Mouth/Throat: No oropharyngeal exudate.  Eyes: Conjunctivae and EOM are normal. Pupils are equal, round, and reactive to light. Left eye exhibits no discharge. No scleral icterus.  Neck: Neck supple. No JVD present. No tracheal deviation present. No thyromegaly present.  Cardiovascular: Normal rate, regular rhythm, normal heart sounds and intact distal pulses.    No murmur heard. Pulmonary/Chest: Effort normal and breath sounds normal. No respiratory distress. She has no wheezes. She has no rales. She exhibits no tenderness.  Breasts are small to medium size. Mild diffuse lumpiness. No focal mass. No skin change. No axillary adenopathy. Nipple areolar complexes looked normal.  Abdominal: Soft. Bowel sounds are normal. She exhibits no distension and no mass. There is no tenderness. There is no rebound and no guarding.  Musculoskeletal: She exhibits no edema and no tenderness.  Lymphadenopathy:    She has no cervical adenopathy.  Neurological: She is alert and oriented to person, place, and time. She exhibits normal muscle tone. Coordination normal.  Skin: Skin is warm. No rash noted. She is not diaphoretic. No erythema. No pallor.  Psychiatric: She has a normal mood and affect. Her behavior is normal. Judgment and thought content normal.      Data Reviewed I reviewed her imaging studies  and the  reports.   Assessment    Abnormal mammogram left breast, upper outer quadrant, architectural distortion. Radial scar possible. No palpable mass to correlate. This has a low but definite risk of noninvasive breast cancer, probably less than 10%.      Plan    Scheduled for left partial mastectomy with needle localization. I discussed the indications, details, technique, and numerous risks of the surgery with the patient. She understands these issues and all of her questions were answered. She agrees with this plan.           Angelia Mould. Derrell Lolling, M.D., [facility] General and Minimally invasive Surgery Breast and Colorectal Surgery Office:   [phone] Pager:   [phone]

## 2012-08-26 MED ORDER — CEFAZOLIN SODIUM-DEXTROSE 2-3 GM-% IV SOLR
2.0000 g | INTRAVENOUS | Status: AC
  Administered 2012-08-27: 2 g via INTRAVENOUS
  Filled 2012-08-26: qty 50

## 2012-08-27 ENCOUNTER — Encounter (HOSPITAL_COMMUNITY): Payer: Self-pay

## 2012-08-27 ENCOUNTER — Ambulatory Visit
Admission: RE | Admit: 2012-08-27 | Discharge: 2012-08-27 | Disposition: A | Discharge status: Home / Self Care | Payer: Medicare Other | Source: Ambulatory Visit | Attending: General Surgery | Admitting: General Surgery

## 2012-08-27 ENCOUNTER — Ambulatory Visit (HOSPITAL_COMMUNITY): Payer: Medicare Other | Admitting: Certified Registered Nurse Anesthetist

## 2012-08-27 ENCOUNTER — Ambulatory Visit (HOSPITAL_COMMUNITY)
Admission: RE | Admit: 2012-08-27 | Discharge: 2012-08-27 | Disposition: A | Discharge status: Home / Self Care | Payer: Medicare Other | Source: Ambulatory Visit | Attending: General Surgery | Admitting: General Surgery

## 2012-08-27 ENCOUNTER — Encounter (HOSPITAL_COMMUNITY)
Admission: RE | Disposition: A | Discharge status: Home / Self Care | Payer: Self-pay | Source: Ambulatory Visit | Attending: General Surgery

## 2012-08-27 ENCOUNTER — Encounter (HOSPITAL_COMMUNITY): Payer: Self-pay | Admitting: Vascular Surgery

## 2012-08-27 DIAGNOSIS — J4489 Other specified chronic obstructive pulmonary disease: Secondary | ICD-10-CM | POA: Insufficient documentation

## 2012-08-27 DIAGNOSIS — Z79899 Other long term (current) drug therapy: Secondary | ICD-10-CM | POA: Insufficient documentation

## 2012-08-27 DIAGNOSIS — K2970 Gastritis, unspecified, without bleeding: Secondary | ICD-10-CM | POA: Insufficient documentation

## 2012-08-27 DIAGNOSIS — K219 Gastro-esophageal reflux disease without esophagitis: Secondary | ICD-10-CM | POA: Insufficient documentation

## 2012-08-27 DIAGNOSIS — R928 Other abnormal and inconclusive findings on diagnostic imaging of breast: Secondary | ICD-10-CM

## 2012-08-27 DIAGNOSIS — N6089 Other benign mammary dysplasias of unspecified breast: Secondary | ICD-10-CM | POA: Insufficient documentation

## 2012-08-27 DIAGNOSIS — K222 Esophageal obstruction: Secondary | ICD-10-CM | POA: Insufficient documentation

## 2012-08-27 DIAGNOSIS — Z860100 Personal history of colon polyps, unspecified: Secondary | ICD-10-CM | POA: Insufficient documentation

## 2012-08-27 DIAGNOSIS — K449 Diaphragmatic hernia without obstruction or gangrene: Secondary | ICD-10-CM | POA: Insufficient documentation

## 2012-08-27 DIAGNOSIS — J309 Allergic rhinitis, unspecified: Secondary | ICD-10-CM | POA: Insufficient documentation

## 2012-08-27 DIAGNOSIS — J449 Chronic obstructive pulmonary disease, unspecified: Secondary | ICD-10-CM | POA: Insufficient documentation

## 2012-08-27 HISTORY — PX: PARTIAL MASTECTOMY WITH NEEDLE LOCALIZATION: SHX6008

## 2012-08-27 SURGERY — PARTIAL MASTECTOMY WITH NEEDLE LOCALIZATION
Anesthesia: General | Laterality: Left | Wound class: Clean

## 2012-08-27 MED ORDER — CHLORHEXIDINE GLUCONATE 4 % EX LIQD: 1.0000 "application " | Freq: Once | CUTANEOUS | Status: DC

## 2012-08-27 MED ORDER — PROMETHAZINE HCL 25 MG/ML IJ SOLN: 6.2500 mg | INTRAMUSCULAR | Status: DC | PRN

## 2012-08-27 MED ORDER — LACTATED RINGERS IV SOLN
INTRAVENOUS | Status: DC | PRN
  Administered 2012-08-27: 10:00:00 via INTRAVENOUS

## 2012-08-27 MED ORDER — 0.9 % SODIUM CHLORIDE (POUR BTL) OPTIME
TOPICAL | Status: DC | PRN
  Administered 2012-08-27: 1000 mL

## 2012-08-27 MED ORDER — BUPIVACAINE-EPINEPHRINE 0.25% -1:200000 IJ SOLN
INTRAMUSCULAR | Status: DC | PRN
  Administered 2012-08-27: 50 mL

## 2012-08-27 MED ORDER — HYDROMORPHONE HCL PF 1 MG/ML IJ SOLN: 0.2500 mg | INTRAMUSCULAR | Status: DC | PRN

## 2012-08-27 MED ORDER — ONDANSETRON HCL 4 MG/2ML IJ SOLN
INTRAMUSCULAR | Status: DC | PRN
  Administered 2012-08-27: 4 mg via INTRAVENOUS

## 2012-08-27 MED ORDER — LIDOCAINE HCL (CARDIAC) 20 MG/ML IV SOLN
INTRAVENOUS | Status: DC | PRN
  Administered 2012-08-27: 100 mg via INTRAVENOUS

## 2012-08-27 MED ORDER — FENTANYL CITRATE 0.05 MG/ML IJ SOLN
INTRAMUSCULAR | Status: DC | PRN
  Administered 2012-08-27: 50 ug via INTRAVENOUS

## 2012-08-27 MED ORDER — BUPIVACAINE-EPINEPHRINE 0.25% -1:200000 IJ SOLN
INTRAMUSCULAR | Status: AC
  Filled 2012-08-27: qty 1

## 2012-08-27 MED ORDER — MIDAZOLAM HCL 5 MG/5ML IJ SOLN
INTRAMUSCULAR | Status: DC | PRN
  Administered 2012-08-27: 2 mg via INTRAVENOUS

## 2012-08-27 MED ORDER — HYDROCODONE-ACETAMINOPHEN 5-325 MG PO TABS: 1.0000 | ORAL_TABLET | ORAL | Status: DC | PRN

## 2012-08-27 MED ORDER — PROPOFOL 10 MG/ML IV BOLUS
INTRAVENOUS | Status: DC | PRN
  Administered 2012-08-27: 60 mg via INTRAVENOUS
  Administered 2012-08-27: 100 mg via INTRAVENOUS

## 2012-08-27 SURGICAL SUPPLY — 41 items
ADH SKN CLS APL DERMABOND .7 (GAUZE/BANDAGES/DRESSINGS) ×1
BLADE SURG 10 STRL SS (BLADE) ×2 IMPLANT
BLADE SURG 15 STRL LF DISP TIS (BLADE) ×1 IMPLANT
BLADE SURG 15 STRL SS (BLADE) ×2
CANISTER SUCTION 2500CC (MISCELLANEOUS) ×2 IMPLANT
CHLORAPREP W/TINT 26ML (MISCELLANEOUS) ×2 IMPLANT
CLOTH BEACON ORANGE TIMEOUT ST (SAFETY) ×2 IMPLANT
COVER SURGICAL LIGHT HANDLE (MISCELLANEOUS) ×2 IMPLANT
DECANTER SPIKE VIAL GLASS SM (MISCELLANEOUS) ×1 IMPLANT
DERMABOND ADVANCED (GAUZE/BANDAGES/DRESSINGS) ×1
DERMABOND ADVANCED .7 DNX12 (GAUZE/BANDAGES/DRESSINGS) ×1 IMPLANT
DEVICE DUBIN SPECIMEN MAMMOGRA (MISCELLANEOUS) ×2 IMPLANT
DRAPE CHEST BREAST 15X10 FENES (DRAPES) ×2 IMPLANT
DRAPE UTILITY 15X26 W/TAPE STR (DRAPE) ×5 IMPLANT
ELECT CAUTERY BLADE 6.4 (BLADE) ×2 IMPLANT
ELECT REM PT RETURN 9FT ADLT (ELECTROSURGICAL) ×2
ELECTRODE REM PT RTRN 9FT ADLT (ELECTROSURGICAL) ×1 IMPLANT
GLOVE BIOGEL PI IND STRL 7.0 (GLOVE) IMPLANT
GLOVE BIOGEL PI INDICATOR 7.0 (GLOVE) ×2
GLOVE EUDERMIC 7 POWDERFREE (GLOVE) ×3 IMPLANT
GOWN PREVENTION PLUS XLARGE (GOWN DISPOSABLE) ×2 IMPLANT
GOWN STRL NON-REIN LRG LVL3 (GOWN DISPOSABLE) ×2 IMPLANT
KIT BASIN OR (CUSTOM PROCEDURE TRAY) ×2 IMPLANT
KIT MARKER MARGIN INK (KITS) ×2 IMPLANT
KIT ROOM TURNOVER OR (KITS) ×2 IMPLANT
NDL HYPO 25GX1X1/2 BEV (NEEDLE) ×1 IMPLANT
NEEDLE HYPO 25GX1X1/2 BEV (NEEDLE) ×4 IMPLANT
NS IRRIG 1000ML POUR BTL (IV SOLUTION) ×2 IMPLANT
PACK SURGICAL SETUP 50X90 (CUSTOM PROCEDURE TRAY) ×2 IMPLANT
PAD ARMBOARD 7.5X6 YLW CONV (MISCELLANEOUS) ×2 IMPLANT
PENCIL BUTTON HOLSTER BLD 10FT (ELECTRODE) ×2 IMPLANT
SPONGE LAP 18X18 X RAY DECT (DISPOSABLE) ×2 IMPLANT
SUT MNCRL AB 4-0 PS2 18 (SUTURE) ×2 IMPLANT
SUT SILK 2 0 SH (SUTURE) ×1 IMPLANT
SUT VIC AB 3-0 SH 18 (SUTURE) ×2 IMPLANT
SYR BULB 3OZ (MISCELLANEOUS) ×2 IMPLANT
SYR CONTROL 10ML LL (SYRINGE) ×2 IMPLANT
TOWEL OR 17X24 6PK STRL BLUE (TOWEL DISPOSABLE) ×2 IMPLANT
TOWEL OR 17X26 10 PK STRL BLUE (TOWEL DISPOSABLE) ×2 IMPLANT
TUBE CONNECTING 12X1/4 (SUCTIONS) ×2 IMPLANT
YANKAUER SUCT BULB TIP NO VENT (SUCTIONS) ×2 IMPLANT

## 2012-08-27 NOTE — Anesthesia Procedure Notes (Signed)
Procedure Name: LMA Insertion Date/Time: 08/27/2012 10:35 AM Performed by: Margaree Mackintosh Pre-anesthesia Checklist: Patient identified, Timeout performed, Emergency Drugs available, Suction available and Patient being monitored Patient Re-evaluated:Patient Re-evaluated prior to inductionOxygen Delivery Method: Circle system utilized Preoxygenation: Pre-oxygenation with 100% oxygen Intubation Type: IV induction Ventilation: Mask ventilation without difficulty LMA: LMA inserted LMA Size: 3.0 Number of attempts: 2 Placement Confirmation: positive ETCO2 and breath sounds checked- equal and bilateral Tube secured with: Tape Dental Injury: Teeth and Oropharynx as per pre-operative assessment

## 2012-08-27 NOTE — Preoperative (Signed)
Beta Blockers   Reason not to administer Beta Blockers:Not Applicable 

## 2012-08-27 NOTE — Transfer of Care (Signed)
Immediate Anesthesia Transfer of Care Note  Patient: [name]  Procedure(s) Performed: Procedure(s): PARTIAL MASTECTOMY WITH NEEDLE LOCALIZATION (Left)  Patient Location: PACU  Anesthesia Type:General  Level of Consciousness: awake, alert  and oriented  Airway & Oxygen Therapy: Patient Spontanous Breathing and Patient connected to face mask oxygen  Post-op Assessment: Report given to PACU RN, Post -op Vital signs reviewed and stable and Patient moving all extremities  Post vital signs: Reviewed and stable  Complications: No apparent anesthesia complications

## 2012-08-27 NOTE — Anesthesia Postprocedure Evaluation (Signed)
Anesthesia Post Note  Patient: [name]  Procedure(s) Performed: Procedure(s) (LRB): PARTIAL MASTECTOMY WITH NEEDLE LOCALIZATION (Left)  Anesthesia type: general  Patient location: PACU  Post pain: Pain level controlled  Post assessment: Patient's Cardiovascular Status Stable  Last Vitals:  Filed Vitals:   08/27/12 1347  BP: 131/74  Pulse: 83  Temp: 36.4 C  Resp: 16    Post vital signs: Reviewed and stable  Level of consciousness: sedated  Complications: No apparent anesthesia complications

## 2012-08-27 NOTE — Anesthesia Preprocedure Evaluation (Addendum)
Anesthesia Evaluation  Patient identified by MRN, date of birth, ID band Patient awake    Reviewed: Allergy & Precautions, H&P , NPO status , Patient's Chart, lab work & pertinent test results  Airway Mallampati: II TM Distance: >3 FB Neck ROM: Full    Dental  (+) Teeth Intact and Dental Advisory Given   Pulmonary asthma , COPD   Pulmonary exam normal       Cardiovascular negative cardio ROS      Neuro/Psych negative neurological ROS  negative psych ROS   GI/Hepatic Neg liver ROS, hiatal hernia, GERD-  Medicated,  Endo/Other  negative endocrine ROS  Renal/GU negative Renal ROS     Musculoskeletal   Abdominal   Peds  Hematology   Anesthesia Other Findings   Reproductive/Obstetrics                          Anesthesia Physical Anesthesia Plan  ASA: II  Anesthesia Plan: General   Post-op Pain Management:    Induction: Intravenous  Airway Management Planned: LMA  Additional Equipment:   Intra-op Plan:   Post-operative Plan: Extubation in OR  Informed Consent: I have reviewed the patients History and Physical, chart, labs and discussed the procedure including the risks, benefits and alternatives for the proposed anesthesia with the patient or authorized representative who has indicated his/her understanding and acceptance.   Dental advisory given  Plan Discussed with: CRNA, Anesthesiologist and Surgeon  Anesthesia Plan Comments:        Anesthesia Quick Evaluation

## 2012-08-27 NOTE — Interval H&P Note (Signed)
History and Physical Interval Note:  08/27/2012 10:10 AM  [name]  has presented today for surgery, with the diagnosis of Abnormal mammogram left breast  The goals and the various methods of treatment have been discussed with the patient and family. After consideration of risks, benefits and other options for treatment, the patient has consented to  Procedure(s): PARTIAL MASTECTOMY WITH NEEDLE LOCALIZATION (Left) as a surgical intervention .  The patient's history has been reviewed, patient examined today, no change in status, stable for surgery.  I have reviewed the patient's chart and labs.  Questions were answered to the patient's satisfaction.     Ernestene Mention

## 2012-08-27 NOTE — Op Note (Signed)
Patient Name:           [name]   Date of Surgery:        08/27/2012  Pre op Diagnosis:      Abnormal mammogram left breast, upper outer quadrant, architectural distortion  Post op Diagnosis:    Same  Procedure:                 Left partial mastectomy with needle localization and margin assessment  Surgeon:                     Angelia Mould. Derrell Lolling, M.D., FACS  Assistant:                      None  Operative Indications:   [name] is a 67 y.o. female. She is referred by  the breast center of [facility] for evaluation and  surgical management of a focal area of distortion and abnormality on her left breast, upper outer quadrant.  The patient has minimal history of breast disease. She's had cysts in the past and has had several aspirations. She states that her gynecologist thought he felt something in the upper outer quadrant. She went for screening mammograms. They describe a focal area of distortion in the upper outer left breast confirmed on spot compression views. No discrete mass was identified. Ultrasound was negative. There is no palpable abnormality on the radiologist's exam. They were going to go ahead with a stereotactic core biopsy but decided against that thinking that it the needed to be excised anyway. Physical exam reveals medium-size breasts, Mild diffuse lumpiness, no mass, no skin changes, no axillary adenopathy. Family history is negative for breast cancer or ovarian cancer.  She is generally healthy. She has allergies but denies asthma. She has iron deficiency anemia which is not as bad as it used to be. She's had abdominal hysterectomy and BSO.    Operative Findings:       The localizing wire entered the left breast in the far lateral position and was directed medially. The specimen mammogram showed the entire wire and the area of architectural distortion to be contained within the specimen.  There were no gross palpable abnormalities  Procedure in Detail:           Following the wire localization procedure the patient was brought to the operating room at [facility] where a general anesthetic was performed. The left breast was prepped and draped in a sterile fashion. Intravenous antibiotics were given. Surgical time out was performed. 0.5% Marcaine with epinephrine was used as a local infiltration anesthetic. A curvilinear incision was made laterally in the left breast over the insertion site of the wire. Dissection was carried down into the breast tissue around the wire. The specimen was removed and marked with silk sutures to orient the pathologist. The specimen mammogram looked good and it appeared that the entire wire and area of architectural distortion had been removed. This was discussed with Dr. Deboraha Sprang. Specimen was marked and sent to the pathology lab. The wound was irrigated with saline. Hemostasis was excellent. The deeper breast tissues were closed with interrupted sutures of 3-0 Vicryl and the skin closed with a running subcuticular suture of 4-0 Monocryl and Dermabond. Patient tolerated procedure well was taken to recovery room in stable condition. EBL 10 cc. Counts correct. Complications none.     Angelia Mould. Derrell Lolling, M.D., FACS General and Minimally Invasive Surgery Breast and Colorectal Surgery  08/27/2012  11:12 AM

## 2012-08-29 ENCOUNTER — Encounter (HOSPITAL_COMMUNITY): Payer: Self-pay | Admitting: General Surgery

## 2012-08-29 ENCOUNTER — Telehealth (INDEPENDENT_AMBULATORY_CARE_PROVIDER_SITE_OTHER): Payer: Self-pay | Admitting: General Surgery

## 2012-08-29 ENCOUNTER — Telehealth (INDEPENDENT_AMBULATORY_CARE_PROVIDER_SITE_OTHER): Payer: Self-pay

## 2012-08-29 NOTE — Telephone Encounter (Signed)
Pt called to ask about her very sore throat.  Had surgery 08/27/12.  Explained she had an ET tube, which causes localized irritation.  Denies fever or any other viral symptoms.  Suggested she drink warm fluids often and gargle with warm salt water 3-4 times a day.  Also it could be drainage from her allergies, which would respond positively to this treatment as well.  Pt understands and will comply.

## 2012-08-29 NOTE — Telephone Encounter (Signed)
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 I will discuss this with her in detail and her first postop visit. ------

## 2012-08-29 NOTE — Progress Notes (Signed)
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 I will discuss this with her in detail and her first postop visit. ______

## 2012-08-29 NOTE — Telephone Encounter (Signed)
I called and let the patient know of her pathology results per Dr Derrell Lolling.  She has a follow up on 4/17.

## 2012-09-06 ENCOUNTER — Ambulatory Visit: Payer: Medicare Other

## 2012-09-09 ENCOUNTER — Encounter: Payer: Self-pay | Admitting: Internal Medicine

## 2012-09-10 ENCOUNTER — Ambulatory Visit (INDEPENDENT_AMBULATORY_CARE_PROVIDER_SITE_OTHER): Payer: Medicare Other

## 2012-09-10 DIAGNOSIS — J309 Allergic rhinitis, unspecified: Secondary | ICD-10-CM

## 2012-09-12 ENCOUNTER — Ambulatory Visit (INDEPENDENT_AMBULATORY_CARE_PROVIDER_SITE_OTHER): Payer: Medicare Other | Admitting: General Surgery

## 2012-09-12 ENCOUNTER — Encounter (INDEPENDENT_AMBULATORY_CARE_PROVIDER_SITE_OTHER): Payer: Self-pay | Admitting: General Surgery

## 2012-09-12 VITALS — BP 120/82 | HR 84 | Resp 18 | Ht 62.0 in | Wt 103.0 lb

## 2012-09-12 DIAGNOSIS — N6099 Unspecified benign mammary dysplasia of unspecified breast: Secondary | ICD-10-CM

## 2012-09-12 DIAGNOSIS — N6089 Other benign mammary dysplasias of unspecified breast: Secondary | ICD-10-CM

## 2012-09-12 NOTE — Progress Notes (Signed)
Patient ID: [name], female   DOB: 07-Mar-1946, 67 y.o.   MRN: [id number] History: This patient underwent left partial mastectomy with needle localization on 08/27/2012. 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 I have discussed this with the patient in detail and given her a copy of the pathology report. She says she is still sore but otherwise doing okay. She want to know when  she can exercise  and mow her lawn.  Exam: Patient looks well. No distress. Blood pressure is healing incision laterally. Some thickening and some tenderness but no infection, no drainage, no hematoma, no seroma. 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 Recovering uneventfully following a partial mastectomy with needle localization  Plan: I discussed the pathology report with her in detail. She is advised that  she was at slightly increased risk for breast cancer long-term. She has a negative family history. I strong urged her to get annual breast exams annual mammograms she states she will do that Activities discussed. No restriction after April 21. Return to see me if further problems arise.   Angelia Mould. Derrell Lolling, M.D., [facility] General and Minimally invasive Surgery Breast and Colorectal Surgery Office:   [phone] Pager:   [phone]

## 2012-09-12 NOTE — Patient Instructions (Signed)
Your recent left breast biopsy site is healing with that any obvious surgical complications. The tenderness should resolve within a month and thickening should resolve within this 6 months.  The final pathology report showed atypical ductal hyperplasia, but no cancer. Nothing further needs to be done. This does slightly increase your risk for breast cancer long term. You are strongly advised to get an annual breast exam by your physician, and  to get annual mammograms from here on out.  Return to see Dr. Derrell Lolling if further problems arise.

## 2012-09-19 ENCOUNTER — Ambulatory Visit (INDEPENDENT_AMBULATORY_CARE_PROVIDER_SITE_OTHER): Payer: Medicare Other

## 2012-09-19 DIAGNOSIS — J309 Allergic rhinitis, unspecified: Secondary | ICD-10-CM

## 2012-09-20 ENCOUNTER — Ambulatory Visit (INDEPENDENT_AMBULATORY_CARE_PROVIDER_SITE_OTHER): Payer: Medicare Other

## 2012-09-20 DIAGNOSIS — J309 Allergic rhinitis, unspecified: Secondary | ICD-10-CM

## 2012-09-26 ENCOUNTER — Ambulatory Visit (INDEPENDENT_AMBULATORY_CARE_PROVIDER_SITE_OTHER): Payer: Medicare Other

## 2012-09-26 DIAGNOSIS — J309 Allergic rhinitis, unspecified: Secondary | ICD-10-CM

## 2012-09-27 ENCOUNTER — Ambulatory Visit: Payer: Medicare Other

## 2012-10-03 ENCOUNTER — Ambulatory Visit: Payer: Medicare Other

## 2012-10-08 ENCOUNTER — Ambulatory Visit (INDEPENDENT_AMBULATORY_CARE_PROVIDER_SITE_OTHER): Payer: Medicare Other

## 2012-10-08 DIAGNOSIS — J309 Allergic rhinitis, unspecified: Secondary | ICD-10-CM

## 2012-10-15 ENCOUNTER — Ambulatory Visit (INDEPENDENT_AMBULATORY_CARE_PROVIDER_SITE_OTHER): Payer: Medicare Other

## 2012-10-15 DIAGNOSIS — J309 Allergic rhinitis, unspecified: Secondary | ICD-10-CM

## 2012-10-20 ENCOUNTER — Encounter (HOSPITAL_COMMUNITY): Payer: Self-pay | Admitting: *Deleted

## 2012-10-20 ENCOUNTER — Emergency Department (HOSPITAL_COMMUNITY)
Admission: EM | Admit: 2012-10-20 | Discharge: 2012-10-20 | Disposition: A | Discharge status: Home / Self Care | Payer: Medicare Other | Attending: Emergency Medicine | Admitting: Emergency Medicine

## 2012-10-20 DIAGNOSIS — Y9301 Activity, walking, marching and hiking: Secondary | ICD-10-CM | POA: Insufficient documentation

## 2012-10-20 DIAGNOSIS — K219 Gastro-esophageal reflux disease without esophagitis: Secondary | ICD-10-CM | POA: Insufficient documentation

## 2012-10-20 DIAGNOSIS — Z860100 Personal history of colon polyps, unspecified: Secondary | ICD-10-CM | POA: Insufficient documentation

## 2012-10-20 DIAGNOSIS — Z8709 Personal history of other diseases of the respiratory system: Secondary | ICD-10-CM | POA: Insufficient documentation

## 2012-10-20 DIAGNOSIS — Z8719 Personal history of other diseases of the digestive system: Secondary | ICD-10-CM | POA: Insufficient documentation

## 2012-10-20 DIAGNOSIS — S0180XA Unspecified open wound of other part of head, initial encounter: Secondary | ICD-10-CM | POA: Insufficient documentation

## 2012-10-20 DIAGNOSIS — Q391 Atresia of esophagus with tracheo-esophageal fistula: Secondary | ICD-10-CM | POA: Insufficient documentation

## 2012-10-20 DIAGNOSIS — W010XXA Fall on same level from slipping, tripping and stumbling without subsequent striking against object, initial encounter: Secondary | ICD-10-CM | POA: Insufficient documentation

## 2012-10-20 DIAGNOSIS — Z79899 Other long term (current) drug therapy: Secondary | ICD-10-CM | POA: Insufficient documentation

## 2012-10-20 DIAGNOSIS — IMO0002 Reserved for concepts with insufficient information to code with codable children: Secondary | ICD-10-CM | POA: Insufficient documentation

## 2012-10-20 DIAGNOSIS — J45909 Unspecified asthma, uncomplicated: Secondary | ICD-10-CM | POA: Insufficient documentation

## 2012-10-20 DIAGNOSIS — Q393 Congenital stenosis and stricture of esophagus: Secondary | ICD-10-CM | POA: Insufficient documentation

## 2012-10-20 DIAGNOSIS — Y92009 Unspecified place in unspecified non-institutional (private) residence as the place of occurrence of the external cause: Secondary | ICD-10-CM | POA: Insufficient documentation

## 2012-10-20 DIAGNOSIS — Z87898 Personal history of other specified conditions: Secondary | ICD-10-CM | POA: Insufficient documentation

## 2012-10-20 DIAGNOSIS — Z23 Encounter for immunization: Secondary | ICD-10-CM | POA: Insufficient documentation

## 2012-10-20 MED ORDER — LIDOCAINE-EPINEPHRINE 2 %-1:100000 IJ SOLN: 20.0000 mL | Freq: Once | INTRAMUSCULAR | Status: DC

## 2012-10-20 MED ORDER — TETANUS-DIPHTHERIA TOXOIDS TD 5-2 LFU IM INJ
0.5000 mL | INJECTION | Freq: Once | INTRAMUSCULAR | Status: AC
  Administered 2012-10-20: 0.5 mL via INTRAMUSCULAR
  Filled 2012-10-20: qty 0.5

## 2012-10-20 NOTE — ED Provider Notes (Signed)
History  This chart was scribed for non-physician practitioner Wynetta Emery, PA-C working with Toy Baker, MD, by Candelaria Stagers, ED Scribe. This patient was seen in room WTR5/WTR5 and the patient's care was started at 8:54 PM   CSN: [id number]  Arrival date & time 10/20/12  1919   First MD Initiated Contact with Patient 10/20/12 1924      Chief Complaint  Patient presents with  . Facial Laceration     The history is provided by the patient. No language interpreter was used.   HPI Comments: [name] is a 67 y.o. female who presents to the Emergency Department complaining of a laceration above the left eye after falling on a loose patio block cutting the face on her glasses that broke on impact.  She denies LOC, nausea, or vomiting.  Pt has abrasion to her left knee.  She denies current neck pain or headache.  She has taken nothing for the pain.  Bleeding is controlled.  Last tetanus unknown.    Past Medical History  Diagnosis Date  . Personal history of colonic polyps   . Unspecified gastritis and gastroduodenitis without mention of hemorrhage   . Hiatal hernia   . Acute sinusitis, unspecified   . Acute pharyngitis   . Dysfunction of eustachian tube   . Esophageal reflux   . Chronic airway obstruction, not elsewhere classified   . Unspecified asthma   . Allergic rhinitis, cause unspecified   . Schatzki's ring     Past Surgical History  Procedure Laterality Date  . Laparoscopic hysterectomy    . Tonsillectomy    . Foot surgery    . Cyst removal hand    . Abdominal hysterectomy    . Partial mastectomy with needle localization Left 08/27/2012    Procedure: PARTIAL MASTECTOMY WITH NEEDLE LOCALIZATION;  Surgeon: Ernestene Mention, MD;  Location: [facility] OR;  Service: General;  Laterality: Left;    Family History  Problem Relation Age of Onset  . Diabetes Sister   . Allergies Sister   . Asthma Maternal Grandfather   . Colon cancer Neg Hx   . Alzheimer's disease  Father   . Allergies Brother   . Allergies Brother     History  Substance Use Topics  . Smoking status: Never Smoker   . Smokeless tobacco: Never Used  . Alcohol Use: No    OB History   Grav Para Term Preterm Abortions TAB SAB Ect Mult Living                  Review of Systems  Constitutional: Negative for fever.  Respiratory: Negative for shortness of breath.   Cardiovascular: Negative for chest pain.  Gastrointestinal: Negative for nausea, vomiting, abdominal pain and diarrhea.  Skin: Positive for wound (laceration above left eye).  All other systems reviewed and are negative.    Allergies  Shellfish allergy and Codeine  Home Medications   Current Outpatient Rx  Name  Route  Sig  Dispense  Refill  . calcium-vitamin D (OSCAL WITH D) 500-200 MG-UNIT per tablet   Oral   Take 1 tablet by mouth daily.         Marland Kitchen estradiol (VIVELLE-DOT) 0.025 MG/24HR   Transdermal   Place 1 patch onto the skin 2 (two) times a week.         . Multiple Vitamin (MULTIVITAMIN WITH MINERALS) TABS   Oral   Take 1 tablet by mouth daily.         Marland Kitchen  omalizumab (XOLAIR) 150 MG injection   Subcutaneous   Inject 150 mg into the skin once a week.          Marland Kitchen OVER THE COUNTER MEDICATION   Oral   Take 4 capsules by mouth 2 (two) times daily. Juice plus [place], Veggie, Complete         . RABEprazole (ACIPHEX) 20 MG tablet   Oral   Take 20 mg by mouth daily before breakfast.           BP 131/76  Pulse 79  Temp(Src) 98.4 F (36.9 C) (Oral)  Resp 18  Wt 104 lb (47.174 kg)  BMI 19.02 kg/m2  SpO2 99%  Physical Exam  Nursing note and vitals reviewed. Constitutional: She is oriented to person, place, and time. She appears well-developed and well-nourished. No distress.  HENT:  Head: Normocephalic.  Mouth/Throat: Oropharynx is clear and moist.  Eyes: Conjunctivae and EOM are normal. Pupils are equal, round, and reactive to light.  EOM intact with no pain or diplopia.  No  crepitus to orbital.    Neck: Normal range of motion.  No midline tenderness to palpation or step-offs appreciated. Patient has full range of motion without pain.   Cardiovascular: Normal rate, regular rhythm and intact distal pulses.   Pulmonary/Chest: Effort normal and breath sounds normal. No stridor. No respiratory distress. She has no wheezes. She has no rales. She exhibits no tenderness.  Abdominal: Soft. Bowel sounds are normal. She exhibits no distension and no mass. There is no tenderness. There is no rebound and no guarding.  Musculoskeletal: Normal range of motion.  Neurological: She is alert and oriented to person, place, and time.  Follows commands, Goal oriented speech, Strength is 5 out of 5x4 extremities, patient ambulates with a coordinated in nonantalgic gait. Sensation is grossly intact.   Skin:  2 cm full thickness non ragged laceration to lateral left eyebrow.   Psychiatric: She has a normal mood and affect.    ED Course  Procedures   DIAGNOSTIC STUDIES: Oxygen Saturation is 99% on room air, normal by my interpretation.    COORDINATION OF CARE:  8:51 PM Discussed course of care with pt which includes laceration repair.  Advised pt to apply sunscreen to minimize scarring.  Pt understands and agrees.   LACERATION REPAIR PROCEDURE NOTE The patient's identification was confirmed and consent was obtained. This procedure was performed by Wynetta Emery, PA-C at 9:01 PM. Site: lateral left eyebrow Sterile procedures observed Anesthetic used (type and amt): 2% lidocaine with epi, 2 ml used Suture type/size: 6-0 Ethilon  Length: 2 cm # of Sutures: 3  Technique: simple interrupted Antibx ointment applied Tetanus ordered Site anesthetized, irrigated with NS, explored without evidence of foreign body, wound well approximated, site covered with dry, sterile dressing.  Patient tolerated procedure well without complications. Instructions for care discussed verbally  and patient provided with additional written instructions for homecare and f/u.  Labs Reviewed - No data to display No results found.   1. Laceration       MDM   Filed Vitals:   10/20/12 1930  BP: 131/76  Pulse: 79  Temp: 98.4 F (36.9 C)  TempSrc: Oral  Resp: 18  Weight: 104 lb (47.174 kg)  SpO2: 99%     [name] is a 67 y.o. female small full thickness laceration to lateral left eyebrow. Closed with 3 simple interrupted sutures. Has no crepitance, minimal swelling and bruising, extraocular movement is intact with no pain or  diplopia I do not feel maxillofacial CT is indicated at this time. Neuro exam is normal patient is not on any blood thinners  Medications  lidocaine-EPINEPHrine (XYLOCAINE W/EPI) 2 %-1:100000 (with pres) injection 20 mL (not administered)  tetanus & diphtheria toxoids (adult) (TENIVAC) injection 0.5 mL (0.5 mLs Intramuscular Given 10/20/12 2035)    The patient is hemodynamically stable, appropriate for, and amenable to, discharge at this time. Pt verbalized understanding and agrees with care plan. Outpatient follow-up and return precautions given.    I personally performed the services described in this documentation, which was scribed in my presence. The recorded information has been reviewed and is accurate.       Wynetta Emery, PA-C 10/20/12 2121

## 2012-10-20 NOTE — ED Notes (Signed)
Tripped and fell on loose patio block, hit face, glasses cut 1 inch lac above left eye, bleeding controlled. No LOC

## 2012-10-20 NOTE — ED Provider Notes (Signed)
Medical screening examination/treatment/procedure(s) were performed by non-physician practitioner and as supervising physician I was immediately available for consultation/collaboration.  Toy Baker, MD 10/20/12 [phone]

## 2012-10-22 ENCOUNTER — Ambulatory Visit: Payer: Medicare Other

## 2012-10-25 ENCOUNTER — Encounter (HOSPITAL_COMMUNITY): Payer: Self-pay | Admitting: Emergency Medicine

## 2012-10-25 ENCOUNTER — Emergency Department (INDEPENDENT_AMBULATORY_CARE_PROVIDER_SITE_OTHER)
Admission: EM | Admit: 2012-10-25 | Discharge: 2012-10-25 | Disposition: A | Discharge status: Home / Self Care | Payer: Medicare Other | Source: Home / Self Care

## 2012-10-25 DIAGNOSIS — Z4802 Encounter for removal of sutures: Secondary | ICD-10-CM

## 2012-10-25 NOTE — ED Provider Notes (Signed)
History     CSN: [id number]  Arrival date & time 10/25/12  1216   None     No chief complaint on file.   (Consider location/radiation/quality/duration/timing/severity/associated sxs/prior treatment) Patient is a 67 y.o. female presenting with suture removal. The history is provided by the patient.  Suture / Staple Removal This is a new problem. The current episode started more than 2 days ago. The problem has been resolved.    Past Medical History  Diagnosis Date  . Personal history of colonic polyps   . Unspecified gastritis and gastroduodenitis without mention of hemorrhage   . Hiatal hernia   . Acute sinusitis, unspecified   . Acute pharyngitis   . Dysfunction of eustachian tube   . Esophageal reflux   . Chronic airway obstruction, not elsewhere classified   . Unspecified asthma(493.90)   . Allergic rhinitis, cause unspecified   . Schatzki's ring     Past Surgical History  Procedure Laterality Date  . Laparoscopic hysterectomy    . Tonsillectomy    . Foot surgery    . Cyst removal hand    . Abdominal hysterectomy    . Partial mastectomy with needle localization Left 08/27/2012    Procedure: PARTIAL MASTECTOMY WITH NEEDLE LOCALIZATION;  Surgeon: Ernestene Mention, MD;  Location: [facility] OR;  Service: General;  Laterality: Left;    Family History  Problem Relation Age of Onset  . Diabetes Sister   . Allergies Sister   . Asthma Maternal Grandfather   . Colon cancer Neg Hx   . Alzheimer's disease Father   . Allergies Brother   . Allergies Brother     History  Substance Use Topics  . Smoking status: Never Smoker   . Smokeless tobacco: Never Used  . Alcohol Use: No    OB History   Grav Para Term Preterm Abortions TAB SAB Ect Mult Living                  Review of Systems  Constitutional: Negative.   HENT: Positive for facial swelling.   Skin: Negative.     Allergies  Shellfish allergy and Codeine  Home Medications   Current Outpatient Rx  Name   Route  Sig  Dispense  Refill  . calcium-vitamin D (OSCAL WITH D) 500-200 MG-UNIT per tablet   Oral   Take 1 tablet by mouth daily.         Marland Kitchen estradiol (VIVELLE-DOT) 0.025 MG/24HR   Transdermal   Place 1 patch onto the skin 2 (two) times a week.         . Multiple Vitamin (MULTIVITAMIN WITH MINERALS) TABS   Oral   Take 1 tablet by mouth daily.         Marland Kitchen omalizumab (XOLAIR) 150 MG injection   Subcutaneous   Inject 150 mg into the skin once a week.          Marland Kitchen OVER THE COUNTER MEDICATION   Oral   Take 4 capsules by mouth 2 (two) times daily. Juice plus [place], Veggie, Complete         . RABEprazole (ACIPHEX) 20 MG tablet   Oral   Take 20 mg by mouth daily before breakfast.           There were no vitals taken for this visit.  Physical Exam  Nursing note and vitals reviewed. Constitutional: She is oriented to person, place, and time. She appears well-developed and well-nourished.  HENT:  Head: Normocephalic.  Left eyebrow  lac healed, 3 stitches removed.  Neurological: She is alert and oriented to person, place, and time.  Skin: Skin is warm and dry.    ED Course  Procedures (including critical care time)  Labs Reviewed - No data to display No results found.   1. Encounter for removal of sutures       MDM          Linna Hoff, MD 10/25/12 1322

## 2012-10-25 NOTE — ED Notes (Signed)
Pt is here to have sutures removed... Seen at [facility] ED on 5/25 She is alert and oriented w/no signs of acute distress.

## 2012-10-29 ENCOUNTER — Ambulatory Visit (INDEPENDENT_AMBULATORY_CARE_PROVIDER_SITE_OTHER): Payer: Medicare Other

## 2012-10-29 DIAGNOSIS — J309 Allergic rhinitis, unspecified: Secondary | ICD-10-CM

## 2012-11-05 ENCOUNTER — Ambulatory Visit (INDEPENDENT_AMBULATORY_CARE_PROVIDER_SITE_OTHER): Payer: Medicare Other

## 2012-11-05 DIAGNOSIS — J309 Allergic rhinitis, unspecified: Secondary | ICD-10-CM

## 2012-11-12 ENCOUNTER — Ambulatory Visit: Payer: Medicare Other

## 2012-11-21 ENCOUNTER — Ambulatory Visit (INDEPENDENT_AMBULATORY_CARE_PROVIDER_SITE_OTHER): Payer: Medicare Other

## 2012-11-21 DIAGNOSIS — J309 Allergic rhinitis, unspecified: Secondary | ICD-10-CM

## 2012-12-03 ENCOUNTER — Ambulatory Visit (INDEPENDENT_AMBULATORY_CARE_PROVIDER_SITE_OTHER): Payer: Medicare Other

## 2012-12-03 DIAGNOSIS — J309 Allergic rhinitis, unspecified: Secondary | ICD-10-CM

## 2012-12-04 ENCOUNTER — Encounter: Payer: Self-pay | Admitting: *Deleted

## 2012-12-10 ENCOUNTER — Ambulatory Visit: Payer: Medicare Other

## 2012-12-11 ENCOUNTER — Ambulatory Visit (INDEPENDENT_AMBULATORY_CARE_PROVIDER_SITE_OTHER): Payer: Medicare Other | Admitting: Gastroenterology

## 2012-12-11 ENCOUNTER — Encounter: Payer: Self-pay | Admitting: Gastroenterology

## 2012-12-11 ENCOUNTER — Ambulatory Visit (INDEPENDENT_AMBULATORY_CARE_PROVIDER_SITE_OTHER): Payer: Medicare Other

## 2012-12-11 VITALS — BP 128/80 | HR 83 | Ht 62.0 in | Wt 95.5 lb

## 2012-12-11 DIAGNOSIS — K649 Unspecified hemorrhoids: Secondary | ICD-10-CM

## 2012-12-11 DIAGNOSIS — J309 Allergic rhinitis, unspecified: Secondary | ICD-10-CM

## 2012-12-11 DIAGNOSIS — K589 Irritable bowel syndrome without diarrhea: Secondary | ICD-10-CM

## 2012-12-11 MED ORDER — HYDROCORTISONE ACE-PRAMOXINE 1-1 % RE CREA: TOPICAL_CREAM | Freq: Every day | RECTAL | Status: DC

## 2012-12-11 NOTE — Progress Notes (Signed)
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  She's had a sore spot around her anus for the last 2 weeks without rectal bleeding.  Her pain is markedly decreased in intensity, and she has no other GI complaints at this time.  She specifically denies melena, hematochezia, upper GI or hepatobiliary complaints.  She does have chronic gas, bloating, and vague abdominal discomfort.  She's had several previous negative GI evaluations.  She's up-to-date on her endoscopic and colonoscopy exams.  We again reviewed her voiding sorbitol, fructose, and other nonabsorbable carbohydrates in her diet.  Current Medications, Allergies, Past Medical History, Past Surgical History, Family History and Social History were reviewed in [employer] record.  ROS: All systems were reviewed and are negative unless otherwise stated in the HPI.          Physical Exam: Blood pressure 120/80, pulse 83 and regular and weight 95 pounds with a BMI of 17.46.  Abdominal exam shows no organomegaly, masses or tenderness.  Inspection shows a left posterior partially thrombosed external hemorrhoid.  Rectal exam otherwise is unremarkable without masses or tenderness.  Stool is guaiac-negative.  There no fissures or fistulae noted.  Mental status is normal.    Assessment and Plan: Diarrhea predominant IBS.  I placed her on a FOD-MAP diet for chronic IBS diarrhea.  She is to do Sitz baths at bedtime with local Analpram cream for 2 weeks, we gave her information on hemorrhoids and their management, and I will see her on a when necessary basis as needed. No diagnosis found.

## 2012-12-11 NOTE — Patient Instructions (Addendum)
We have sent the following medications to your pharmacy for you to pick up at your convenience: Anal pram cream, please place rectally ten minutes after SITZ Baths(Information below on SITZ Baths)  Information on Hemorrhoids is below.   FODMAP given for your review. __________________________________________  Hemorrhoids Hemorrhoids are swollen veins around the rectum or anus. There are two types of hemorrhoids:   Internal hemorrhoids. These occur in the veins just inside the rectum. They may poke through to the outside and become irritated and painful.  External hemorrhoids. These occur in the veins outside the anus and can be felt as a painful swelling or hard lump near the anus. CAUSES  Pregnancy.   Obesity.   Constipation or diarrhea.   Straining to have a bowel movement.   Sitting for long periods on the toilet.  Heavy lifting or other activity that caused you to strain.  Anal intercourse. SYMPTOMS   Pain.   Anal itching or irritation.   Rectal bleeding.   Fecal leakage.   Anal swelling.   One or more lumps around the anus.  DIAGNOSIS  Your caregiver may be able to diagnose hemorrhoids by visual examination. Other examinations or tests that may be performed include:   Examination of the rectal area with a gloved hand (digital rectal exam).   Examination of anal canal using a small tube (scope).   A blood test if you have lost a significant amount of blood.  A test to look inside the colon (sigmoidoscopy or colonoscopy). TREATMENT Most hemorrhoids can be treated at home. However, if symptoms do not seem to be getting better or if you have a lot of rectal bleeding, your caregiver may perform a procedure to help make the hemorrhoids get smaller or remove them completely. Possible treatments include:   Placing a rubber band at the base of the hemorrhoid to cut off the circulation (rubber band ligation).   Injecting a chemical to shrink the  hemorrhoid (sclerotherapy).   Using a tool to burn the hemorrhoid (infrared light therapy).   Surgically removing the hemorrhoid (hemorrhoidectomy).   Stapling the hemorrhoid to block blood flow to the tissue (hemorrhoid stapling).  HOME CARE INSTRUCTIONS   Eat foods with fiber, such as whole grains, beans, nuts, fruits, and vegetables. Ask your doctor about taking products with added fiber in them (fibersupplements).  Increase fluid intake. Drink enough water and fluids to keep your urine clear or pale yellow.   Exercise regularly.   Go to the bathroom when you have the urge to have a bowel movement. Do not wait.   Avoid straining to have bowel movements.   Keep the anal area dry and clean. Use wet toilet paper or moist towelettes after a bowel movement.   Medicated creams and suppositories may be used or applied as directed.   Only take over-the-counter or prescription medicines as directed by your caregiver.   Take warm sitz baths for 15 20 minutes, 3 4 times a day to ease pain and discomfort.   Place ice packs on the hemorrhoids if they are tender and swollen. Using ice packs between sitz baths may be helpful.   Put ice in a plastic bag.   Place a towel between your skin and the bag.   Leave the ice on for 15 20 minutes, 3 4 times a day.   Do not use a donut-shaped pillow or sit on the toilet for long periods. This increases blood pooling and pain.  SEEK MEDICAL CARE IF:  You have increasing pain and swelling that is not controlled by treatment or medicine.  You have uncontrolled bleeding.  You have difficulty or you are unable to have a bowel movement.  You have pain or inflammation outside the area of the hemorrhoids. MAKE SURE YOU: 1. Understand these instructions. 2. Will watch your condition. 3. Will get help right away if you are not doing well or get worse. Document Released: 05/12/2000 Document Revised: 05/01/2012 Document Reviewed:  03/19/2012 ExitCare Patient Information 2014 ExitCare, [place].  ________________________________   Lanny Hurst A sitz bath is a warm water bath taken in the sitting position that covers only the hips and buttocks. It may be used for either healing or hygiene purposes. Sitz baths are also used to relieve pain, itching, or muscle spasms. The water may contain medicine. Moist heat will help you heal and relax.  HOME CARE INSTRUCTIONS  Take 3 to 4 sitz baths a day. 4. Fill the bathtub half full with warm water. 5. Sit in the water and open the drain a little. 6. Turn on the warm water to keep the tub half full. Keep the water running constantly. 7. Soak in the water for 15 to 20 minutes. 8. After the sitz bath, pat the affected area dry first. SEEK MEDICAL CARE IF:  You get worse instead of better. Stop the sitz baths if you get worse. MAKE SURE YOU:  Understand these instructions.  Will watch your condition.  Will get help right away if you are not doing well or get worse. Document Released: 02/05/2004 Document Revised: 02/07/2012 Document Reviewed: 08/12/2010 ExitCare Patient Information 2014 [place].  _____________________________________                                               We are excited to introduce MyChart, a new best-in-class service that provides you online access to important information in your electronic medical record. We want to make it easier for you to view your health information - all in one secure location - when and where you need it. We expect MyChart will enhance the quality of care and service we provide.  When you register for MyChart, you can:    View your test results.    Request appointments and receive appointment reminders via email.    Request medication renewals.    View your medical history, allergies, medications and immunizations.    Communicate with your physician's office through a password-protected site.    Conveniently print  information such as your medication lists.  To find out if MyChart is right for you, please talk to a member of our clinical staff today. We will gladly answer your questions about this free health and wellness tool.  If you are age 67 or older and want a member of your family to have access to your record, you must provide written consent by completing a proxy form available at our office. Please speak to our clinical staff about guidelines regarding accounts for patients younger than age 16.  As you activate your MyChart account and need any technical assistance, please call the MyChart technical support line at [phone]-CHART [phone]) or email your question to [email or web address]. If you email your question(s), please include your name, a return phone number and the best time to reach you.  If you have non-urgent health-related questions, you can send  a message to our office through MyChart at [email or web address]. If you have a medical emergency, call 911.  Thank you for using MyChart as your new health and wellness resource!   MyChart licensed from [employer],  1610-9604. Patents Pending.

## 2012-12-19 ENCOUNTER — Ambulatory Visit (INDEPENDENT_AMBULATORY_CARE_PROVIDER_SITE_OTHER): Payer: Medicare Other

## 2012-12-19 DIAGNOSIS — J309 Allergic rhinitis, unspecified: Secondary | ICD-10-CM

## 2012-12-26 ENCOUNTER — Ambulatory Visit (INDEPENDENT_AMBULATORY_CARE_PROVIDER_SITE_OTHER): Payer: Medicare Other

## 2012-12-26 DIAGNOSIS — J309 Allergic rhinitis, unspecified: Secondary | ICD-10-CM

## 2012-12-31 ENCOUNTER — Ambulatory Visit (INDEPENDENT_AMBULATORY_CARE_PROVIDER_SITE_OTHER): Payer: Medicare Other | Admitting: Internal Medicine

## 2012-12-31 ENCOUNTER — Encounter: Payer: Self-pay | Admitting: Internal Medicine

## 2012-12-31 VITALS — BP 114/76 | HR 81 | Ht 62.0 in | Wt 100.2 lb

## 2012-12-31 DIAGNOSIS — J309 Allergic rhinitis, unspecified: Secondary | ICD-10-CM

## 2012-12-31 DIAGNOSIS — J302 Other seasonal allergic rhinitis: Secondary | ICD-10-CM

## 2012-12-31 DIAGNOSIS — J019 Acute sinusitis, unspecified: Secondary | ICD-10-CM

## 2012-12-31 MED ORDER — AZITHROMYCIN 250 MG PO TABS: ORAL_TABLET | ORAL | Status: DC

## 2012-12-31 NOTE — Patient Instructions (Addendum)
Neb neo nasal  Depo 80  Script for Zpak antibiotic  We can continue allergy vaccine 1:10 GH  Please call as needed

## 2012-12-31 NOTE — Progress Notes (Signed)
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 still on vaccine and doing okay-no reactions. Woke during the night last night with left maxillary sinus pain and pressure, nasal stuffiness, no drainage or fever. Continues allergy vaccine 1:10 GH and has done well. CXR 08/20/12- IMPRESSION:  No active cardiopulmonary abnormalities.  Original Report Authenticated By: Signa Kell, M.D.  ROS-see HPI Constitutional:   No-   weight loss, night sweats, fevers, chills, fatigue, lassitude. HEENT:   No-  headaches, difficulty swallowing, tooth/dental problems, sore throat,       No-  sneezing, itching, ear ache, +nasal congestion, +post nasal drip,  CV:  No-   chest pain, orthopnea, PND, swelling in lower extremities, anasarca,  dizziness, palpitations Resp: No-   shortness of breath with exertion or at rest.              No-   productive cough,  No non-productive cough,  No- coughing up of blood.              No-   change in color of mucus.  No- wheezing.   Skin: No-   rash or lesions. GI:  No-   heartburn, indigestion, abdominal pain, nausea, vomiting,  GU:  MS:  No-   joint pain or swelling.   Neuro-     nothing unusual Psych:  No- change in mood or affect. No depression or anxiety.  No memory loss.  OBJ- Physical Exam General- Alert, Oriented, Affect-appropriate, Distress- none acute Skin- rash-none, lesions- none, excoriation- none Lymphadenopathy- none Head- atraumatic            Eyes- Gross vision intact, PERRLA, conjunctivae and secretions clear            Ears- Hearing, canals-normal            Nose- Clear, +Septal dev, no-mucus, polyps,  ? Small area of erosion/early perforation             Throat- Mallampati II , mucosa clear , drainage- none, tonsils- atrophic Neck- flexible , trachea midline, no stridor , thyroid nl, carotid no bruit Chest -  symmetrical excursion , unlabored           Heart/CV- RRR , no murmur , no gallop  , no rub, nl s1 s2                           - JVD- none , edema- none, stasis changes- none, varices- none           Lung- clear to P&A, wheeze- none, cough+dry , dullness-none, rub- none           Chest wall-  Abd-  Br/ Gen/ Rectal- Not done, not indicated Extrem- cyanosis- none, clubbing, none, atrophy- none, strength- nl Neuro- grossly intact to observation

## 2013-01-01 ENCOUNTER — Other Ambulatory Visit: Payer: Self-pay

## 2013-01-02 ENCOUNTER — Ambulatory Visit: Payer: Medicare Other

## 2013-01-03 ENCOUNTER — Ambulatory Visit (INDEPENDENT_AMBULATORY_CARE_PROVIDER_SITE_OTHER): Payer: Medicare Other

## 2013-01-03 DIAGNOSIS — J309 Allergic rhinitis, unspecified: Secondary | ICD-10-CM

## 2013-01-10 ENCOUNTER — Ambulatory Visit: Payer: Medicare Other

## 2013-01-13 NOTE — Assessment & Plan Note (Signed)
She has occasional episode of acute sinusitis but generally does pretty well. No recent antibiotics. Plan- nasal decongestant neb, depo 80, Zpak

## 2013-01-13 NOTE — Assessment & Plan Note (Signed)
She is satisfied to continue allergy vaccine. 

## 2013-01-17 ENCOUNTER — Ambulatory Visit (INDEPENDENT_AMBULATORY_CARE_PROVIDER_SITE_OTHER): Payer: Medicare Other

## 2013-01-17 DIAGNOSIS — J309 Allergic rhinitis, unspecified: Secondary | ICD-10-CM

## 2013-01-28 ENCOUNTER — Ambulatory Visit: Payer: Medicare Other

## 2013-01-30 ENCOUNTER — Ambulatory Visit (INDEPENDENT_AMBULATORY_CARE_PROVIDER_SITE_OTHER): Payer: Medicare Other

## 2013-01-30 DIAGNOSIS — J309 Allergic rhinitis, unspecified: Secondary | ICD-10-CM

## 2013-02-06 ENCOUNTER — Ambulatory Visit (INDEPENDENT_AMBULATORY_CARE_PROVIDER_SITE_OTHER): Payer: Medicare Other

## 2013-02-06 DIAGNOSIS — J309 Allergic rhinitis, unspecified: Secondary | ICD-10-CM

## 2013-02-11 ENCOUNTER — Ambulatory Visit (INDEPENDENT_AMBULATORY_CARE_PROVIDER_SITE_OTHER): Payer: Medicare Other

## 2013-02-11 DIAGNOSIS — Z23 Encounter for immunization: Secondary | ICD-10-CM

## 2013-02-13 ENCOUNTER — Ambulatory Visit (INDEPENDENT_AMBULATORY_CARE_PROVIDER_SITE_OTHER): Payer: Medicare Other

## 2013-02-13 DIAGNOSIS — J309 Allergic rhinitis, unspecified: Secondary | ICD-10-CM

## 2013-02-20 ENCOUNTER — Ambulatory Visit (INDEPENDENT_AMBULATORY_CARE_PROVIDER_SITE_OTHER): Payer: Medicare Other

## 2013-02-20 DIAGNOSIS — J309 Allergic rhinitis, unspecified: Secondary | ICD-10-CM

## 2013-02-27 ENCOUNTER — Ambulatory Visit (INDEPENDENT_AMBULATORY_CARE_PROVIDER_SITE_OTHER): Payer: Medicare Other

## 2013-02-27 DIAGNOSIS — J309 Allergic rhinitis, unspecified: Secondary | ICD-10-CM

## 2013-02-28 ENCOUNTER — Ambulatory Visit (INDEPENDENT_AMBULATORY_CARE_PROVIDER_SITE_OTHER): Payer: Medicare Other

## 2013-02-28 DIAGNOSIS — J309 Allergic rhinitis, unspecified: Secondary | ICD-10-CM

## 2013-03-06 ENCOUNTER — Ambulatory Visit (INDEPENDENT_AMBULATORY_CARE_PROVIDER_SITE_OTHER): Payer: Medicare Other

## 2013-03-06 DIAGNOSIS — J309 Allergic rhinitis, unspecified: Secondary | ICD-10-CM

## 2013-03-13 ENCOUNTER — Ambulatory Visit: Payer: Medicare Other

## 2013-03-18 ENCOUNTER — Ambulatory Visit (INDEPENDENT_AMBULATORY_CARE_PROVIDER_SITE_OTHER): Payer: Medicare Other

## 2013-03-18 DIAGNOSIS — J309 Allergic rhinitis, unspecified: Secondary | ICD-10-CM

## 2013-03-25 ENCOUNTER — Ambulatory Visit: Payer: Medicare Other

## 2013-03-27 ENCOUNTER — Ambulatory Visit (INDEPENDENT_AMBULATORY_CARE_PROVIDER_SITE_OTHER): Payer: Medicare Other

## 2013-03-27 DIAGNOSIS — J309 Allergic rhinitis, unspecified: Secondary | ICD-10-CM

## 2013-04-03 ENCOUNTER — Ambulatory Visit (INDEPENDENT_AMBULATORY_CARE_PROVIDER_SITE_OTHER): Payer: Medicare Other

## 2013-04-03 DIAGNOSIS — J309 Allergic rhinitis, unspecified: Secondary | ICD-10-CM

## 2013-04-10 ENCOUNTER — Ambulatory Visit (INDEPENDENT_AMBULATORY_CARE_PROVIDER_SITE_OTHER): Payer: Medicare Other

## 2013-04-10 DIAGNOSIS — J309 Allergic rhinitis, unspecified: Secondary | ICD-10-CM

## 2013-04-17 ENCOUNTER — Ambulatory Visit (INDEPENDENT_AMBULATORY_CARE_PROVIDER_SITE_OTHER): Payer: Medicare Other

## 2013-04-17 DIAGNOSIS — J309 Allergic rhinitis, unspecified: Secondary | ICD-10-CM

## 2013-04-29 ENCOUNTER — Ambulatory Visit (INDEPENDENT_AMBULATORY_CARE_PROVIDER_SITE_OTHER): Payer: Medicare Other

## 2013-04-29 DIAGNOSIS — J309 Allergic rhinitis, unspecified: Secondary | ICD-10-CM

## 2013-05-06 ENCOUNTER — Ambulatory Visit (INDEPENDENT_AMBULATORY_CARE_PROVIDER_SITE_OTHER): Payer: Medicare Other

## 2013-05-06 DIAGNOSIS — J309 Allergic rhinitis, unspecified: Secondary | ICD-10-CM

## 2013-05-08 ENCOUNTER — Encounter: Payer: Self-pay | Admitting: Internal Medicine

## 2013-05-13 ENCOUNTER — Ambulatory Visit: Payer: Medicare Other

## 2013-05-14 ENCOUNTER — Ambulatory Visit (INDEPENDENT_AMBULATORY_CARE_PROVIDER_SITE_OTHER): Payer: Medicare Other

## 2013-05-14 DIAGNOSIS — J309 Allergic rhinitis, unspecified: Secondary | ICD-10-CM

## 2013-05-27 ENCOUNTER — Ambulatory Visit: Payer: Medicare Other

## 2013-06-03 ENCOUNTER — Ambulatory Visit (INDEPENDENT_AMBULATORY_CARE_PROVIDER_SITE_OTHER): Payer: Medicare Other

## 2013-06-03 DIAGNOSIS — J309 Allergic rhinitis, unspecified: Secondary | ICD-10-CM

## 2013-06-10 ENCOUNTER — Ambulatory Visit (INDEPENDENT_AMBULATORY_CARE_PROVIDER_SITE_OTHER): Payer: Medicare Other

## 2013-06-10 DIAGNOSIS — J309 Allergic rhinitis, unspecified: Secondary | ICD-10-CM

## 2013-06-17 ENCOUNTER — Ambulatory Visit: Payer: Medicare Other

## 2013-06-19 ENCOUNTER — Ambulatory Visit (INDEPENDENT_AMBULATORY_CARE_PROVIDER_SITE_OTHER): Payer: Medicare Other

## 2013-06-19 DIAGNOSIS — J309 Allergic rhinitis, unspecified: Secondary | ICD-10-CM

## 2013-06-26 ENCOUNTER — Ambulatory Visit (INDEPENDENT_AMBULATORY_CARE_PROVIDER_SITE_OTHER): Payer: Medicare Other

## 2013-06-26 DIAGNOSIS — J309 Allergic rhinitis, unspecified: Secondary | ICD-10-CM

## 2013-07-03 ENCOUNTER — Ambulatory Visit (INDEPENDENT_AMBULATORY_CARE_PROVIDER_SITE_OTHER): Payer: Medicare Other

## 2013-07-03 DIAGNOSIS — J309 Allergic rhinitis, unspecified: Secondary | ICD-10-CM

## 2013-07-10 ENCOUNTER — Ambulatory Visit (INDEPENDENT_AMBULATORY_CARE_PROVIDER_SITE_OTHER): Payer: Medicare Other

## 2013-07-10 DIAGNOSIS — J309 Allergic rhinitis, unspecified: Secondary | ICD-10-CM

## 2013-07-17 ENCOUNTER — Ambulatory Visit: Payer: Medicare Other

## 2013-07-22 ENCOUNTER — Other Ambulatory Visit: Payer: Self-pay | Admitting: Gastroenterology

## 2013-07-24 ENCOUNTER — Ambulatory Visit: Payer: Medicare Other

## 2013-07-29 ENCOUNTER — Ambulatory Visit (INDEPENDENT_AMBULATORY_CARE_PROVIDER_SITE_OTHER): Payer: Medicare Other

## 2013-07-29 DIAGNOSIS — J309 Allergic rhinitis, unspecified: Secondary | ICD-10-CM

## 2013-07-30 ENCOUNTER — Other Ambulatory Visit: Payer: Self-pay | Admitting: Gastroenterology

## 2013-08-07 ENCOUNTER — Ambulatory Visit (INDEPENDENT_AMBULATORY_CARE_PROVIDER_SITE_OTHER): Payer: Medicare Other

## 2013-08-07 DIAGNOSIS — J309 Allergic rhinitis, unspecified: Secondary | ICD-10-CM

## 2013-08-14 ENCOUNTER — Ambulatory Visit (INDEPENDENT_AMBULATORY_CARE_PROVIDER_SITE_OTHER): Payer: Medicare Other

## 2013-08-14 DIAGNOSIS — J309 Allergic rhinitis, unspecified: Secondary | ICD-10-CM

## 2013-08-18 ENCOUNTER — Ambulatory Visit (INDEPENDENT_AMBULATORY_CARE_PROVIDER_SITE_OTHER): Payer: Medicare Other

## 2013-08-18 DIAGNOSIS — J309 Allergic rhinitis, unspecified: Secondary | ICD-10-CM

## 2013-08-21 ENCOUNTER — Ambulatory Visit (INDEPENDENT_AMBULATORY_CARE_PROVIDER_SITE_OTHER): Payer: Medicare Other

## 2013-08-21 DIAGNOSIS — J309 Allergic rhinitis, unspecified: Secondary | ICD-10-CM

## 2013-08-28 ENCOUNTER — Ambulatory Visit (INDEPENDENT_AMBULATORY_CARE_PROVIDER_SITE_OTHER): Payer: Medicare Other

## 2013-08-28 DIAGNOSIS — J309 Allergic rhinitis, unspecified: Secondary | ICD-10-CM

## 2013-09-11 ENCOUNTER — Ambulatory Visit (INDEPENDENT_AMBULATORY_CARE_PROVIDER_SITE_OTHER): Payer: Medicare Other

## 2013-09-11 DIAGNOSIS — J309 Allergic rhinitis, unspecified: Secondary | ICD-10-CM

## 2013-09-18 ENCOUNTER — Ambulatory Visit (INDEPENDENT_AMBULATORY_CARE_PROVIDER_SITE_OTHER): Payer: Medicare Other

## 2013-09-18 DIAGNOSIS — J309 Allergic rhinitis, unspecified: Secondary | ICD-10-CM

## 2013-09-25 ENCOUNTER — Ambulatory Visit (INDEPENDENT_AMBULATORY_CARE_PROVIDER_SITE_OTHER): Payer: Medicare Other

## 2013-09-25 DIAGNOSIS — J309 Allergic rhinitis, unspecified: Secondary | ICD-10-CM

## 2013-10-02 ENCOUNTER — Ambulatory Visit (INDEPENDENT_AMBULATORY_CARE_PROVIDER_SITE_OTHER): Payer: Medicare Other

## 2013-10-02 DIAGNOSIS — J309 Allergic rhinitis, unspecified: Secondary | ICD-10-CM

## 2013-10-09 ENCOUNTER — Ambulatory Visit (INDEPENDENT_AMBULATORY_CARE_PROVIDER_SITE_OTHER): Payer: Medicare Other

## 2013-10-09 DIAGNOSIS — J309 Allergic rhinitis, unspecified: Secondary | ICD-10-CM

## 2013-10-16 ENCOUNTER — Ambulatory Visit (INDEPENDENT_AMBULATORY_CARE_PROVIDER_SITE_OTHER): Payer: Medicare Other

## 2013-10-16 DIAGNOSIS — J309 Allergic rhinitis, unspecified: Secondary | ICD-10-CM

## 2013-10-23 ENCOUNTER — Ambulatory Visit (INDEPENDENT_AMBULATORY_CARE_PROVIDER_SITE_OTHER): Payer: Medicare Other

## 2013-10-23 DIAGNOSIS — J309 Allergic rhinitis, unspecified: Secondary | ICD-10-CM

## 2013-10-27 ENCOUNTER — Encounter: Payer: Self-pay | Admitting: Internal Medicine

## 2013-10-27 ENCOUNTER — Ambulatory Visit (INDEPENDENT_AMBULATORY_CARE_PROVIDER_SITE_OTHER): Payer: Medicare Other | Admitting: Internal Medicine

## 2013-10-27 VITALS — BP 150/82 | HR 80 | Ht 61.42 in | Wt 96.1 lb

## 2013-10-27 DIAGNOSIS — K589 Irritable bowel syndrome without diarrhea: Secondary | ICD-10-CM

## 2013-10-27 DIAGNOSIS — K219 Gastro-esophageal reflux disease without esophagitis: Secondary | ICD-10-CM

## 2013-10-27 MED ORDER — RABEPRAZOLE SODIUM 20 MG PO TBEC: 20.0000 mg | DELAYED_RELEASE_TABLET | Freq: Every day | ORAL | Status: DC

## 2013-10-27 NOTE — Patient Instructions (Signed)
We have sent the following medications to your pharmacy for you to pick up at your convenience:  Aciphex  Please follow up with Dr. Marina Goodell in one year

## 2013-10-27 NOTE — Progress Notes (Signed)
HISTORY OF PRESENT ILLNESS:  [name] is a 68 y.o. female with past medical history as listed below. She presents today for routine followup regarding management of her chronic GI conditions. Former patient of Dr. Sheryn Bison prior to his retirement. She was last seen in the office 12/11/2012. Since that time she has continued on AcipHex 20 mg daily. She reports good control of reflux symptoms. No dysphagia. She did undergo upper endoscopy with esophageal dilation 05/11/2010 for symptomatic peptic stricture. She continues with diarrhea predominant irritable bowel. She uses Imodium right ear, which helps. She did have complete colonoscopy May 11, 2010. This was normal. Random colon biopsies were taken and returned normal. She also had duodenal biopsies with her endoscopy which were normal. No new complaints or issues. She does have chronic bloating with gas. She requests medication refill  REVIEW OF SYSTEMS:  All non-GI ROS negative except for sinus and allergy trouble, sleeping problems  Past Medical History  Diagnosis Date  . Personal history of colonic polyps   . Unspecified gastritis and gastroduodenitis without mention of hemorrhage   . Hiatal hernia   . Acute sinusitis, unspecified   . Acute pharyngitis   . Dysfunction of eustachian tube   . Esophageal reflux   . Chronic airway obstruction, not elsewhere classified   . Unspecified asthma(493.90)   . Allergic rhinitis, cause unspecified   . Schatzki's ring   . Hemorrhoids   . IBS (irritable bowel syndrome)     Past Surgical History  Procedure Laterality Date  . Laparoscopic hysterectomy    . Tonsillectomy    . Foot surgery    . Cyst removal hand    . Abdominal hysterectomy    . Partial mastectomy with needle localization Left 08/27/2012    Procedure: PARTIAL MASTECTOMY WITH NEEDLE LOCALIZATION;  Surgeon: Ernestene Mention, MD;  Location: [facility] OR;  Service: General;  Laterality: Left;    Social History [name]   reports that she has never smoked. She has never used smokeless tobacco. She reports that she does not drink alcohol or use illicit drugs.  family history includes Allergies in her brother, brother, and sister; Alzheimer's disease in her father; Asthma in her maternal grandfather; Diabetes in her sister. There is no history of Colon cancer.  Allergies  Allergen Reactions  . Shellfish Allergy Anaphylaxis  . Codeine Nausea Only       PHYSICAL EXAMINATION: Vital signs: BP 150/82  Pulse 80  Ht 5' 1.42" (1.56 m)  Wt 96 lb 2 oz (43.602 kg)  BMI 17.92 kg/m2 General: Well-developed, well-nourished, no acute distress HEENT: Sclerae are anicteric, conjunctiva pink. Oral mucosa intact Lungs: Clear Heart: Regular Abdomen: soft, nontender, nondistended, no obvious ascites, no peritoneal signs, normal bowel sounds. No organomegaly. Extremities: No edema Psychiatric: alert and oriented x3. Cooperative   ASSESSMENT:  #1. GERD complicated by peptic stricture. Asymptomatic on PPI post dilation December 2011 #2. Diarrhea predominant irritable bowel syndrome. Ongoing #3. Complete colonoscopy December 2011 was normal  PLAN:  #1. Reflux precautions #2. Continue PPI. #3. Refill PPI as requested #4. Continue Imodium as needed #5. Surveillance colonoscopy around 2021 #6. GI followup in 1 year. Sooner if needed

## 2013-10-28 ENCOUNTER — Encounter: Payer: Self-pay | Admitting: Internal Medicine

## 2013-10-30 ENCOUNTER — Ambulatory Visit (INDEPENDENT_AMBULATORY_CARE_PROVIDER_SITE_OTHER): Payer: Medicare Other

## 2013-10-30 DIAGNOSIS — J309 Allergic rhinitis, unspecified: Secondary | ICD-10-CM

## 2013-11-06 ENCOUNTER — Ambulatory Visit (INDEPENDENT_AMBULATORY_CARE_PROVIDER_SITE_OTHER): Payer: Medicare Other

## 2013-11-06 DIAGNOSIS — J309 Allergic rhinitis, unspecified: Secondary | ICD-10-CM

## 2013-11-13 ENCOUNTER — Ambulatory Visit (INDEPENDENT_AMBULATORY_CARE_PROVIDER_SITE_OTHER): Payer: Medicare Other

## 2013-11-13 DIAGNOSIS — J309 Allergic rhinitis, unspecified: Secondary | ICD-10-CM

## 2013-11-25 ENCOUNTER — Ambulatory Visit (INDEPENDENT_AMBULATORY_CARE_PROVIDER_SITE_OTHER): Payer: Medicare Other

## 2013-11-25 DIAGNOSIS — J309 Allergic rhinitis, unspecified: Secondary | ICD-10-CM

## 2013-12-04 ENCOUNTER — Ambulatory Visit (INDEPENDENT_AMBULATORY_CARE_PROVIDER_SITE_OTHER): Payer: Medicare Other

## 2013-12-04 DIAGNOSIS — J309 Allergic rhinitis, unspecified: Secondary | ICD-10-CM

## 2013-12-18 ENCOUNTER — Ambulatory Visit (INDEPENDENT_AMBULATORY_CARE_PROVIDER_SITE_OTHER): Payer: Medicare Other

## 2013-12-18 DIAGNOSIS — J309 Allergic rhinitis, unspecified: Secondary | ICD-10-CM

## 2013-12-25 ENCOUNTER — Ambulatory Visit (INDEPENDENT_AMBULATORY_CARE_PROVIDER_SITE_OTHER): Payer: Medicare Other

## 2013-12-25 DIAGNOSIS — J309 Allergic rhinitis, unspecified: Secondary | ICD-10-CM

## 2014-01-07 ENCOUNTER — Encounter: Payer: Self-pay | Admitting: Gastroenterology

## 2014-01-16 ENCOUNTER — Ambulatory Visit (INDEPENDENT_AMBULATORY_CARE_PROVIDER_SITE_OTHER): Payer: Medicare Other

## 2014-01-16 DIAGNOSIS — J309 Allergic rhinitis, unspecified: Secondary | ICD-10-CM

## 2014-01-22 ENCOUNTER — Ambulatory Visit (INDEPENDENT_AMBULATORY_CARE_PROVIDER_SITE_OTHER): Payer: Medicare Other

## 2014-01-22 DIAGNOSIS — J309 Allergic rhinitis, unspecified: Secondary | ICD-10-CM

## 2014-01-28 ENCOUNTER — Encounter: Payer: Self-pay | Admitting: Internal Medicine

## 2014-02-05 ENCOUNTER — Ambulatory Visit (INDEPENDENT_AMBULATORY_CARE_PROVIDER_SITE_OTHER): Payer: Medicare Other

## 2014-02-05 DIAGNOSIS — J309 Allergic rhinitis, unspecified: Secondary | ICD-10-CM

## 2014-02-10 ENCOUNTER — Ambulatory Visit (INDEPENDENT_AMBULATORY_CARE_PROVIDER_SITE_OTHER): Payer: Medicare Other

## 2014-02-10 DIAGNOSIS — J309 Allergic rhinitis, unspecified: Secondary | ICD-10-CM

## 2014-02-12 ENCOUNTER — Ambulatory Visit (INDEPENDENT_AMBULATORY_CARE_PROVIDER_SITE_OTHER): Payer: Medicare Other

## 2014-02-12 DIAGNOSIS — J309 Allergic rhinitis, unspecified: Secondary | ICD-10-CM

## 2014-02-19 ENCOUNTER — Ambulatory Visit (INDEPENDENT_AMBULATORY_CARE_PROVIDER_SITE_OTHER): Payer: Medicare Other

## 2014-02-19 DIAGNOSIS — J309 Allergic rhinitis, unspecified: Secondary | ICD-10-CM

## 2014-02-20 ENCOUNTER — Encounter: Payer: Self-pay | Admitting: Internal Medicine

## 2014-02-20 ENCOUNTER — Ambulatory Visit (INDEPENDENT_AMBULATORY_CARE_PROVIDER_SITE_OTHER): Payer: Medicare Other | Admitting: Internal Medicine

## 2014-02-20 VITALS — BP 118/70 | HR 86 | Ht 62.0 in | Wt 98.6 lb

## 2014-02-20 DIAGNOSIS — J4520 Mild intermittent asthma, uncomplicated: Secondary | ICD-10-CM

## 2014-02-20 DIAGNOSIS — J3089 Other allergic rhinitis: Principal | ICD-10-CM

## 2014-02-20 DIAGNOSIS — J302 Other seasonal allergic rhinitis: Secondary | ICD-10-CM

## 2014-02-20 DIAGNOSIS — J309 Allergic rhinitis, unspecified: Secondary | ICD-10-CM

## 2014-02-20 DIAGNOSIS — J45909 Unspecified asthma, uncomplicated: Secondary | ICD-10-CM

## 2014-02-20 NOTE — Assessment & Plan Note (Signed)
Not needing bronchodilator now

## 2014-02-20 NOTE — Progress Notes (Signed)
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 still on vaccine and doing okay-no reactions. Woke during the night last night with left maxillary sinus pain and pressure, nasal stuffiness, no drainage or fever. Continues allergy vaccine 1:10 GH and has done well. CXR 08/20/12- IMPRESSION:  No active cardiopulmonary abnormalities.  Original Report Authenticated By: Signa Kell, M.D.  02/20/14- 25 yoF never smoker  followed for allergic rhinitis, asthma complicated by ulcerative colitis FOLLOWS FOR: Still on Allergy vaccine 1:10 GH; has cough and congestion around this time of year due to weather change She still feels allergy vaccine helps and is worth continuing. Mild nasal drainage. Used benadryl for yellow jacket sting.No need at all for inhaler- not wheezing. Does yoga and yardwork ok.   ROS-see HPI Constitutional:   No-   weight loss, night sweats, fevers, chills, fatigue, lassitude. HEENT:   No-  headaches, difficulty swallowing, tooth/dental problems, sore throat,       No-  sneezing, itching, ear ache, +nasal congestion, +post nasal drip,  CV:  No-   chest pain, orthopnea, PND, swelling in lower extremities, anasarca,  dizziness, palpitations Resp: No-   shortness of breath with exertion or at rest.              No-   productive cough,  No non-productive cough,  No- coughing up of blood.              No-   change in color of mucus.  No- wheezing.   Skin: No-   rash or lesions. GI:  No-   heartburn, indigestion, abdominal pain, nausea, vomiting,  GU:  MS:  No-   joint pain or swelling.   Neuro-     nothing unusual Psych:  No- change in mood or affect. No depression or anxiety.  No memory loss.  OBJ- Physical Exam General- Alert, Oriented, Affect-appropriate, Distress- none acute Skin- rash-none, lesions- none, excoriation-  none Lymphadenopathy- none Head- atraumatic            Eyes- Gross vision intact, PERRLA, conjunctivae and secretions clear            Ears- Hearing, canals-normal            Nose- Clear, +Septal dev, no-mucus, polyps,  erosions             Throat- Mallampati II , mucosa clear , drainage- none, tonsils- atrophic Neck- flexible , trachea midline, no stridor , thyroid nl, carotid no bruit Chest - symmetrical excursion , unlabored           Heart/CV- RRR , no murmur , no gallop  , no rub, nl s1 s2                           - JVD- none , edema- none, stasis changes- none, varices- none           Lung- clear to P&A, wheeze- none, cough-none , dullness-none, rub- none           Chest wall-  Abd-  Br/ Gen/ Rectal- Not done, not indicated Extrem- cyanosis- none, clubbing, none, atrophy- none, strength- nl Neuro- grossly intact to observation

## 2014-02-20 NOTE — Patient Instructions (Signed)
Flu vax  Ok to take an otc antihistamine if needed- benadryl, claritin or zyrtec are common choices  Please call if needed

## 2014-02-20 NOTE — Assessment & Plan Note (Signed)
Ok to continue allergy vaccine 1:10 GH Educated comparison antihistamines.

## 2014-02-26 ENCOUNTER — Ambulatory Visit (INDEPENDENT_AMBULATORY_CARE_PROVIDER_SITE_OTHER): Payer: Medicare Other

## 2014-02-26 ENCOUNTER — Telehealth: Payer: Self-pay | Admitting: Internal Medicine

## 2014-02-26 DIAGNOSIS — J309 Allergic rhinitis, unspecified: Secondary | ICD-10-CM

## 2014-02-26 NOTE — Telephone Encounter (Signed)
[name] wanted you to know she got her pnu. Shot at Dr.Mckenzie's([facility]  office last Dec. On the 14th.

## 2014-02-27 NOTE — Telephone Encounter (Signed)
This has been documented in pt's chart.  Nothing further needed.

## 2014-03-05 ENCOUNTER — Ambulatory Visit (INDEPENDENT_AMBULATORY_CARE_PROVIDER_SITE_OTHER): Payer: Medicare Other

## 2014-03-05 DIAGNOSIS — J309 Allergic rhinitis, unspecified: Secondary | ICD-10-CM

## 2014-03-12 ENCOUNTER — Ambulatory Visit (INDEPENDENT_AMBULATORY_CARE_PROVIDER_SITE_OTHER): Payer: Medicare Other

## 2014-03-12 DIAGNOSIS — J309 Allergic rhinitis, unspecified: Secondary | ICD-10-CM

## 2014-03-13 ENCOUNTER — Other Ambulatory Visit: Payer: Self-pay

## 2014-03-19 ENCOUNTER — Ambulatory Visit (INDEPENDENT_AMBULATORY_CARE_PROVIDER_SITE_OTHER): Payer: Medicare Other

## 2014-03-19 DIAGNOSIS — J309 Allergic rhinitis, unspecified: Secondary | ICD-10-CM

## 2014-03-26 ENCOUNTER — Ambulatory Visit (INDEPENDENT_AMBULATORY_CARE_PROVIDER_SITE_OTHER): Payer: Medicare Other

## 2014-03-26 DIAGNOSIS — J309 Allergic rhinitis, unspecified: Secondary | ICD-10-CM

## 2014-04-02 ENCOUNTER — Ambulatory Visit (INDEPENDENT_AMBULATORY_CARE_PROVIDER_SITE_OTHER): Payer: Medicare Other

## 2014-04-02 DIAGNOSIS — J309 Allergic rhinitis, unspecified: Secondary | ICD-10-CM

## 2014-04-03 ENCOUNTER — Encounter: Payer: Self-pay | Admitting: Internal Medicine

## 2014-04-09 ENCOUNTER — Ambulatory Visit (INDEPENDENT_AMBULATORY_CARE_PROVIDER_SITE_OTHER): Payer: Medicare Other

## 2014-04-09 DIAGNOSIS — J309 Allergic rhinitis, unspecified: Secondary | ICD-10-CM

## 2014-04-16 ENCOUNTER — Ambulatory Visit (INDEPENDENT_AMBULATORY_CARE_PROVIDER_SITE_OTHER): Payer: Medicare Other

## 2014-04-16 DIAGNOSIS — J309 Allergic rhinitis, unspecified: Secondary | ICD-10-CM

## 2014-04-30 ENCOUNTER — Ambulatory Visit (INDEPENDENT_AMBULATORY_CARE_PROVIDER_SITE_OTHER): Payer: Medicare Other

## 2014-04-30 DIAGNOSIS — J309 Allergic rhinitis, unspecified: Secondary | ICD-10-CM

## 2014-05-07 ENCOUNTER — Ambulatory Visit (INDEPENDENT_AMBULATORY_CARE_PROVIDER_SITE_OTHER): Payer: Medicare Other

## 2014-05-07 DIAGNOSIS — J309 Allergic rhinitis, unspecified: Secondary | ICD-10-CM

## 2014-05-13 ENCOUNTER — Ambulatory Visit (INDEPENDENT_AMBULATORY_CARE_PROVIDER_SITE_OTHER): Payer: Medicare Other

## 2014-05-13 DIAGNOSIS — J309 Allergic rhinitis, unspecified: Secondary | ICD-10-CM

## 2014-06-03 ENCOUNTER — Encounter: Payer: Self-pay | Admitting: Internal Medicine

## 2014-06-04 ENCOUNTER — Ambulatory Visit (INDEPENDENT_AMBULATORY_CARE_PROVIDER_SITE_OTHER): Payer: Medicare Other

## 2014-06-04 DIAGNOSIS — J309 Allergic rhinitis, unspecified: Secondary | ICD-10-CM

## 2014-06-11 ENCOUNTER — Ambulatory Visit (INDEPENDENT_AMBULATORY_CARE_PROVIDER_SITE_OTHER): Payer: Medicare Other

## 2014-06-11 DIAGNOSIS — J309 Allergic rhinitis, unspecified: Secondary | ICD-10-CM

## 2014-06-18 ENCOUNTER — Ambulatory Visit (INDEPENDENT_AMBULATORY_CARE_PROVIDER_SITE_OTHER): Payer: Medicare Other

## 2014-06-18 DIAGNOSIS — J309 Allergic rhinitis, unspecified: Secondary | ICD-10-CM

## 2014-06-30 ENCOUNTER — Ambulatory Visit (INDEPENDENT_AMBULATORY_CARE_PROVIDER_SITE_OTHER): Payer: Medicare Other

## 2014-06-30 DIAGNOSIS — J309 Allergic rhinitis, unspecified: Secondary | ICD-10-CM

## 2014-07-08 ENCOUNTER — Ambulatory Visit (INDEPENDENT_AMBULATORY_CARE_PROVIDER_SITE_OTHER): Payer: Medicare Other

## 2014-07-08 DIAGNOSIS — J309 Allergic rhinitis, unspecified: Secondary | ICD-10-CM

## 2014-07-09 ENCOUNTER — Ambulatory Visit (INDEPENDENT_AMBULATORY_CARE_PROVIDER_SITE_OTHER): Payer: Medicare Other

## 2014-07-09 DIAGNOSIS — J309 Allergic rhinitis, unspecified: Secondary | ICD-10-CM

## 2014-07-16 ENCOUNTER — Ambulatory Visit (INDEPENDENT_AMBULATORY_CARE_PROVIDER_SITE_OTHER): Payer: Medicare Other

## 2014-07-16 DIAGNOSIS — J309 Allergic rhinitis, unspecified: Secondary | ICD-10-CM

## 2014-07-21 ENCOUNTER — Telehealth: Payer: Self-pay | Admitting: Internal Medicine

## 2014-07-21 NOTE — Telephone Encounter (Signed)
Left a message for patient to call back. 

## 2014-07-23 ENCOUNTER — Ambulatory Visit (INDEPENDENT_AMBULATORY_CARE_PROVIDER_SITE_OTHER): Payer: Medicare Other

## 2014-07-23 DIAGNOSIS — J309 Allergic rhinitis, unspecified: Secondary | ICD-10-CM

## 2014-07-23 NOTE — Telephone Encounter (Signed)
Pt states she is having problems with heartburn and thinks she may have an ulcer. Pt would like to be seen. Pt scheduled to see Mike Gipmy Esterwood PA Wednesday at 1:30pm. Pt aware of appt.

## 2014-07-29 ENCOUNTER — Other Ambulatory Visit (INDEPENDENT_AMBULATORY_CARE_PROVIDER_SITE_OTHER): Payer: Medicare Other

## 2014-07-29 ENCOUNTER — Encounter: Payer: Self-pay | Admitting: Physician Assistant

## 2014-07-29 ENCOUNTER — Ambulatory Visit (INDEPENDENT_AMBULATORY_CARE_PROVIDER_SITE_OTHER): Payer: Medicare Other | Admitting: Physician Assistant

## 2014-07-29 VITALS — BP 136/82 | HR 76 | Ht 62.0 in | Wt 100.8 lb

## 2014-07-29 DIAGNOSIS — K219 Gastro-esophageal reflux disease without esophagitis: Secondary | ICD-10-CM

## 2014-07-29 DIAGNOSIS — R1013 Epigastric pain: Secondary | ICD-10-CM

## 2014-07-29 LAB — COMPREHENSIVE METABOLIC PANEL
ALT: 16 U/L (ref 0–35)
AST: 20 U/L (ref 0–37)
Albumin: 4.3 g/dL (ref 3.5–5.2)
Alkaline Phosphatase: 50 U/L (ref 39–117)
BUN: 20 mg/dL (ref 6–23)
CALCIUM: 9.3 mg/dL (ref 8.4–10.5)
CO2: 31 meq/L (ref 19–32)
Chloride: 103 mEq/L (ref 96–112)
Creatinine, Ser: 0.77 mg/dL (ref 0.40–1.20)
GFR: 79.1 mL/min (ref >60.00)
Glucose, Bld: 90 mg/dL (ref 70–99)
Potassium: 4.2 mEq/L (ref 3.5–5.1)
SODIUM: 138 meq/L (ref 135–145)
TOTAL PROTEIN: 7.5 g/dL (ref 6.0–8.3)
Total Bilirubin: 0.4 mg/dL (ref 0.2–1.2)

## 2014-07-29 LAB — CBC WITH DIFFERENTIAL/PLATELET
Basophils Absolute: 0 10*3/uL (ref 0.0–0.1)
Basophils Relative: 1.1 % (ref 0.0–3.0)
Eosinophils Absolute: 0.1 10*3/uL (ref 0.0–0.7)
Eosinophils Relative: 2.6 % (ref 0.0–5.0)
HEMATOCRIT: 34.5 % — AB (ref 36.0–46.0)
Hemoglobin: 11.9 g/dL — ABNORMAL LOW (ref 12.0–15.0)
LYMPHS ABS: 1.6 10*3/uL (ref 0.7–4.0)
LYMPHS PCT: 37.4 % (ref 12.0–46.0)
MCHC: 34.5 g/dL (ref 30.0–36.0)
MCV: 87.4 fl (ref 78.0–100.0)
Monocytes Absolute: 0.3 10*3/uL (ref 0.1–1.0)
Monocytes Relative: 7.8 % (ref 3.0–12.0)
Neutro Abs: 2.2 10*3/uL (ref 1.4–7.7)
Neutrophils Relative %: 51.1 % (ref 43.0–77.0)
Platelets: 241 10*3/uL (ref 150.0–400.0)
RBC: 3.95 Mil/uL (ref 3.87–5.11)
RDW: 13.8 % (ref 11.5–15.5)
WBC: 4.3 10*3/uL (ref 4.0–10.5)

## 2014-07-29 LAB — LIPASE: Lipase: 18 U/L (ref 11.0–59.0)

## 2014-07-29 NOTE — Progress Notes (Signed)
Patient ID: [name], female   DOB: 01/12/1946, 69 y.o.   MRN: [id number]   Subjective:    Patient ID: [name], female    DOB: 08/09/1945, 69 y.o.   MRN: [id number]  HPI [name] is a pleasant 69 year old white female known to Dr. Marina Ball. She has history of IBS, and chronic GERD as well as COPD. She had undergone an EGD and colonoscopy in 2011 for evaluation of her deficiency anemia. She was found to have a normal colon and on EGD had a Schatzki's ring which was dilated to 17 mm. She has been on chronic AcipHex 20 mg by mouth daily for many years which she says generally controls her reflux symptoms. She comes in today because of onset of epigastric pain about 6 days ago. She describes this as a fairly excruciating a burning type pain in her epigastrium which woke her up in the middle of the night. She says she took Pepto-Bismol and TUMs which did seem to help. She said she had symptoms for 2-3 nights ain a row.Some mild discomfort during the day but definitely worse at night. Over the past couple of days she has not had any significant nighttime symptoms but says she remains sore and uncomfortable in her upper abdomen "like the aftermath". She has not had any associated nausea or vomiting no fever or chills. No diarrhea or changes in bowel habits her stools been a bit dark with Pepto-Bismol but other than that no melena or hematochezia. She had not been on any new or different medications or supplements no NSAIDs no regular aspirin use no EtOH. She has not had any prior abdominal surgery. Patient says she was worried about having an ulcer.  Review of Systems Pertinent positive and negative review of systems were noted in the above HPI section.  All other review of systems was otherwise negative.  Outpatient Encounter Prescriptions as of 07/29/2014  Medication Sig  . CALCIUM-VITAMIN D PO Take by mouth. 500/1000 1 tablet by mouth daily  . estradiol (VIVELLE-DOT) 0.025 MG/24HR Place 1 patch onto  the skin 2 (two) times a week.  . Multiple Vitamin (MULTIVITAMIN WITH MINERALS) TABS Take 1 tablet by mouth daily.  . RABEprazole (ACIPHEX) 20 MG tablet Take 1 tablet (20 mg total) by mouth daily.   Allergies  Allergen Reactions  . Shellfish Allergy Anaphylaxis  . Codeine Nausea Only   Patient Active Problem List   Diagnosis Date Noted  . 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 ANEMIA, IRON DEFICIENCY 04/20/2010  . IRRITABLE BOWEL SYNDROME 03/20/2008  . DIARRHEA 03/20/2008  . ABDOMINAL PAIN, EPIGASTRIC 03/20/2008  . GASTRITIS 03/16/2008  . HIATAL HERNIA 03/16/2008  . COLONIC POLYPS, HX OF 03/16/2008  . SINUSITIS, ACUTE 12/26/2007  . PHARYNGITIS 12/19/2007  . DYSFUNCTION, EUSTACHIAN TUBE 03/12/2007  . Seasonal and perennial allergic rhinitis 03/12/2007  . Asthma, mild intermittent 03/12/2007  . COPD 03/12/2007  . GERD 03/12/2007   History   Social History  . Marital Status: Widowed    Spouse Name: N/A  . Number of Children: N/A  . Years of Education: N/A   Occupational History  . Retired     Designer, industrial/productmeeting secretary for [employer]   Social History Main Topics  . Smoking status: Never Smoker   . Smokeless tobacco: Never Used  . Alcohol Use: No  . Drug Use: No  . Sexual Activity: Not on file   Other Topics Concern  . Not on file  Social History Narrative    Ms. Rathod's family history includes Allergies in her brother, brother, and sister; Alzheimer's disease in her father; Asthma in her maternal grandfather; Diabetes in her sister. There is no history of Colon cancer.      Objective:    Filed Vitals:   07/29/14 1329  BP: 136/82  Pulse: 76    Physical Exam  well-developed older white female in no acute distress, pleasant blood pressure 132/82 pulse 76 height 5 foot 2 weight 100. HEENT;nontraumatic normocephalic EOMI PERRLA sclera anicteric, Supple; no JVD, Cardiovascular; regular rate and rhythm  with S1-S2 no murmur or gallop, Pulmonary; clear bilaterally, Abdomen ;soft bowel sounds are present she is mildly tender in the epigastrium and across the upper abdomen no guarding or rebound no palpable mass or hepatosplenomegaly bowel sounds are present, Rectal ;exam not done, Extremities; no clubbing cyanosis or edema skin warm and dry, Psych; mood and affect appropriate       Assessment & Plan:   #1 69 yo female with hx of chronic GERD with onset of burning,sharp upper abdominal pain one week ago, onset at night waking from sleep for a few nights and now just sore. Doubt PUD ,but cannot rule out,consider gastritis, gallbladder disease, pancreatitis  Plan; CBC, CMET, Lipase Schedule for upper abdominal US Continue Aciphex 20 mg daily in am Bland diet Further plans pending above-if sxs persist and above unrevealing can consider EGD   [name] S Holly Kuchera PA-C 07/29/2014

## 2014-07-29 NOTE — Progress Notes (Signed)
Agree with initial assessment and plans 

## 2014-07-29 NOTE — Patient Instructions (Signed)
Please go to the basement level to have your labs drawn.  Continue the Aciphex 20 mg daily.  You have been scheduled for an abdominal ultrasound at [facility] (1st floor of hospital) on Monday 08-03-2014 at 9:30 am . Please arrive at 9:15 am prior to your appointment for registration. Make certain not to have anything to eat or drink 6 hours prior to your appointment. Should you need to reschedule your appointment, please contact radiology at [phone]. This test typically takes about 30 minutes to perform.  [facility] [address] Go to the [facility] Entrance.  You can go in and get directions to Radiology.

## 2014-07-30 ENCOUNTER — Ambulatory Visit (INDEPENDENT_AMBULATORY_CARE_PROVIDER_SITE_OTHER): Payer: Medicare Other

## 2014-07-30 DIAGNOSIS — J309 Allergic rhinitis, unspecified: Secondary | ICD-10-CM

## 2014-08-03 ENCOUNTER — Other Ambulatory Visit: Payer: Self-pay

## 2014-08-03 ENCOUNTER — Ambulatory Visit (HOSPITAL_COMMUNITY)
Admission: RE | Admit: 2014-08-03 | Discharge: 2014-08-03 | Disposition: A | Discharge status: Home / Self Care | Payer: Medicare Other | Source: Ambulatory Visit | Attending: Physician Assistant | Admitting: Physician Assistant

## 2014-08-03 DIAGNOSIS — N289 Disorder of kidney and ureter, unspecified: Secondary | ICD-10-CM | POA: Insufficient documentation

## 2014-08-03 DIAGNOSIS — R1013 Epigastric pain: Secondary | ICD-10-CM | POA: Diagnosis present

## 2014-08-03 DIAGNOSIS — K219 Gastro-esophageal reflux disease without esophagitis: Secondary | ICD-10-CM | POA: Diagnosis not present

## 2014-08-06 ENCOUNTER — Ambulatory Visit (INDEPENDENT_AMBULATORY_CARE_PROVIDER_SITE_OTHER): Payer: Medicare Other

## 2014-08-06 DIAGNOSIS — J309 Allergic rhinitis, unspecified: Secondary | ICD-10-CM

## 2014-08-13 ENCOUNTER — Ambulatory Visit (INDEPENDENT_AMBULATORY_CARE_PROVIDER_SITE_OTHER): Payer: Medicare Other

## 2014-08-13 DIAGNOSIS — J309 Allergic rhinitis, unspecified: Secondary | ICD-10-CM

## 2014-08-14 ENCOUNTER — Ambulatory Visit (HOSPITAL_COMMUNITY)
Admission: RE | Admit: 2014-08-14 | Discharge: 2014-08-14 | Disposition: A | Discharge status: Home / Self Care | Payer: Medicare Other | Source: Ambulatory Visit | Attending: Physician Assistant | Admitting: Physician Assistant

## 2014-08-14 DIAGNOSIS — N289 Disorder of kidney and ureter, unspecified: Secondary | ICD-10-CM | POA: Diagnosis not present

## 2014-08-14 MED ORDER — GADOBENATE DIMEGLUMINE 529 MG/ML IV SOLN
10.0000 mL | Freq: Once | INTRAVENOUS | Status: AC | PRN
  Administered 2014-08-14: 10 mL via INTRAVENOUS

## 2014-08-20 ENCOUNTER — Ambulatory Visit (INDEPENDENT_AMBULATORY_CARE_PROVIDER_SITE_OTHER): Payer: Medicare Other

## 2014-08-20 DIAGNOSIS — J309 Allergic rhinitis, unspecified: Secondary | ICD-10-CM | POA: Diagnosis not present

## 2014-08-31 ENCOUNTER — Telehealth: Payer: Self-pay | Admitting: Physician Assistant

## 2014-08-31 NOTE — Telephone Encounter (Signed)
Patient states she is not better with her abdominal pain. She would like to do the EGD. Please advise.

## 2014-09-02 ENCOUNTER — Ambulatory Visit (INDEPENDENT_AMBULATORY_CARE_PROVIDER_SITE_OTHER): Payer: Medicare Other

## 2014-09-02 DIAGNOSIS — J309 Allergic rhinitis, unspecified: Secondary | ICD-10-CM

## 2014-09-03 ENCOUNTER — Ambulatory Visit: Payer: Medicare Other

## 2014-09-03 NOTE — Telephone Encounter (Signed)
Just to clarify, she called back and said she actually not better and want s go forward with an EGD. I will set her up in the [facility] with Dr Marina GoodellPerry if that is the plan. Please advise.

## 2014-09-03 NOTE — Telephone Encounter (Signed)
Yes ,that should be the plan.. Also would have her increase Aciphex  To 20 mg BID until then

## 2014-09-04 ENCOUNTER — Other Ambulatory Visit: Payer: Self-pay

## 2014-09-04 DIAGNOSIS — R1030 Lower abdominal pain, unspecified: Secondary | ICD-10-CM

## 2014-09-04 DIAGNOSIS — K219 Gastro-esophageal reflux disease without esophagitis: Secondary | ICD-10-CM

## 2014-09-04 NOTE — Telephone Encounter (Signed)
Patient contacted. She is agreeable to this plan of care. Pre-visit and procedure dates arranged to her convince.

## 2014-09-09 ENCOUNTER — Encounter: Payer: Medicare Other | Admitting: Internal Medicine

## 2014-09-10 ENCOUNTER — Ambulatory Visit (INDEPENDENT_AMBULATORY_CARE_PROVIDER_SITE_OTHER): Payer: Medicare Other

## 2014-09-10 DIAGNOSIS — J309 Allergic rhinitis, unspecified: Secondary | ICD-10-CM

## 2014-09-14 ENCOUNTER — Ambulatory Visit (AMBULATORY_SURGERY_CENTER): Payer: Self-pay | Admitting: *Deleted

## 2014-09-14 VITALS — Ht 62.0 in | Wt 100.0 lb

## 2014-09-14 DIAGNOSIS — R1013 Epigastric pain: Secondary | ICD-10-CM

## 2014-09-14 NOTE — Progress Notes (Signed)
No egg or soy allergy. No anesthesia problems.  No home O2.  No diet meds.  

## 2014-09-16 ENCOUNTER — Encounter: Payer: Medicare Other | Admitting: Internal Medicine

## 2014-09-16 ENCOUNTER — Ambulatory Visit: Payer: Medicare Other

## 2014-09-17 ENCOUNTER — Ambulatory Visit (INDEPENDENT_AMBULATORY_CARE_PROVIDER_SITE_OTHER): Payer: Medicare Other

## 2014-09-17 DIAGNOSIS — J309 Allergic rhinitis, unspecified: Secondary | ICD-10-CM

## 2014-09-21 ENCOUNTER — Encounter: Payer: Self-pay | Admitting: Internal Medicine

## 2014-09-24 ENCOUNTER — Ambulatory Visit (INDEPENDENT_AMBULATORY_CARE_PROVIDER_SITE_OTHER): Payer: Medicare Other

## 2014-09-24 DIAGNOSIS — J309 Allergic rhinitis, unspecified: Secondary | ICD-10-CM | POA: Diagnosis not present

## 2014-09-29 ENCOUNTER — Encounter: Payer: Self-pay | Admitting: Internal Medicine

## 2014-09-29 ENCOUNTER — Ambulatory Visit (AMBULATORY_SURGERY_CENTER): Payer: Medicare Other | Admitting: Internal Medicine

## 2014-09-29 VITALS — BP 142/80 | HR 77 | Temp 97.5°F | Resp 15 | Ht 62.0 in | Wt 100.0 lb

## 2014-09-29 DIAGNOSIS — R1013 Epigastric pain: Secondary | ICD-10-CM | POA: Diagnosis present

## 2014-09-29 DIAGNOSIS — K2100 Gastro-esophageal reflux disease with esophagitis, without bleeding: Secondary | ICD-10-CM

## 2014-09-29 MED ORDER — RANITIDINE HCL 150 MG PO TABS: 150.0000 mg | ORAL_TABLET | Freq: Two times a day (BID) | ORAL | Status: DC

## 2014-09-29 MED ORDER — PANTOPRAZOLE SODIUM 40 MG PO TBEC: 40.0000 mg | DELAYED_RELEASE_TABLET | Freq: Every day | ORAL | Status: DC

## 2014-09-29 MED ORDER — SODIUM CHLORIDE 0.9 % IV SOLN: 500.0000 mL | INTRAVENOUS | Status: DC

## 2014-09-29 NOTE — Progress Notes (Signed)
Report to PACU, RN, vss, BBS= Clear.  

## 2014-09-29 NOTE — Patient Instructions (Signed)
YOU HAD AN ENDOSCOPIC PROCEDURE TODAY AT THE [facility]:   Refer to the procedure report that was given to you for any specific questions about what was found during the examination.  If the procedure report does not answer your questions, please call your gastroenterologist to clarify.  If you requested that your care partner not be given the details of your procedure findings, then the procedure report has been included in a sealed envelope for you to review at your convenience later.  YOU SHOULD EXPECT: Some feelings of bloating in the abdomen. Passage of more gas than usual.  Walking can help get rid of the air that was put into your GI tract during the procedure and reduce the bloating. If you had a lower endoscopy (such as a colonoscopy or flexible sigmoidoscopy) you may notice spotting of blood in your stool or on the toilet paper. If you underwent a bowel prep for your procedure, you may not have a normal bowel movement for a few days.  Please Note:  You might notice some irritation and congestion in your nose or some drainage.  This is from the oxygen used during your procedure.  There is no need for concern and it should clear up in a day or so.  SYMPTOMS TO REPORT IMMEDIATELY:      Following upper endoscopy (EGD)  Vomiting of blood or coffee ground material  New chest pain or pain under the shoulder blades  Painful or persistently difficult swallowing  New shortness of breath  Fever of 100F or higher  Black, tarry-looking stools  For urgent or emergent issues, a gastroenterologist can be reached at any hour by calling [phone].   DIET: Your first meal following the procedure should be a small meal and then it is ok to progress to your normal diet. Heavy or fried foods are harder to digest and may make you feel nauseous or bloated.  Likewise, meals heavy in dairy and vegetables can increase bloating.  Drink plenty of fluids but you should avoid alcoholic beverages  for 24 hours.  ACTIVITY:  You should plan to take it easy for the rest of today and you should NOT DRIVE or use heavy machinery until tomorrow (because of the sedation medicines used during the test).    FOLLOW UP: Our staff will call the number listed on your records the next business day following your procedure to check on you and address any questions or concerns that you may have regarding the information given to you following your procedure. If we do not reach you, we will leave a message.  However, if you are feeling well and you are not experiencing any problems, there is no need to return our call.  We will assume that you have returned to your regular daily activities without incident.  If any biopsies were taken you will be contacted by phone or by letter within the next 1-3 weeks.  Please call us at [phone] if you have not heard about the biopsies in 3 weeks.    SIGNATURES/CONFIDENTIALITY: You and/or your care partner have signed paperwork which will be entered into your electronic medical record.  These signatures attest to the fact that that the information above on your After Visit Summary has been reviewed and is understood.  Full responsibility of the confidentiality of this discharge information lies with you and/or your care-partner.   Stop Aciphex, Resume remainder of medications. Call to schedule follow up appointment for 6 weeks.

## 2014-09-29 NOTE — Op Note (Signed)
[facility] [address] [place], [id number]   ENDOSCOPY PROCEDURE REPORT  PATIENT: [name], [name]  MR#: [id number] BIRTHDATE: Mar 08, 1946 , 68  yrs. old GENDER: female ENDOSCOPIST: Roxy CedarJohn N Aimie Wagman Jr, MD REFERRED BY:  .  Self / Office PROCEDURE DATE:  09/29/2014 PROCEDURE:  EGD, diagnostic ASA CLASS:     Class II INDICATIONS:  epigastric pain. MEDICATIONS: Monitored anesthesia care and Propofol 100 mg IV TOPICAL ANESTHETIC: none  DESCRIPTION OF PROCEDURE: After the risks benefits and alternatives of the procedure were thoroughly explained, informed consent was obtained.  The LB [id number] endoscope was introduced through the mouth and advanced to the second portion of the duodenum , Without limitations.  The instrument was slowly withdrawn as the mucosa was fully examined.    Esophagus revealed a large caliber distal ring with mild mucosal edema.  Stomach was normal.  Duodenum was normal.  Retroflexed views revealed a hiatal hernia.     The scope was then withdrawn from the patient and the procedure completed.  COMPLICATIONS: There were no immediate complications.  ENDOSCOPIC IMPRESSION: 1. Esophagus revealed a large caliber distal ring with mild mucosal edema.  Stomach was normal.  Duodenum was normal  RECOMMENDATIONS: 1.  Anti-reflux regimen to be followed 2.  Stop AcipHex 3. Prescribe pantoprazole 40 mg daily; #30; 11 refills. Take 1 each morning 4. Prescribe ranitidine 150 mg; #30; 11 refills.  take 1 at bedtime 5. Make a follow-up office appointment with Dr. Marina GoodellPerry in about 6 weeks  REPEAT EXAM:  eSigned:  Roxy CedarJohn N Chelli Yerkes Jr, MD 09/29/2014 9:44 AM    EX:BMWUXCC:Brian Thea SilversmithMacKenzie, MD and The Patient

## 2014-09-30 ENCOUNTER — Telehealth: Payer: Self-pay | Admitting: *Deleted

## 2014-09-30 NOTE — Telephone Encounter (Signed)
  Follow up Call-  Call back number 09/29/2014  Post procedure Call Back phone  # [phone]  Permission to leave phone message Yes     Patient questions:  Do you have a fever, pain , or abdominal swelling? No. Pain Score  0 *  Have you tolerated food without any problems? No.  Have you been able to return to your normal activities? Yes.    Do you have any questions about your discharge instructions: Diet   No. Medications  No. Follow up visit  No.  Do you have questions or concerns about your Care? No.  Actions: * If pain score is 4 or above: No action needed, pain <4.

## 2014-10-01 ENCOUNTER — Ambulatory Visit (INDEPENDENT_AMBULATORY_CARE_PROVIDER_SITE_OTHER): Payer: Medicare Other

## 2014-10-01 DIAGNOSIS — J309 Allergic rhinitis, unspecified: Secondary | ICD-10-CM

## 2014-10-02 ENCOUNTER — Telehealth: Payer: Self-pay

## 2014-10-02 DIAGNOSIS — K2100 Gastro-esophageal reflux disease with esophagitis, without bleeding: Secondary | ICD-10-CM

## 2014-10-02 DIAGNOSIS — R1013 Epigastric pain: Secondary | ICD-10-CM

## 2014-10-02 MED ORDER — RANITIDINE HCL 150 MG PO TABS: 150.0000 mg | ORAL_TABLET | Freq: Every day | ORAL | Status: DC

## 2014-10-02 NOTE — Telephone Encounter (Signed)
Sent corrected rx for Zantac - correct dose is 150mg  once a day (not twice) per the procedure note dated 09/29/2014

## 2014-10-08 ENCOUNTER — Ambulatory Visit (INDEPENDENT_AMBULATORY_CARE_PROVIDER_SITE_OTHER): Payer: Medicare Other

## 2014-10-08 DIAGNOSIS — J309 Allergic rhinitis, unspecified: Secondary | ICD-10-CM

## 2014-10-15 ENCOUNTER — Ambulatory Visit (INDEPENDENT_AMBULATORY_CARE_PROVIDER_SITE_OTHER): Payer: Medicare Other

## 2014-10-15 DIAGNOSIS — J309 Allergic rhinitis, unspecified: Secondary | ICD-10-CM | POA: Diagnosis not present

## 2014-10-22 ENCOUNTER — Ambulatory Visit (INDEPENDENT_AMBULATORY_CARE_PROVIDER_SITE_OTHER): Payer: Medicare Other

## 2014-10-22 DIAGNOSIS — J309 Allergic rhinitis, unspecified: Secondary | ICD-10-CM | POA: Diagnosis not present

## 2014-10-29 ENCOUNTER — Ambulatory Visit (INDEPENDENT_AMBULATORY_CARE_PROVIDER_SITE_OTHER): Payer: Medicare Other

## 2014-10-29 DIAGNOSIS — J309 Allergic rhinitis, unspecified: Secondary | ICD-10-CM | POA: Diagnosis not present

## 2014-10-30 ENCOUNTER — Telehealth: Payer: Self-pay | Admitting: Internal Medicine

## 2014-10-30 ENCOUNTER — Ambulatory Visit (INDEPENDENT_AMBULATORY_CARE_PROVIDER_SITE_OTHER): Payer: Medicare Other

## 2014-10-30 DIAGNOSIS — J309 Allergic rhinitis, unspecified: Secondary | ICD-10-CM | POA: Diagnosis not present

## 2014-10-30 NOTE — Telephone Encounter (Signed)
Left message for patient to call back  

## 2014-11-10 ENCOUNTER — Ambulatory Visit (INDEPENDENT_AMBULATORY_CARE_PROVIDER_SITE_OTHER): Payer: Medicare Other

## 2014-11-10 ENCOUNTER — Ambulatory Visit: Payer: Medicare Other | Admitting: Internal Medicine

## 2014-11-10 DIAGNOSIS — J309 Allergic rhinitis, unspecified: Secondary | ICD-10-CM

## 2014-11-12 ENCOUNTER — Ambulatory Visit: Payer: Medicare Other

## 2014-11-12 NOTE — Telephone Encounter (Signed)
Patient cancelled her appointment. She wasn't able to come in that day. She did not reschedule. Appointment with Dr Marina Goodell 12/23/14

## 2014-11-19 ENCOUNTER — Ambulatory Visit (INDEPENDENT_AMBULATORY_CARE_PROVIDER_SITE_OTHER): Payer: Medicare Other

## 2014-11-19 DIAGNOSIS — J309 Allergic rhinitis, unspecified: Secondary | ICD-10-CM | POA: Diagnosis not present

## 2014-11-23 ENCOUNTER — Other Ambulatory Visit: Payer: Self-pay

## 2014-11-25 NOTE — Telephone Encounter (Signed)
Date Mixed: 10/30/14 Vial: 2 Strength: 1:10 Here/Mail/Pick Up: here Mixed By: tbs

## 2014-12-02 ENCOUNTER — Ambulatory Visit (INDEPENDENT_AMBULATORY_CARE_PROVIDER_SITE_OTHER): Payer: Medicare Other

## 2014-12-02 DIAGNOSIS — J309 Allergic rhinitis, unspecified: Secondary | ICD-10-CM | POA: Diagnosis not present

## 2014-12-10 ENCOUNTER — Ambulatory Visit (INDEPENDENT_AMBULATORY_CARE_PROVIDER_SITE_OTHER): Payer: Medicare Other

## 2014-12-10 DIAGNOSIS — J309 Allergic rhinitis, unspecified: Secondary | ICD-10-CM

## 2014-12-17 ENCOUNTER — Ambulatory Visit (INDEPENDENT_AMBULATORY_CARE_PROVIDER_SITE_OTHER): Payer: Medicare Other

## 2014-12-17 DIAGNOSIS — J309 Allergic rhinitis, unspecified: Secondary | ICD-10-CM

## 2014-12-18 ENCOUNTER — Encounter: Payer: Self-pay | Admitting: Internal Medicine

## 2014-12-23 ENCOUNTER — Ambulatory Visit: Payer: Medicare Other | Admitting: Internal Medicine

## 2014-12-24 ENCOUNTER — Ambulatory Visit (INDEPENDENT_AMBULATORY_CARE_PROVIDER_SITE_OTHER): Payer: Medicare Other

## 2014-12-24 DIAGNOSIS — J309 Allergic rhinitis, unspecified: Secondary | ICD-10-CM | POA: Diagnosis not present

## 2015-01-05 ENCOUNTER — Ambulatory Visit (INDEPENDENT_AMBULATORY_CARE_PROVIDER_SITE_OTHER): Payer: Medicare Other

## 2015-01-05 DIAGNOSIS — J309 Allergic rhinitis, unspecified: Secondary | ICD-10-CM

## 2015-01-20 ENCOUNTER — Encounter: Payer: Self-pay | Admitting: Internal Medicine

## 2015-01-21 ENCOUNTER — Ambulatory Visit (INDEPENDENT_AMBULATORY_CARE_PROVIDER_SITE_OTHER): Payer: Medicare Other

## 2015-01-21 DIAGNOSIS — J309 Allergic rhinitis, unspecified: Secondary | ICD-10-CM

## 2015-01-25 ENCOUNTER — Encounter: Payer: Self-pay | Admitting: Internal Medicine

## 2015-01-25 ENCOUNTER — Ambulatory Visit (INDEPENDENT_AMBULATORY_CARE_PROVIDER_SITE_OTHER): Payer: Medicare Other | Admitting: Internal Medicine

## 2015-01-25 VITALS — BP 118/74 | HR 68 | Ht 61.42 in | Wt 101.2 lb

## 2015-01-25 DIAGNOSIS — K222 Esophageal obstruction: Secondary | ICD-10-CM

## 2015-01-25 DIAGNOSIS — R1013 Epigastric pain: Secondary | ICD-10-CM

## 2015-01-25 DIAGNOSIS — K589 Irritable bowel syndrome without diarrhea: Secondary | ICD-10-CM | POA: Diagnosis not present

## 2015-01-25 DIAGNOSIS — R935 Abnormal findings on diagnostic imaging of other abdominal regions, including retroperitoneum: Secondary | ICD-10-CM

## 2015-01-25 DIAGNOSIS — K219 Gastro-esophageal reflux disease without esophagitis: Secondary | ICD-10-CM

## 2015-01-25 NOTE — Progress Notes (Signed)
HISTORY OF PRESENT ILLNESS:  [name] is a 69 y.o. female former Dr. Jarold Motto patient followed for diarrhea predominant IBS, functional dyspepsia, and GERD with peptic stricture. She established with me in June 2015. She was subsequently seen by the GI physician assistant our second 2016 regarding epigastric pain. CBC, comprehensive metabolic panel, and lipase were unremarkable. Abdominal ultrasound was unremarkable except for a 9 mm indeterminant left kidney lesion for which MRI was recommended. MRI was performed and did not demonstrate any renal lesion. She was said to have a tiny cystic structure within the tail the pancreas measuring 5 mm. Follow-up MRI in 1 year recommended. Because of ongoing dyspeptic complaints on AcipHex she did undergo upper endoscopy 09/29/2014. She was found to have a large caliber distal esophageal ring mild edema. Otherwise normal exam. AcipHex was changed to pantoprazole 40 mg daily. Because of nocturnal symptoms, ranitidine 150 mg at night also prescribed. In follow-up at this time. Overall, she reports marked improvement in symptoms. She will have intermittent sharp pains and urgency with loose stools, which is not new. She does take diarrheal's as needed. No other complaints. Multiple questions. She inquires about her last colonoscopy. This was performed December 2011. No abnormalities. Normal random colon biopsies. Also normal duodenal biopsies that day with her EGD. Follow-up in 10 years recommended. Patient's weight has been stable   REVIEW OF SYSTEMS:  All non-GI ROS negative except for sinus and allergy trouble  Past Medical History  Diagnosis Date  . Personal history of colonic polyps   . Unspecified gastritis and gastroduodenitis without mention of hemorrhage   . Hiatal hernia   . Acute sinusitis, unspecified   . Acute pharyngitis   . Dysfunction of eustachian tube   . Esophageal reflux   . Chronic airway obstruction, not elsewhere classified   .  Unspecified asthma(493.90)   . Allergic rhinitis, cause unspecified   . Schatzki's ring   . Hemorrhoids   . IBS (irritable bowel syndrome)   . Allergy     seasonal  . Osteopenia     Past Surgical History  Procedure Laterality Date  . Laparoscopic hysterectomy    . Tonsillectomy    . Foot surgery    . Cyst removal hand    . Abdominal hysterectomy    . Partial mastectomy with needle localization Left 08/27/2012    Procedure: PARTIAL MASTECTOMY WITH NEEDLE LOCALIZATION;  Surgeon: Ernestene Mention, MD;  Location: [facility] OR;  Service: General;  Laterality: Left;    Social History [name]  reports that she has never smoked. She has never used smokeless tobacco. She reports that she does not drink alcohol or use illicit drugs.  family history includes Allergies in her brother, brother, and sister; Alzheimer's disease in her father; Asthma in her maternal grandfather; Diabetes in her sister. There is no history of Colon cancer.  Allergies  Allergen Reactions  . Shellfish Allergy Anaphylaxis  . Codeine Nausea Only       PHYSICAL EXAMINATION: Vital signs: BP 118/74 mmHg  Pulse 68  Ht 5' 1.42" (1.56 m)  Wt 101 lb 4 oz (45.927 kg)  BMI 18.87 kg/m2 General: Well-developed, well-nourished, no acute distress HEENT: Sclerae are anicteric, conjunctiva pink. Oral mucosa intact Lungs: Clear Heart: Regular Abdomen: soft, nontender, nondistended, no obvious ascites, no peritoneal signs, normal bowel sounds. No organomegaly. Extremities: No edema Psychiatric: alert and oriented x3. Cooperative   ASSESSMENT:  #1. GERD with a history of peptic stricture. Recent exacerbation of GERD improved with change  in reflux medication regimen. No dysphagia #2. Functional dyspepsia. Ongoing #3. Diarrhea predominant IBS #4. Normal colonoscopy December 2011 #5. Tiny cystic lesion tell the pancreas for which MRCP recommended around March 2017   PLAN:  #1. Reflux precautions #2. Continue  pantoprazole and ranitidine #3. Anti-diarrheal's as needed #4. MRI/MRCP of the pancreas around March 2017. Recall entered into the computer by the CMA #5. Routine office follow-up in 1 year #6. Routine screening colonoscopy around December 2021  25 minutes was spent face-to-face with the patient. Greater than 50% of the time spent counseling her regarding her multiple diagnoses and treatment plan. Multiple questions answered

## 2015-01-25 NOTE — Patient Instructions (Signed)
Please follow up in one year.  You have a recall MRI of the pancreas due 08/13/2014

## 2015-02-04 ENCOUNTER — Ambulatory Visit (INDEPENDENT_AMBULATORY_CARE_PROVIDER_SITE_OTHER): Payer: Medicare Other

## 2015-02-04 DIAGNOSIS — J309 Allergic rhinitis, unspecified: Secondary | ICD-10-CM

## 2015-02-11 ENCOUNTER — Ambulatory Visit (INDEPENDENT_AMBULATORY_CARE_PROVIDER_SITE_OTHER): Payer: Medicare Other

## 2015-02-11 DIAGNOSIS — J309 Allergic rhinitis, unspecified: Secondary | ICD-10-CM

## 2015-02-16 ENCOUNTER — Ambulatory Visit (INDEPENDENT_AMBULATORY_CARE_PROVIDER_SITE_OTHER): Payer: Medicare Other

## 2015-02-16 DIAGNOSIS — Z23 Encounter for immunization: Secondary | ICD-10-CM

## 2015-02-18 ENCOUNTER — Ambulatory Visit (INDEPENDENT_AMBULATORY_CARE_PROVIDER_SITE_OTHER): Payer: Medicare Other

## 2015-02-18 DIAGNOSIS — J309 Allergic rhinitis, unspecified: Secondary | ICD-10-CM

## 2015-02-25 ENCOUNTER — Ambulatory Visit (INDEPENDENT_AMBULATORY_CARE_PROVIDER_SITE_OTHER): Payer: Medicare Other

## 2015-02-25 DIAGNOSIS — J309 Allergic rhinitis, unspecified: Secondary | ICD-10-CM | POA: Diagnosis not present

## 2015-03-03 ENCOUNTER — Ambulatory Visit (INDEPENDENT_AMBULATORY_CARE_PROVIDER_SITE_OTHER): Payer: Medicare Other

## 2015-03-03 DIAGNOSIS — J309 Allergic rhinitis, unspecified: Secondary | ICD-10-CM | POA: Diagnosis not present

## 2015-03-11 ENCOUNTER — Ambulatory Visit (INDEPENDENT_AMBULATORY_CARE_PROVIDER_SITE_OTHER): Payer: Medicare Other

## 2015-03-11 DIAGNOSIS — J309 Allergic rhinitis, unspecified: Secondary | ICD-10-CM

## 2015-03-18 ENCOUNTER — Ambulatory Visit (INDEPENDENT_AMBULATORY_CARE_PROVIDER_SITE_OTHER): Payer: Medicare Other

## 2015-03-18 DIAGNOSIS — J309 Allergic rhinitis, unspecified: Secondary | ICD-10-CM | POA: Diagnosis not present

## 2015-04-01 ENCOUNTER — Ambulatory Visit (INDEPENDENT_AMBULATORY_CARE_PROVIDER_SITE_OTHER): Payer: Medicare Other

## 2015-04-01 DIAGNOSIS — J309 Allergic rhinitis, unspecified: Secondary | ICD-10-CM | POA: Diagnosis not present

## 2015-04-05 ENCOUNTER — Telehealth: Payer: Self-pay | Admitting: Internal Medicine

## 2015-04-05 ENCOUNTER — Encounter: Payer: Self-pay | Admitting: Internal Medicine

## 2015-04-05 NOTE — Telephone Encounter (Signed)
Ok to refill  Please work with Florentina AddisonKatie to schedule ROV

## 2015-04-05 NOTE — Telephone Encounter (Signed)
Made in error

## 2015-04-05 NOTE — Telephone Encounter (Signed)
Since you're booking into Feb.. What do I do about [name]'s vac.? She's out, her last appt. Was 02/20/14. She gets her shots here.

## 2015-04-06 ENCOUNTER — Ambulatory Visit (INDEPENDENT_AMBULATORY_CARE_PROVIDER_SITE_OTHER): Payer: Medicare Other

## 2015-04-06 ENCOUNTER — Telehealth: Payer: Self-pay | Admitting: Internal Medicine

## 2015-04-06 DIAGNOSIS — J309 Allergic rhinitis, unspecified: Secondary | ICD-10-CM

## 2015-04-06 NOTE — Telephone Encounter (Signed)
Allergy Serum Extract Date Mixed: 04/06/2015 Vial: AB Strength: 1:10 Here/Mail/Pick Up: Here Mixed By: Elga Santy, CMA 

## 2015-04-06 NOTE — Telephone Encounter (Signed)
[name] made vac. And documented into epic.

## 2015-04-07 NOTE — Telephone Encounter (Signed)
Katie/CY please advise if pt needs to be worked in before next available appts in early 2017.  Thanks!

## 2015-04-07 NOTE — Telephone Encounter (Signed)
Per CY-next open appt time is fine with him. Thanks.

## 2015-04-07 NOTE — Telephone Encounter (Signed)
LMTCB to schedule appt

## 2015-04-08 ENCOUNTER — Ambulatory Visit (INDEPENDENT_AMBULATORY_CARE_PROVIDER_SITE_OTHER): Payer: Medicare Other

## 2015-04-08 DIAGNOSIS — J309 Allergic rhinitis, unspecified: Secondary | ICD-10-CM | POA: Diagnosis not present

## 2015-04-08 NOTE — Telephone Encounter (Signed)
lmomtcb x2 for pt on named VM

## 2015-04-12 NOTE — Telephone Encounter (Signed)
I'll just let pt. Know when she comes in for her allergy shot this Thurs.. Nothing further needed.

## 2015-04-15 ENCOUNTER — Encounter: Payer: Self-pay | Admitting: Gastroenterology

## 2015-04-15 ENCOUNTER — Encounter: Payer: Self-pay | Admitting: Internal Medicine

## 2015-04-15 ENCOUNTER — Ambulatory Visit (INDEPENDENT_AMBULATORY_CARE_PROVIDER_SITE_OTHER): Payer: Medicare Other

## 2015-04-15 DIAGNOSIS — J309 Allergic rhinitis, unspecified: Secondary | ICD-10-CM | POA: Diagnosis not present

## 2015-04-29 ENCOUNTER — Ambulatory Visit (INDEPENDENT_AMBULATORY_CARE_PROVIDER_SITE_OTHER): Payer: Medicare Other

## 2015-04-29 DIAGNOSIS — J309 Allergic rhinitis, unspecified: Secondary | ICD-10-CM | POA: Diagnosis not present

## 2015-05-06 ENCOUNTER — Ambulatory Visit (INDEPENDENT_AMBULATORY_CARE_PROVIDER_SITE_OTHER): Payer: Medicare Other

## 2015-05-06 DIAGNOSIS — J309 Allergic rhinitis, unspecified: Secondary | ICD-10-CM

## 2015-05-12 ENCOUNTER — Ambulatory Visit (INDEPENDENT_AMBULATORY_CARE_PROVIDER_SITE_OTHER): Payer: Medicare Other

## 2015-05-12 DIAGNOSIS — J309 Allergic rhinitis, unspecified: Secondary | ICD-10-CM

## 2015-05-19 ENCOUNTER — Ambulatory Visit (INDEPENDENT_AMBULATORY_CARE_PROVIDER_SITE_OTHER): Payer: Medicare Other

## 2015-05-19 DIAGNOSIS — J309 Allergic rhinitis, unspecified: Secondary | ICD-10-CM | POA: Diagnosis not present

## 2015-06-04 ENCOUNTER — Ambulatory Visit (INDEPENDENT_AMBULATORY_CARE_PROVIDER_SITE_OTHER): Payer: Medicare Other | Admitting: Internal Medicine

## 2015-06-04 ENCOUNTER — Ambulatory Visit (INDEPENDENT_AMBULATORY_CARE_PROVIDER_SITE_OTHER): Payer: Medicare Other

## 2015-06-04 ENCOUNTER — Encounter: Payer: Self-pay | Admitting: Internal Medicine

## 2015-06-04 VITALS — BP 132/82 | HR 83 | Ht 61.0 in | Wt 101.8 lb

## 2015-06-04 DIAGNOSIS — J309 Allergic rhinitis, unspecified: Secondary | ICD-10-CM

## 2015-06-04 DIAGNOSIS — J4520 Mild intermittent asthma, uncomplicated: Secondary | ICD-10-CM

## 2015-06-04 DIAGNOSIS — K219 Gastro-esophageal reflux disease without esophagitis: Secondary | ICD-10-CM

## 2015-06-04 DIAGNOSIS — J3089 Other allergic rhinitis: Secondary | ICD-10-CM

## 2015-06-04 DIAGNOSIS — J302 Other seasonal allergic rhinitis: Secondary | ICD-10-CM

## 2015-06-04 NOTE — Patient Instructions (Signed)
We can continue allergy vaccine 1:10 GH for another year

## 2015-06-04 NOTE — Progress Notes (Signed)
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 still on vaccine and doing okay-no reactions. Woke during the night last night with left maxillary sinus pain and pressure, nasal stuffiness, no drainage or fever. Continues allergy vaccine 1:10 GH and has done well. CXR 08/20/12- IMPRESSION:  No active cardiopulmonary abnormalities.  Original Report Authenticated By: Signa Kellaylor Stroud, M.D.  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 Mild nasal drainage. Used benadryl for yellow jacket sting.No need at all for inhaler- not wheezing. Does yoga and yardwork ok.   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 Pt c/o dry cough with some pnd. Pt is on allergy injections and reports no reactions.  Never wheezes any longer and has not needed rescue inhaler Feels that GERD is well controlled, which also helped her breathing She is satisfied to continue allergy vaccine another year with rhinitis doing better  ROS-see HPI Constitutional:   No-   weight loss, night sweats, fevers, chills, fatigue, lassitude. HEENT:   No-  headaches, difficulty swallowing, tooth/dental problems, sore throat,       No-  sneezing, itching, ear ache, +nasal congestion, +post nasal drip,  CV:  No-   chest pain, orthopnea, PND, swelling in lower extremities, anasarca,  dizziness, palpitations Resp: No-   shortness of breath with exertion or at rest.              No-   productive cough,  No non-productive cough,  No- coughing up of blood.            No-   change in color of mucus.  No- wheezing.   Skin: No-   rash or lesions. GI:  No-   heartburn, indigestion, abdominal pain, nausea, vomiting,  GU:  MS:  No-   joint pain or swelling.   Neuro-     nothing unusual Psych:  No- change in mood or affect. No depression or anxiety.  No memory loss.  OBJ- Physical Exam General- Alert, Oriented, Affect-appropriate, Distress- none acute Skin- rash-none, lesions- none, excoriation- none Lymphadenopathy- none Head- atraumatic            Eyes- Gross vision intact, PERRLA, conjunctivae and secretions clear            Ears- Hearing, canals-normal            Nose- Clear, +Septal dev, no-mucus, polyps,  erosions             Throat- Mallampati II , mucosa clear , drainage- none, tonsils- atrophic Neck- flexible , trachea midline, no stridor , thyroid nl, carotid no bruit Chest - symmetrical excursion , unlabored           Heart/CV- RRR , no murmur , no gallop  , no rub, nl s1 s2                           - JVD- none , edema- none, stasis changes- none, varices- none           Lung- clear to P&A, wheeze- none, cough-none , dullness-none, rub- none  Chest wall-  Abd-  Br/ Gen/ Rectal- Not done, not indicated Extrem- cyanosis- none, clubbing, none, atrophy- none, strength- nl Neuro- grossly intact to observation

## 2015-06-05 NOTE — Assessment & Plan Note (Signed)
She describes good control

## 2015-06-05 NOTE — Assessment & Plan Note (Signed)
Uncomplicated, well controlled without needing medication or intervention

## 2015-06-05 NOTE — Assessment & Plan Note (Signed)
She has done especially well in recent years. Plan-continue vaccine 1 more year

## 2015-06-10 ENCOUNTER — Ambulatory Visit (INDEPENDENT_AMBULATORY_CARE_PROVIDER_SITE_OTHER): Payer: Medicare Other

## 2015-06-10 DIAGNOSIS — J309 Allergic rhinitis, unspecified: Secondary | ICD-10-CM | POA: Diagnosis not present

## 2015-06-17 ENCOUNTER — Ambulatory Visit (INDEPENDENT_AMBULATORY_CARE_PROVIDER_SITE_OTHER): Payer: Medicare Other

## 2015-06-17 DIAGNOSIS — J309 Allergic rhinitis, unspecified: Secondary | ICD-10-CM | POA: Diagnosis not present

## 2015-06-24 ENCOUNTER — Ambulatory Visit (INDEPENDENT_AMBULATORY_CARE_PROVIDER_SITE_OTHER): Payer: 59

## 2015-06-24 DIAGNOSIS — J309 Allergic rhinitis, unspecified: Secondary | ICD-10-CM

## 2015-07-01 ENCOUNTER — Ambulatory Visit (INDEPENDENT_AMBULATORY_CARE_PROVIDER_SITE_OTHER): Payer: 59

## 2015-07-01 DIAGNOSIS — J309 Allergic rhinitis, unspecified: Secondary | ICD-10-CM | POA: Diagnosis not present

## 2015-07-08 ENCOUNTER — Ambulatory Visit (INDEPENDENT_AMBULATORY_CARE_PROVIDER_SITE_OTHER): Payer: 59

## 2015-07-08 DIAGNOSIS — J309 Allergic rhinitis, unspecified: Secondary | ICD-10-CM

## 2015-07-15 ENCOUNTER — Ambulatory Visit (INDEPENDENT_AMBULATORY_CARE_PROVIDER_SITE_OTHER): Payer: 59

## 2015-07-15 DIAGNOSIS — J309 Allergic rhinitis, unspecified: Secondary | ICD-10-CM | POA: Diagnosis not present

## 2015-07-21 ENCOUNTER — Encounter: Payer: Self-pay | Admitting: Internal Medicine

## 2015-07-22 ENCOUNTER — Ambulatory Visit (INDEPENDENT_AMBULATORY_CARE_PROVIDER_SITE_OTHER): Payer: Medicare Other

## 2015-07-22 DIAGNOSIS — J309 Allergic rhinitis, unspecified: Secondary | ICD-10-CM

## 2015-07-29 ENCOUNTER — Telehealth: Payer: Self-pay | Admitting: Internal Medicine

## 2015-07-29 ENCOUNTER — Other Ambulatory Visit: Payer: Self-pay

## 2015-07-29 ENCOUNTER — Ambulatory Visit (INDEPENDENT_AMBULATORY_CARE_PROVIDER_SITE_OTHER): Payer: Medicare Other | Admitting: *Deleted

## 2015-07-29 DIAGNOSIS — J309 Allergic rhinitis, unspecified: Secondary | ICD-10-CM

## 2015-07-29 DIAGNOSIS — K862 Cyst of pancreas: Secondary | ICD-10-CM

## 2015-07-29 NOTE — Telephone Encounter (Signed)
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 Pt to be NPO after 8am. Pt aware of appt. Recall entered for OV in August with Dr. Marina Goodell.

## 2015-08-05 ENCOUNTER — Ambulatory Visit (INDEPENDENT_AMBULATORY_CARE_PROVIDER_SITE_OTHER): Payer: Medicare Other | Admitting: *Deleted

## 2015-08-05 DIAGNOSIS — J309 Allergic rhinitis, unspecified: Secondary | ICD-10-CM | POA: Diagnosis not present

## 2015-08-06 ENCOUNTER — Other Ambulatory Visit: Payer: Self-pay | Admitting: Internal Medicine

## 2015-08-06 ENCOUNTER — Ambulatory Visit (HOSPITAL_COMMUNITY)
Admission: RE | Admit: 2015-08-06 | Discharge: 2015-08-06 | Disposition: A | Discharge status: Home / Self Care | Payer: Medicare Other | Source: Ambulatory Visit | Attending: Internal Medicine | Admitting: Internal Medicine

## 2015-08-06 DIAGNOSIS — K862 Cyst of pancreas: Secondary | ICD-10-CM | POA: Insufficient documentation

## 2015-08-06 DIAGNOSIS — K7689 Other specified diseases of liver: Secondary | ICD-10-CM | POA: Insufficient documentation

## 2015-08-06 LAB — POCT I-STAT CREATININE: CREATININE: 0.8 mg/dL (ref 0.44–1.00)

## 2015-08-06 MED ORDER — GADOBENATE DIMEGLUMINE 529 MG/ML IV SOLN
10.0000 mL | Freq: Once | INTRAVENOUS | Status: AC | PRN
  Administered 2015-08-06: 10 mL via INTRAVENOUS

## 2015-08-12 ENCOUNTER — Ambulatory Visit (INDEPENDENT_AMBULATORY_CARE_PROVIDER_SITE_OTHER): Payer: Medicare Other | Admitting: *Deleted

## 2015-08-12 DIAGNOSIS — J309 Allergic rhinitis, unspecified: Secondary | ICD-10-CM | POA: Diagnosis not present

## 2015-08-17 ENCOUNTER — Telehealth: Payer: Self-pay | Admitting: Internal Medicine

## 2015-08-17 DIAGNOSIS — J309 Allergic rhinitis, unspecified: Secondary | ICD-10-CM | POA: Diagnosis not present

## 2015-08-17 NOTE — Telephone Encounter (Signed)
Allergy Serum Extract Date Mixed: 08/17/15 Vial: 2 Strength: 1:10 Here/Mail/Pick Up: here Mixed By: tbs Last OV: 06/04/15 Pending OV: 06/07/16

## 2015-08-19 ENCOUNTER — Ambulatory Visit (INDEPENDENT_AMBULATORY_CARE_PROVIDER_SITE_OTHER): Payer: Medicare Other | Admitting: *Deleted

## 2015-08-19 ENCOUNTER — Emergency Department (HOSPITAL_COMMUNITY)
Admission: EM | Admit: 2015-08-19 | Discharge: 2015-08-19 | Disposition: A | Discharge status: Home / Self Care | Payer: Medicare Other | Attending: Emergency Medicine | Admitting: Emergency Medicine

## 2015-08-19 ENCOUNTER — Encounter (HOSPITAL_COMMUNITY): Payer: Self-pay

## 2015-08-19 ENCOUNTER — Emergency Department (HOSPITAL_COMMUNITY): Payer: Medicare Other

## 2015-08-19 DIAGNOSIS — S0993XA Unspecified injury of face, initial encounter: Secondary | ICD-10-CM | POA: Diagnosis present

## 2015-08-19 DIAGNOSIS — J309 Allergic rhinitis, unspecified: Secondary | ICD-10-CM | POA: Diagnosis not present

## 2015-08-19 DIAGNOSIS — J45909 Unspecified asthma, uncomplicated: Secondary | ICD-10-CM | POA: Insufficient documentation

## 2015-08-19 DIAGNOSIS — J449 Chronic obstructive pulmonary disease, unspecified: Secondary | ICD-10-CM | POA: Insufficient documentation

## 2015-08-19 DIAGNOSIS — W19XXXA Unspecified fall, initial encounter: Secondary | ICD-10-CM

## 2015-08-19 DIAGNOSIS — S00531A Contusion of lip, initial encounter: Secondary | ICD-10-CM | POA: Diagnosis not present

## 2015-08-19 DIAGNOSIS — K219 Gastro-esophageal reflux disease without esophagitis: Secondary | ICD-10-CM | POA: Insufficient documentation

## 2015-08-19 DIAGNOSIS — S80212A Abrasion, left knee, initial encounter: Secondary | ICD-10-CM | POA: Insufficient documentation

## 2015-08-19 DIAGNOSIS — W010XXA Fall on same level from slipping, tripping and stumbling without subsequent striking against object, initial encounter: Secondary | ICD-10-CM | POA: Diagnosis not present

## 2015-08-19 DIAGNOSIS — Z79899 Other long term (current) drug therapy: Secondary | ICD-10-CM | POA: Insufficient documentation

## 2015-08-19 DIAGNOSIS — Y9289 Other specified places as the place of occurrence of the external cause: Secondary | ICD-10-CM | POA: Insufficient documentation

## 2015-08-19 DIAGNOSIS — S0083XA Contusion of other part of head, initial encounter: Secondary | ICD-10-CM | POA: Insufficient documentation

## 2015-08-19 DIAGNOSIS — S0081XA Abrasion of other part of head, initial encounter: Secondary | ICD-10-CM | POA: Diagnosis not present

## 2015-08-19 DIAGNOSIS — Y998 Other external cause status: Secondary | ICD-10-CM | POA: Insufficient documentation

## 2015-08-19 DIAGNOSIS — Z8719 Personal history of other diseases of the digestive system: Secondary | ICD-10-CM | POA: Insufficient documentation

## 2015-08-19 DIAGNOSIS — Y9389 Activity, other specified: Secondary | ICD-10-CM | POA: Diagnosis not present

## 2015-08-19 DIAGNOSIS — M8580 Other specified disorders of bone density and structure, unspecified site: Secondary | ICD-10-CM | POA: Diagnosis not present

## 2015-08-19 DIAGNOSIS — Z86010 Personal history of colonic polyps: Secondary | ICD-10-CM | POA: Insufficient documentation

## 2015-08-19 MED ORDER — TRAMADOL HCL 50 MG PO TABS: 50.0000 mg | ORAL_TABLET | Freq: Four times a day (QID) | ORAL | Status: DC | PRN

## 2015-08-19 NOTE — ED Provider Notes (Signed)
CSN: [id number]     Arrival date & time 08/19/15  1539 History  By signing my name below, I, Phillis Haggis, attest that this documentation has been prepared under the direction and in the presence of Fayrene Helper, PA-C Electronically Signed: Phillis Haggis, ED Scribe. 08/19/2015. 5:07 PM.   Chief Complaint  Patient presents with  . Fall  . Facial Injury   The history is provided by the patient. No language interpreter was used.  HPI Comments: [name] is a 70 y.o. Female with a hx of osteopenia who presents to the Emergency Department complaining of a fall onset PTA. Pt reports tripping over an uneven surface when she sustained an abrasion and soreness to the nose and chin and pain to her left knee. She reports the most pain to the nose and knee that worsens with movement. She reports associated dizziness and bilateral epistaxis, right worse than left, that has since resolved. She denies LOC, chest pain, abdominal pain, SOB, nausea, or vomiting. Pt is UTD on her tdap.  Pt denies use of blood thinners.   Past Medical History  Diagnosis Date  . Personal history of colonic polyps   . Unspecified gastritis and gastroduodenitis without mention of hemorrhage   . Hiatal hernia   . Acute sinusitis, unspecified   . Acute pharyngitis   . Dysfunction of eustachian tube   . Esophageal reflux   . Chronic airway obstruction, not elsewhere classified   . Unspecified asthma(493.90)   . Allergic rhinitis, cause unspecified   . Schatzki's ring   . Hemorrhoids   . IBS (irritable bowel syndrome)   . Allergy     seasonal  . Osteopenia    Past Surgical History  Procedure Laterality Date  . Laparoscopic hysterectomy    . Tonsillectomy    . Foot surgery    . Cyst removal hand    . Abdominal hysterectomy    . Partial mastectomy with needle localization Left 08/27/2012    Procedure: PARTIAL MASTECTOMY WITH NEEDLE LOCALIZATION;  Surgeon: Ernestene Mention, MD;  Location: [facility] OR;  Service: General;   Laterality: Left;   Family History  Problem Relation Age of Onset  . Diabetes Sister   . Allergies Sister   . Asthma Maternal Grandfather   . Colon cancer Neg Hx   . Alzheimer's disease Father   . Allergies Brother   . Allergies Brother    Social History  Substance Use Topics  . Smoking status: Never Smoker   . Smokeless tobacco: Never Used  . Alcohol Use: No   OB History    No data available     Review of Systems  HENT: Positive for nosebleeds.   Respiratory: Negative for shortness of breath.   Cardiovascular: Negative for chest pain.  Gastrointestinal: Negative for nausea, vomiting and abdominal pain.  Musculoskeletal: Positive for arthralgias.  Skin: Positive for wound.  Neurological: Positive for dizziness. Negative for syncope.      Allergies  Shellfish allergy and Codeine  Home Medications   Prior to Admission medications   Medication Sig Start Date End Date Taking? Authorizing Provider  CALCIUM-VITAMIN D PO Take by mouth. 500/1000 1 tablet by mouth daily    Historical Provider, MD  Multiple Vitamin (MULTIVITAMIN WITH MINERALS) TABS Take 1 tablet by mouth daily.    Historical Provider, MD  NONFORMULARY OR COMPOUNDED ITEM Allergy Vaccine 1:10 Given at [facility]    Historical Provider, MD  pantoprazole (PROTONIX) 40 MG tablet Take 1 tablet (40 mg total)  by mouth daily. 09/29/14   Hilarie Fredrickson, MD  ranitidine (ZANTAC) 150 MG tablet Take 1 tablet (150 mg total) by mouth daily. 10/02/14   Hilarie Fredrickson, MD   BP 143/86 mmHg  Pulse 89  Temp(Src) 98 F (36.7 C) (Oral)  Resp 18  SpO2 99% Physical Exam  Constitutional: She is oriented to person, place, and time. She appears well-developed and well-nourished. No distress.  HENT:  Head: Normocephalic.  Right Ear: No hemotympanum.  Left Ear: No hemotympanum.  Nose: No septal deviation or nasal septal hematoma.  Mouth/Throat: Oropharynx is clear and moist. No oropharyngeal exudate.  Dry blood noted in left  nares; superficial abrasion noted to anterior nose and chin; no malocclusion; small bruising noted to mucosal region of the lower lip; ecchymosis noted to chin without crepitus; no scalp tenderness  Eyes: Conjunctivae and EOM are normal. Pupils are equal, round, and reactive to light.  Neck: Normal range of motion. Neck supple.  Musculoskeletal: Normal range of motion.       Left hip: Normal.       Left knee: She exhibits no deformity. Tenderness found.       Left ankle: Normal.  No C-spine tenderness; left knee: anterior knee with small abrasion noted; mild TTP; no crepitus or deformity  Neurological: She is alert and oriented to person, place, and time.  Skin: Skin is warm and dry.  Psychiatric: She has a normal mood and affect. Her behavior is normal.    ED Course  Procedures (including critical care time) DIAGNOSTIC STUDIES: Oxygen Saturation is 99% on RA, normal by my interpretation.    COORDINATION OF CARE: 5:04 PM-Discussed treatment plan which includes CT scan with pt at bedside and pt agreed to plan.   Imaging Review Ct Cervical Spine Wo Contrast  08/19/2015  CLINICAL DATA:  Larey Seat today and hit face. EXAM: CT MAXILLOFACIAL WITHOUT CONTRAST CT CERVICAL SPINE WITHOUT CONTRAST TECHNIQUE: Multidetector CT imaging of the maxillofacial structures was performed. Multiplanar CT image reconstructions were also generated. A small metallic BB was placed on the right temple in order to reliably differentiate right from left. Multidetector CT imaging of the cervical spine was performed without intravenous contrast. Multiplanar CT image reconstructions were also generated. COMPARISON:  None. FINDINGS: Maxillofacial CT: No acute facial bone fractures are identified. The mandibular condyles are normally located. No mandible fracture. Degenerative changes noted at the right temporomandibular joint. The paranasal sinuses and mastoid air cells are clear. The globes are intact. The visualized portion of  the brain appears normal. Cervical spine CT scan: Normal alignment of the cervical vertebral bodies. Disc spaces and vertebral bodies are maintained. Moderate degenerative changes at C1-2 and at C5-6. There is also mild cystic change noted in the lateral mass of C1. This could be a subchondral GE O2 or intraosseous ganglion. No acute fracture or abnormal prevertebral soft tissue swelling. The facets are normally aligned. The skullbase C1 and C1-2 articulations are maintained. The dens is normal. No large disc protrusions, spinal or foraminal stenosis. The lung apices are clear. IMPRESSION: 1. No acute facial bone fractures are identified. 2. Normal alignment of the cervical vertebral bodies and no acute fracture. Electronically Signed   By: Rudie Meyer M.D.   On: 08/19/2015 17:52   Ct Maxillofacial Wo Cm  08/19/2015  CLINICAL DATA:  Larey Seat today and hit face. EXAM: CT MAXILLOFACIAL WITHOUT CONTRAST CT CERVICAL SPINE WITHOUT CONTRAST TECHNIQUE: Multidetector CT imaging of the maxillofacial structures was performed. Multiplanar CT image reconstructions  were also generated. A small metallic BB was placed on the right temple in order to reliably differentiate right from left. Multidetector CT imaging of the cervical spine was performed without intravenous contrast. Multiplanar CT image reconstructions were also generated. COMPARISON:  None. FINDINGS: Maxillofacial CT: No acute facial bone fractures are identified. The mandibular condyles are normally located. No mandible fracture. Degenerative changes noted at the right temporomandibular joint. The paranasal sinuses and mastoid air cells are clear. The globes are intact. The visualized portion of the brain appears normal. Cervical spine CT scan: Normal alignment of the cervical vertebral bodies. Disc spaces and vertebral bodies are maintained. Moderate degenerative changes at C1-2 and at C5-6. There is also mild cystic change noted in the lateral mass of C1. This  could be a subchondral GE O2 or intraosseous ganglion. No acute fracture or abnormal prevertebral soft tissue swelling. The facets are normally aligned. The skullbase C1 and C1-2 articulations are maintained. The dens is normal. No large disc protrusions, spinal or foraminal stenosis. The lung apices are clear. IMPRESSION: 1. No acute facial bone fractures are identified. 2. Normal alignment of the cervical vertebral bodies and no acute fracture. Electronically Signed   By: Rudie MeyerP.  Gallerani M.D.   On: 08/19/2015 17:52   I have personally reviewed and evaluated these images and lab results as part of my medical decision-making.  MDM  Pt presents to the ED following a fall in which she sustained an abrasion to the nose and chin. Pt complains of left knee pain as well. Patient CT scan negative for obvious fracture or dislocation. Pain managed in ED. Pt advised to follow up if symptoms persist. Conservative therapy recommended and discussed. Patient will be dc home & is agreeable with above plan.  Final diagnoses:  Fall from standing, initial encounter  Abrasion of face, initial encounter    BP 143/86 mmHg  Pulse 89  Temp(Src) 98 F (36.7 C) (Oral)  Resp 18  SpO2 99%  I personally performed the services described in this documentation, which was scribed in my presence. The recorded information has been reviewed and is accurate.      Fayrene HelperBowie Cortez Flippen, PA-C 08/19/15 1804  Melene Planan Floyd, DO 08/19/15 2337

## 2015-08-19 NOTE — ED Notes (Signed)
Pt with fall. Tripped over step.  Pt has facial abrasion and pain to left knee.

## 2015-08-19 NOTE — Discharge Instructions (Signed)
Head Injury, Adult  You have a head injury. Headaches and throwing up (vomiting) are common after a head injury. It should be easy to wake up from sleeping. Sometimes you must stay in the hospital. Most problems happen within the first 24 hours. Side effects may occur up to 7-10 days after the injury.   WHAT ARE THE TYPES OF HEAD INJURIES?  Head injuries can be as minor as a bump. Some head injuries can be more severe. More severe head injuries include:  · A jarring injury to the brain (concussion).  · A bruise of the brain (contusion). This mean there is bleeding in the brain that can cause swelling.  · A cracked skull (skull fracture).  · Bleeding in the brain that collects, clots, and forms a bump (hematoma).  WHEN SHOULD I GET HELP RIGHT AWAY?   · You are confused or sleepy.  · You cannot be woken up.  · You feel sick to your stomach (nauseous) or keep throwing up (vomiting).  · Your dizziness or unsteadiness is getting worse.  · You have very bad, lasting headaches that are not helped by medicine. Take medicines only as told by your doctor.  · You cannot use your arms or legs like normal.  · You cannot walk.  · You notice changes in the black spots in the center of the colored part of your eye (pupil).  · You have clear or bloody fluid coming from your nose or ears.  · You have trouble seeing.  During the next 24 hours after the injury, you must stay with someone who can watch you. This person should get help right away (call 911 in the [place] if you start to shake and are not able to control it (have seizures), you pass out, or you are unable to wake up.  HOW CAN I PREVENT A HEAD INJURY IN THE FUTURE?  · Wear seat belts.  · Wear a helmet while bike riding and playing sports like football.  · Stay away from dangerous activities around the house.  WHEN CAN I RETURN TO NORMAL ACTIVITIES AND ATHLETICS?  See your doctor before doing these activities. You should not do normal activities or play contact sports until 1  week after the following symptoms have stopped:  · Headache that does not go away.  · Dizziness.  · Poor attention.  · Confusion.  · Memory problems.  · Sickness to your stomach or throwing up.  · Tiredness.  · Fussiness.  · Bothered by bright lights or loud noises.  · Anxiousness or depression.  · Restless sleep.  MAKE SURE YOU:   · Understand these instructions.  · Will watch your condition.  · Will get help right away if you are not doing well or get worse.     This information is not intended to replace advice given to you by your health care provider. Make sure you discuss any questions you have with your health care provider.     Document Released: 04/27/2008 Document Revised: 06/05/2014 Document Reviewed: 01/20/2013  Elsevier Interactive Patient Education ©2016 [employer]      Abrasion  An abrasion is a cut or scrape on the surface of your skin. An abrasion does not go through all of the layers of your skin. It is important to take good care of your abrasion to prevent infection.  HOME CARE  Medicines  · Take or apply medicines only as told by your doctor.  · If you were   prescribed an antibiotic ointment, finish all of it even if you start to feel better.  Wound Care  · Clean the wound with mild soap and water 2-3 times per day or as told by your doctor. Pat your wound dry with a clean towel. Do not rub it.  · There are many ways to close and cover a wound. Follow instructions from your doctor about:    How to take care of your wound.    When and how you should change your bandage (dressing).    When and how you should take off your dressing.  · Check your wound every day for signs of infection. Watch for:    Redness, swelling, or pain.    Fluid, blood, or pus.  General Instructions  · Keep the dressing dry as told by your doctor. Do not take baths, swim, use a hot tub, or do anything that would put your wound underwater until your doctor says it is okay.  · If there is swelling, raise (elevate) the  injured area above the level of your heart while you are sitting or lying down.  · Keep all follow-up visits as told by your doctor. This is important.  GET HELP IF:  · You were given a tetanus shot and you have any of these where the needle went in:    Swelling.    Very bad pain.    Redness.    Bleeding.  · Medicine does not help your pain.  · You have any of these at the site of the wound:    More redness.    More swelling.    More pain.  GET HELP RIGHT AWAY IF:  · You have a red streak going away from your wound.  · You have a fever.  · You have fluid, blood, or pus coming from your wound.  · There is a bad smell coming from your wound.     This information is not intended to replace advice given to you by your health care provider. Make sure you discuss any questions you have with your health care provider.     Document Released: 11/01/2007 Document Revised: 09/29/2014 Document Reviewed: 05/13/2014  Elsevier Interactive Patient Education ©2016 Elsevier Inc.

## 2015-08-26 ENCOUNTER — Ambulatory Visit (INDEPENDENT_AMBULATORY_CARE_PROVIDER_SITE_OTHER): Payer: Medicare Other

## 2015-08-26 DIAGNOSIS — J309 Allergic rhinitis, unspecified: Secondary | ICD-10-CM | POA: Diagnosis not present

## 2015-09-02 ENCOUNTER — Ambulatory Visit (INDEPENDENT_AMBULATORY_CARE_PROVIDER_SITE_OTHER): Payer: Medicare Other | Admitting: *Deleted

## 2015-09-02 DIAGNOSIS — J309 Allergic rhinitis, unspecified: Secondary | ICD-10-CM | POA: Diagnosis not present

## 2015-09-16 ENCOUNTER — Ambulatory Visit (INDEPENDENT_AMBULATORY_CARE_PROVIDER_SITE_OTHER): Payer: Medicare Other | Admitting: *Deleted

## 2015-09-16 DIAGNOSIS — J309 Allergic rhinitis, unspecified: Secondary | ICD-10-CM

## 2015-09-23 ENCOUNTER — Ambulatory Visit (INDEPENDENT_AMBULATORY_CARE_PROVIDER_SITE_OTHER): Payer: Medicare Other | Admitting: *Deleted

## 2015-09-23 DIAGNOSIS — J309 Allergic rhinitis, unspecified: Secondary | ICD-10-CM

## 2015-09-27 ENCOUNTER — Other Ambulatory Visit: Payer: Self-pay | Admitting: Internal Medicine

## 2015-09-30 ENCOUNTER — Ambulatory Visit (INDEPENDENT_AMBULATORY_CARE_PROVIDER_SITE_OTHER): Payer: Medicare Other

## 2015-09-30 DIAGNOSIS — J309 Allergic rhinitis, unspecified: Secondary | ICD-10-CM | POA: Diagnosis not present

## 2015-10-07 ENCOUNTER — Ambulatory Visit (INDEPENDENT_AMBULATORY_CARE_PROVIDER_SITE_OTHER): Payer: Medicare Other | Admitting: *Deleted

## 2015-10-07 DIAGNOSIS — J309 Allergic rhinitis, unspecified: Secondary | ICD-10-CM

## 2015-10-14 ENCOUNTER — Encounter: Payer: Self-pay | Admitting: Internal Medicine

## 2015-10-14 ENCOUNTER — Ambulatory Visit (INDEPENDENT_AMBULATORY_CARE_PROVIDER_SITE_OTHER): Payer: Medicare Other | Admitting: *Deleted

## 2015-10-14 DIAGNOSIS — J309 Allergic rhinitis, unspecified: Secondary | ICD-10-CM

## 2015-10-21 ENCOUNTER — Ambulatory Visit (INDEPENDENT_AMBULATORY_CARE_PROVIDER_SITE_OTHER): Payer: Medicare Other | Admitting: *Deleted

## 2015-10-21 DIAGNOSIS — J309 Allergic rhinitis, unspecified: Secondary | ICD-10-CM | POA: Diagnosis not present

## 2015-10-28 ENCOUNTER — Ambulatory Visit: Payer: Medicare Other

## 2015-10-28 ENCOUNTER — Ambulatory Visit (INDEPENDENT_AMBULATORY_CARE_PROVIDER_SITE_OTHER): Payer: Medicare Other | Admitting: *Deleted

## 2015-10-28 DIAGNOSIS — J309 Allergic rhinitis, unspecified: Secondary | ICD-10-CM

## 2015-11-04 ENCOUNTER — Ambulatory Visit (INDEPENDENT_AMBULATORY_CARE_PROVIDER_SITE_OTHER): Payer: Medicare Other | Admitting: *Deleted

## 2015-11-04 DIAGNOSIS — J309 Allergic rhinitis, unspecified: Secondary | ICD-10-CM

## 2015-11-11 ENCOUNTER — Encounter: Payer: Self-pay | Admitting: Internal Medicine

## 2015-11-11 ENCOUNTER — Ambulatory Visit (INDEPENDENT_AMBULATORY_CARE_PROVIDER_SITE_OTHER): Payer: Medicare Other | Admitting: *Deleted

## 2015-11-11 DIAGNOSIS — J309 Allergic rhinitis, unspecified: Secondary | ICD-10-CM | POA: Diagnosis not present

## 2015-11-18 ENCOUNTER — Ambulatory Visit (INDEPENDENT_AMBULATORY_CARE_PROVIDER_SITE_OTHER): Payer: Medicare Other | Admitting: *Deleted

## 2015-11-18 DIAGNOSIS — J309 Allergic rhinitis, unspecified: Secondary | ICD-10-CM | POA: Diagnosis not present

## 2015-11-25 ENCOUNTER — Ambulatory Visit: Payer: Medicare Other

## 2015-12-02 ENCOUNTER — Ambulatory Visit (INDEPENDENT_AMBULATORY_CARE_PROVIDER_SITE_OTHER): Payer: Medicare Other | Admitting: *Deleted

## 2015-12-02 DIAGNOSIS — J309 Allergic rhinitis, unspecified: Secondary | ICD-10-CM

## 2015-12-09 ENCOUNTER — Ambulatory Visit (INDEPENDENT_AMBULATORY_CARE_PROVIDER_SITE_OTHER): Payer: Medicare Other | Admitting: *Deleted

## 2015-12-09 DIAGNOSIS — J309 Allergic rhinitis, unspecified: Secondary | ICD-10-CM | POA: Diagnosis not present

## 2015-12-16 ENCOUNTER — Ambulatory Visit (INDEPENDENT_AMBULATORY_CARE_PROVIDER_SITE_OTHER): Payer: Medicare Other | Admitting: *Deleted

## 2015-12-16 ENCOUNTER — Ambulatory Visit: Payer: Medicare Other

## 2015-12-16 DIAGNOSIS — J309 Allergic rhinitis, unspecified: Secondary | ICD-10-CM

## 2015-12-23 ENCOUNTER — Ambulatory Visit (INDEPENDENT_AMBULATORY_CARE_PROVIDER_SITE_OTHER): Payer: Medicare Other | Admitting: *Deleted

## 2015-12-23 DIAGNOSIS — J309 Allergic rhinitis, unspecified: Secondary | ICD-10-CM

## 2015-12-30 ENCOUNTER — Ambulatory Visit (INDEPENDENT_AMBULATORY_CARE_PROVIDER_SITE_OTHER): Payer: Medicare Other | Admitting: *Deleted

## 2015-12-30 DIAGNOSIS — J309 Allergic rhinitis, unspecified: Secondary | ICD-10-CM | POA: Diagnosis not present

## 2015-12-31 ENCOUNTER — Telehealth: Payer: Self-pay | Admitting: Internal Medicine

## 2015-12-31 DIAGNOSIS — J309 Allergic rhinitis, unspecified: Secondary | ICD-10-CM | POA: Diagnosis not present

## 2015-12-31 NOTE — Telephone Encounter (Signed)
Allergy Serum Extract Date Mixed: 12/31/15 Vial: 2 Strength: 1:10 Here/Mail/Pick Up: here Mixed By: tbs Last OV: 06/04/15 Pending OV: 06/07/16

## 2016-01-06 ENCOUNTER — Ambulatory Visit (INDEPENDENT_AMBULATORY_CARE_PROVIDER_SITE_OTHER): Payer: Medicare Other

## 2016-01-06 DIAGNOSIS — J309 Allergic rhinitis, unspecified: Secondary | ICD-10-CM | POA: Diagnosis not present

## 2016-01-17 ENCOUNTER — Telehealth: Payer: Self-pay | Admitting: Internal Medicine

## 2016-01-17 NOTE — Telephone Encounter (Signed)
Called and spoke with pt and CY had some cancellations on Thursday at 230.  appt scheduled for the pt . Nothing further is needed.

## 2016-01-19 ENCOUNTER — Encounter: Payer: Self-pay | Admitting: Internal Medicine

## 2016-01-19 ENCOUNTER — Ambulatory Visit (INDEPENDENT_AMBULATORY_CARE_PROVIDER_SITE_OTHER): Payer: Medicare Other | Admitting: *Deleted

## 2016-01-19 ENCOUNTER — Ambulatory Visit (INDEPENDENT_AMBULATORY_CARE_PROVIDER_SITE_OTHER): Payer: Medicare Other | Admitting: Internal Medicine

## 2016-01-19 DIAGNOSIS — J309 Allergic rhinitis, unspecified: Secondary | ICD-10-CM | POA: Diagnosis not present

## 2016-01-19 DIAGNOSIS — K589 Irritable bowel syndrome without diarrhea: Secondary | ICD-10-CM

## 2016-01-19 DIAGNOSIS — K219 Gastro-esophageal reflux disease without esophagitis: Secondary | ICD-10-CM

## 2016-01-19 DIAGNOSIS — K591 Functional diarrhea: Secondary | ICD-10-CM | POA: Diagnosis not present

## 2016-01-19 DIAGNOSIS — R1013 Epigastric pain: Secondary | ICD-10-CM

## 2016-01-19 MED ORDER — ELUXADOLINE 100 MG PO TABS: 1.0000 | ORAL_TABLET | Freq: Two times a day (BID) | ORAL | 5 refills | Status: DC

## 2016-01-19 NOTE — Progress Notes (Signed)
HISTORY OF PRESENT ILLNESS:  [name] is a 70 y.o. female , former patient of Dr. Sheryn Bisonavid Patterson, who has been followed for diarrhea predominant IBS, functional dyspepsia, and GERD with peptic stricture. The patient established with me in June 2015. Last colonoscopy with Dr. Jarold MottoPatterson was December 2011. No abnormalities. Normal random colon biopsies. Also EGD that day with normal duodenal biopsies. I last saw the patient in the office August 2016. She was continued on pantoprazole and ranitidine for GERD. Imodium as needed for diarrhea. Follow-up MRI of the pancreas for tiny benign cystic lesion was satisfactory without additional follow-up required. Patient reports today for routine follow-up. She tells me that she does have epigastric burning at night. She relates this to stress. No dysphagia. She continues with postprandial urgency and loose stools. This occurs every day. She does take Imodium every other day which seems to slow things down. She inquires about new therapies. Otherwise her GI history and GI complaints are unchanged. She has no history of gallbladder disease or pancreatitis. She does not use alcohol. Her gallbladder is intact  REVIEW OF SYSTEMS:  All non-GI ROS negative except for anxiety, cough, sinus and allergy, headaches  Past Medical History:  Diagnosis Date  . Acute pharyngitis   . Acute sinusitis, unspecified   . Allergic rhinitis, cause unspecified   . Allergy    seasonal  . Chronic airway obstruction, not elsewhere classified   . Dysfunction of eustachian tube   . Esophageal reflux   . Hemorrhoids   . Hiatal hernia   . IBS (irritable bowel syndrome)   . Osteopenia   . Personal history of colonic polyps   . Schatzki's ring   . Unspecified asthma(493.90)   . Unspecified gastritis and gastroduodenitis without mention of hemorrhage     Past Surgical History:  Procedure Laterality Date  . ABDOMINAL HYSTERECTOMY    . CYST REMOVAL HAND    . FOOT SURGERY     . LAPAROSCOPIC HYSTERECTOMY    . PARTIAL MASTECTOMY WITH NEEDLE LOCALIZATION Left 08/27/2012   Procedure: PARTIAL MASTECTOMY WITH NEEDLE LOCALIZATION;  Surgeon: Ernestene MentionHaywood M Ingram, MD;  [facility];  Service: General;  Laterality: Left;  . TONSILLECTOMY      Social History Holly MillardGaynell Lundahl  reports that she has never smoked. She has never used smokeless tobacco. She reports that she does not drink alcohol or use drugs.  family history includes Allergies in her brother, brother, and sister; Alzheimer's disease in her father; Asthma in her maternal grandfather; Diabetes in her sister.  Allergies  Allergen Reactions  . Shellfish Allergy Anaphylaxis  . Codeine Nausea Only       PHYSICAL EXAMINATION: Vital signs: BP (P) 112/76   Pulse (P) 84   Ht (P) 5\' 1"  (1.549 m)   Wt (P) 106 lb (48.1 kg)   BMI (P) 20.03 kg/m   Constitutional: generally well-appearing, no acute distress Psychiatric: alert and oriented x3, cooperative Eyes: extraocular movements intact, anicteric, conjunctiva pink Mouth: oral pharynx moist, no lesions Neck: supple no lymphadenopathy Cardiovascular: heart regular rate and rhythm, no murmur Lungs: clear to auscultation bilaterally Abdomen: soft, nontender, nondistended, no obvious ascites, no peritoneal signs, normal bowel sounds, no organomegaly Rectal: Omitted Extremities: no clubbing cyanosis or lower extremity edema bilaterally Skin: no lesions on visible extremities Neuro: No focal deficits. Cranial nerves intact  ASSESSMENT:  #1. Diarrhea predominant IBS. We discussed symptomatic therapies. We also discussed viberzi. I discussed with her reports regarding pancreaticobiliary side effects. Also the common side  effects (5%) of constipation, nausea, and abdominal pain. She is very interested in trying this medication. #2. GERD. #3. Functional dyspepsia. I believe this is her nocturnal epigastric burning with stress #4. Last colonoscopy 2011 negative. Last  upper endoscopy 2016 with distal esophageal ring and mild mucosa edema. Otherwise normal  PLAN:  #1. Continue reflux precautions #2. Continue acid suppressive medication #3. Prescribed Viberzi 100 mg twice daily #4. Repeat screening colonoscopy 2021 #5. Follow-up GI office appointment 3 months. Contact the office in the interim for questions or problems  25 minutes was spent face-to-face with the patient. Greater than 50% a time was use for counseling regarding her GERD and diarrhea prominent IBS

## 2016-01-19 NOTE — Patient Instructions (Signed)
We have sent the following medications to your pharmacy for you to pick up at your convenience:  Viberzi  Please follow up on 04/19/2016 at 8:30am

## 2016-01-20 ENCOUNTER — Ambulatory Visit (INDEPENDENT_AMBULATORY_CARE_PROVIDER_SITE_OTHER): Payer: Medicare Other | Admitting: Internal Medicine

## 2016-01-20 ENCOUNTER — Encounter: Payer: Self-pay | Admitting: Internal Medicine

## 2016-01-20 VITALS — BP 124/76 | HR 100 | Ht 62.0 in | Wt 104.2 lb

## 2016-01-20 DIAGNOSIS — J4520 Mild intermittent asthma, uncomplicated: Secondary | ICD-10-CM

## 2016-01-20 DIAGNOSIS — J302 Other seasonal allergic rhinitis: Secondary | ICD-10-CM

## 2016-01-20 DIAGNOSIS — J309 Allergic rhinitis, unspecified: Secondary | ICD-10-CM

## 2016-01-20 DIAGNOSIS — J3089 Other allergic rhinitis: Principal | ICD-10-CM

## 2016-01-20 NOTE — Patient Instructions (Addendum)
As discussed- we can continue allergy vaccine until this winter. You will be able to stop and see how you do off shots. You will be able to transfer to another allergy practice in town if you need to.   You can ask your pharmacist for 6 hour Sudafed if needed for stuffiness

## 2016-01-20 NOTE — Assessment & Plan Note (Signed)
Mild seasonal rhinitis increase. We talked about use of Sudafed as a decongestant, compared with nasal steroid sprays and antihistamines. We discussed anticipating closure of our allergy clinic consult slow my practice this winter. She can stop allergy shots at any point and observe, with option to transfer to another allergy practice.

## 2016-01-20 NOTE — Progress Notes (Signed)
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 still on vaccine and doing okay-no reactions. Woke during the night last night with left maxillary sinus pain and pressure, nasal stuffiness, no drainage or fever. Continues allergy vaccine 1:10 GH and has done well. CXR 08/20/12- IMPRESSION:  No active cardiopulmonary abnormalities.  Original Report Authenticated By: Signa Kellaylor Stroud, M.D.  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 Mild nasal drainage. Used benadryl for yellow jacket sting.No need at all for inhaler- not wheezing. Does yoga and yardwork ok.   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 Pt c/o dry cough with some pnd. Pt is on allergy injections and reports no reactions.  Never wheezes any longer and has not needed rescue inhaler Feels that GERD is well controlled, which also helped her breathing She is satisfied to continue allergy vaccine another year with rhinitis doing better  01/20/2016-70 year old female never smoker followed for Allergic rhinitis, asthma, complicated by ulcerative colitis Allergy Vaccine 1:10 GH FOLLOWS FOR:pt tolerating allergy vaccine well, c/o nasal congestion, cough, headaches & ear pressure feels it's due to weather change  Recent increased nasal congestion with little drainage or sneezing, may correspond to current increasing ragweed pollen. No cough or wheeze and has not used rescue inhaler in a long time.  ROS-see HPI Constitutional:   No-   weight loss,  night sweats, fevers, chills, fatigue, lassitude. HEENT:   No-  headaches, difficulty swallowing, tooth/dental problems, sore throat,       No-  sneezing, itching, ear ache, +nasal congestion, +post nasal drip,  CV:  No-   chest pain, orthopnea, PND, swelling in lower extremities, anasarca,  dizziness, palpitations Resp: No-   shortness of breath with exertion or at rest.              No-   productive cough,  No non-productive cough,  No- coughing up of blood.              No-   change in color of mucus.  No- wheezing.   Skin: No-   rash or lesions. GI:  No-   heartburn, indigestion, abdominal pain, nausea, vomiting,  GU:  MS:  No-   joint pain or swelling.   Neuro-     nothing unusual Psych:  No- change in mood or affect. No depression or anxiety.  No memory loss.  OBJ- Physical Exam General- Alert, Oriented, Affect-appropriate, Distress- none acute Skin- rash-none, lesions- none, excoriation- none Lymphadenopathy- none Head- atraumatic            Eyes- Gross vision intact, PERRLA, conjunctivae and secretions clear            Ears- Hearing, canals-normal            Nose- + turbinate edema, +Septal dev, no-mucus, polyps,  erosions             Throat- Mallampati II , mucosa clear , drainage- none, tonsils- atrophic Neck- flexible , trachea midline, no stridor , thyroid nl, carotid no bruit Chest - symmetrical excursion , unlabored  Heart/CV- RRR , no murmur , no gallop  , no rub, nl s1 s2                           - JVD- none , edema- none, stasis changes- none, varices- none           Lung- clear to P&A, wheeze- none, cough-none , dullness-none, rub- none           Chest wall-  Abd-  Br/ Gen/ Rectal- Not done, not indicated Extrem- cyanosis- none, clubbing, none, atrophy- none, strength- nl Neuro- grossly intact to observation

## 2016-01-20 NOTE — Assessment & Plan Note (Signed)
Very mild, uncomplicated. Not currently needing medication. Discussed in anticipation of winter months.

## 2016-01-21 NOTE — Progress Notes (Signed)
Sent prior authorization for Viberzi to [employer]. Awaiting response.

## 2016-01-26 ENCOUNTER — Ambulatory Visit (INDEPENDENT_AMBULATORY_CARE_PROVIDER_SITE_OTHER): Payer: Medicare Other | Admitting: *Deleted

## 2016-01-26 DIAGNOSIS — J309 Allergic rhinitis, unspecified: Secondary | ICD-10-CM

## 2016-02-07 ENCOUNTER — Ambulatory Visit: Payer: Medicare Other

## 2016-02-10 ENCOUNTER — Ambulatory Visit: Payer: Medicare Other

## 2016-02-14 ENCOUNTER — Ambulatory Visit
Admission: RE | Admit: 2016-02-14 | Discharge: 2016-02-14 | Disposition: A | Discharge status: Home / Self Care | Payer: Medicare Other | Source: Ambulatory Visit | Attending: Orthopedic Surgery | Admitting: Orthopedic Surgery

## 2016-02-14 ENCOUNTER — Other Ambulatory Visit: Payer: Self-pay | Admitting: Orthopedic Surgery

## 2016-02-14 DIAGNOSIS — M25512 Pain in left shoulder: Secondary | ICD-10-CM

## 2016-02-17 ENCOUNTER — Ambulatory Visit: Payer: Medicare Other

## 2016-02-18 ENCOUNTER — Other Ambulatory Visit: Payer: Self-pay | Admitting: Internal Medicine

## 2016-02-24 ENCOUNTER — Ambulatory Visit (INDEPENDENT_AMBULATORY_CARE_PROVIDER_SITE_OTHER): Payer: Medicare Other | Admitting: *Deleted

## 2016-02-24 DIAGNOSIS — J309 Allergic rhinitis, unspecified: Secondary | ICD-10-CM

## 2016-02-29 ENCOUNTER — Ambulatory Visit (INDEPENDENT_AMBULATORY_CARE_PROVIDER_SITE_OTHER): Payer: Medicare Other

## 2016-02-29 DIAGNOSIS — Z23 Encounter for immunization: Secondary | ICD-10-CM | POA: Diagnosis not present

## 2016-03-02 ENCOUNTER — Ambulatory Visit: Payer: Medicare Other

## 2016-03-09 ENCOUNTER — Ambulatory Visit (INDEPENDENT_AMBULATORY_CARE_PROVIDER_SITE_OTHER): Payer: Medicare Other | Admitting: *Deleted

## 2016-03-09 DIAGNOSIS — J309 Allergic rhinitis, unspecified: Secondary | ICD-10-CM

## 2016-03-16 ENCOUNTER — Ambulatory Visit (INDEPENDENT_AMBULATORY_CARE_PROVIDER_SITE_OTHER): Payer: Medicare Other | Admitting: *Deleted

## 2016-03-16 DIAGNOSIS — J309 Allergic rhinitis, unspecified: Secondary | ICD-10-CM

## 2016-03-23 ENCOUNTER — Ambulatory Visit (INDEPENDENT_AMBULATORY_CARE_PROVIDER_SITE_OTHER): Payer: Medicare Other | Admitting: *Deleted

## 2016-03-23 DIAGNOSIS — J309 Allergic rhinitis, unspecified: Secondary | ICD-10-CM

## 2016-03-30 ENCOUNTER — Ambulatory Visit (INDEPENDENT_AMBULATORY_CARE_PROVIDER_SITE_OTHER): Payer: Medicare Other | Admitting: *Deleted

## 2016-03-30 DIAGNOSIS — J309 Allergic rhinitis, unspecified: Secondary | ICD-10-CM | POA: Diagnosis not present

## 2016-04-06 ENCOUNTER — Ambulatory Visit (INDEPENDENT_AMBULATORY_CARE_PROVIDER_SITE_OTHER): Payer: Medicare Other | Admitting: *Deleted

## 2016-04-06 DIAGNOSIS — J309 Allergic rhinitis, unspecified: Secondary | ICD-10-CM | POA: Diagnosis not present

## 2016-04-13 ENCOUNTER — Ambulatory Visit (INDEPENDENT_AMBULATORY_CARE_PROVIDER_SITE_OTHER): Payer: Medicare Other | Admitting: *Deleted

## 2016-04-13 DIAGNOSIS — J309 Allergic rhinitis, unspecified: Secondary | ICD-10-CM | POA: Diagnosis not present

## 2016-04-19 ENCOUNTER — Ambulatory Visit: Payer: Medicare Other | Admitting: Internal Medicine

## 2016-04-27 ENCOUNTER — Ambulatory Visit (INDEPENDENT_AMBULATORY_CARE_PROVIDER_SITE_OTHER): Payer: Medicare Other | Admitting: *Deleted

## 2016-04-27 DIAGNOSIS — J309 Allergic rhinitis, unspecified: Secondary | ICD-10-CM

## 2016-05-15 ENCOUNTER — Ambulatory Visit (INDEPENDENT_AMBULATORY_CARE_PROVIDER_SITE_OTHER): Payer: Medicare Other | Admitting: *Deleted

## 2016-05-15 DIAGNOSIS — J309 Allergic rhinitis, unspecified: Secondary | ICD-10-CM

## 2016-05-16 ENCOUNTER — Telehealth: Payer: Self-pay | Admitting: Internal Medicine

## 2016-05-16 NOTE — Telephone Encounter (Signed)
Called pt. Reminded her of CY's recs from his ov notes. Pt. Understood and is going to take her vaccine with her to her appointment In January, at [facility] [place]. Nothing further needed.

## 2016-05-25 ENCOUNTER — Ambulatory Visit (INDEPENDENT_AMBULATORY_CARE_PROVIDER_SITE_OTHER): Payer: Medicare Other | Admitting: *Deleted

## 2016-05-25 DIAGNOSIS — J309 Allergic rhinitis, unspecified: Secondary | ICD-10-CM

## 2016-06-07 ENCOUNTER — Telehealth: Payer: Self-pay | Admitting: Internal Medicine

## 2016-06-07 ENCOUNTER — Encounter: Payer: Self-pay | Admitting: Internal Medicine

## 2016-06-07 ENCOUNTER — Ambulatory Visit (INDEPENDENT_AMBULATORY_CARE_PROVIDER_SITE_OTHER)
Admission: RE | Admit: 2016-06-07 | Discharge: 2016-06-07 | Disposition: A | Discharge status: Home / Self Care | Payer: Medicare Other | Source: Ambulatory Visit | Attending: Internal Medicine | Admitting: Internal Medicine

## 2016-06-07 ENCOUNTER — Ambulatory Visit (INDEPENDENT_AMBULATORY_CARE_PROVIDER_SITE_OTHER): Payer: Medicare Other | Admitting: Internal Medicine

## 2016-06-07 VITALS — BP 114/66 | HR 78 | Ht 62.0 in | Wt 108.6 lb

## 2016-06-07 DIAGNOSIS — J4520 Mild intermittent asthma, uncomplicated: Secondary | ICD-10-CM | POA: Diagnosis not present

## 2016-06-07 DIAGNOSIS — J3089 Other allergic rhinitis: Secondary | ICD-10-CM

## 2016-06-07 DIAGNOSIS — R059 Cough, unspecified: Secondary | ICD-10-CM

## 2016-06-07 DIAGNOSIS — J302 Other seasonal allergic rhinitis: Secondary | ICD-10-CM | POA: Diagnosis not present

## 2016-06-07 DIAGNOSIS — R05 Cough: Secondary | ICD-10-CM

## 2016-06-07 DIAGNOSIS — R911 Solitary pulmonary nodule: Secondary | ICD-10-CM

## 2016-06-07 NOTE — Telephone Encounter (Signed)
Pt aware of results and voiced her understanding. Pt agreed to CT. CT has been ordered. Nothing further needed.

## 2016-06-07 NOTE — Telephone Encounter (Signed)
CXR- 2 very small nodules in the left lung were not seen before. We can't tell what they are. Recommend CT scan.  Order- CT chest, no contrast   Dx lung nodules

## 2016-06-07 NOTE — Patient Instructions (Signed)
Order- CXR-  Dx asthma mild intermittent, cough  Order- schedule PFT    Keep taking the acid controller  Keep the allergy office appointment as planned

## 2016-06-07 NOTE — Assessment & Plan Note (Signed)
She has wanted to continue allergy vaccine but has been on it for years. She is going for new opinion with option to watch and see how she does with symptomatic meds or update skin testing.

## 2016-06-07 NOTE — Assessment & Plan Note (Signed)
No wheezing or sleep disturbance. Nonspecific cough she associates with cold weather might be from postnasal drip, reflux or cough equivalent asthma. Plan-CXR, PFT

## 2016-06-07 NOTE — Telephone Encounter (Signed)
IMPRESSION: Nodular opacities in the upper and mid lung regions on the left, not present on prior study. Advise noncontrast enhanced chest CT to further assess. Lungs elsewhere clear. Aortic atherosclerosis present.  These results will be called to the ordering clinician or representative by the Radiologist Assistant, and communication documented in the PACS or zVision Dashboard.

## 2016-06-07 NOTE — Progress Notes (Signed)
HPI female never smoker followed for Allergic rhinitis, asthma, complicated by ulcerative colitis PFT 2011 mild obstruction with air trapping ------------------------------------------------------  01/20/2016-71 year old female never smoker followed for Allergic rhinitis, asthma, complicated by ulcerative colitis Allergy Vaccine 1:10 GH FOLLOWS FOR:pt tolerating allergy vaccine well, c/o nasal congestion, cough, headaches & ear pressure feels it's due to weather change  Recent increased nasal congestion with little drainage or sneezing, may correspond to current increasing ragweed pollen. No cough or wheeze and has not used rescue inhaler in a long time.  06/07/2016-71 year old female never smoker followed for Allergic rhinitis, Asthma, complicated by ulcerative colitis Allergy Vaccine 1:10 ended 05/28/2016 at the end of our program at this office FOLLOWS FOR: Pt has upcoming appt with Dr Kathyrn [facility] 06-20-16. Pt continues to have cough No recent obvious infection but started coughing as cold weather began this December-scant white but can cough until she retches. No wheeze or chest pain. No inhalers. Larey SeatFell last month with fracture clavicle. Continues Protonix for GERD, usually fair control. Bothersome postnasal drip, nonpurulent. Continues yoga with aromatherapy and essential oils. Does her own yard work outdoors.  ROS-see HPI  "+" = pos Constitutional:   No-   weight loss, night sweats, fevers, chills, fatigue, lassitude. HEENT:   No-  headaches, difficulty swallowing, tooth/dental problems, sore throat,       No-  sneezing, itching, ear ache, +nasal congestion, +post nasal drip,  CV:  No-   chest pain, orthopnea, PND, swelling in lower extremities, anasarca,  dizziness, palpitations Resp: No-   shortness of breath with exertion or at rest.              +   productive cough,  No non-productive cough,  No- coughing up of blood.              No-   change in color of mucus.  No- wheezing.    Skin: No-   rash or lesions. GI:  + heartburn, indigestion, abdominal pain, nausea, vomiting,  GU:  MS:  No-   joint pain or swelling.   Neuro-     nothing unusual Psych:  No- change in mood or affect. No depression or anxiety.  No memory loss.  OBJ- Physical Exam General- Alert, Oriented, Affect-appropriate, Distress- none acute Skin- rash-none, lesions- none, excoriation- none Lymphadenopathy- none Head- atraumatic            Eyes- Gross vision intact, PERRLA, conjunctivae and secretions clear            Ears- Hearing, canals-normal            Nose-  turbinate edema, +Septal dev, no-mucus, polyps,  erosions             Throat- Mallampati II , mucosa clear , drainage- none, tonsils- atrophic Neck- flexible , trachea midline, no stridor , thyroid nl, carotid no bruit Chest - symmetrical excursion , unlabored           Heart/CV- RRR , no murmur , no gallop  , no rub, nl s1 s2                           - JVD- none , edema- none, stasis changes- none, varices- none           Lung- clear to P&A, wheeze- none, cough-none , dullness-none, rub- none           Chest wall-  Abd-  Br/ Gen/ Rectal- Not done,  not indicated Extrem- cyanosis- none, clubbing, none, atrophy- none, strength- nl Neuro- grossly intact to observation

## 2016-06-16 ENCOUNTER — Inpatient Hospital Stay: Admission: RE | Admit: 2016-06-16 | Payer: Medicare Other | Source: Ambulatory Visit

## 2016-06-16 ENCOUNTER — Encounter (HOSPITAL_COMMUNITY): Payer: Medicare Other

## 2016-06-20 ENCOUNTER — Ambulatory Visit: Payer: Self-pay | Admitting: Allergy and Immunology

## 2016-06-21 ENCOUNTER — Ambulatory Visit (INDEPENDENT_AMBULATORY_CARE_PROVIDER_SITE_OTHER)
Admission: RE | Admit: 2016-06-21 | Discharge: 2016-06-21 | Disposition: A | Discharge status: Home / Self Care | Payer: Medicare Other | Source: Ambulatory Visit | Attending: Internal Medicine | Admitting: Internal Medicine

## 2016-06-21 ENCOUNTER — Ambulatory Visit (HOSPITAL_COMMUNITY)
Admission: RE | Admit: 2016-06-21 | Discharge: 2016-06-21 | Disposition: A | Discharge status: Home / Self Care | Payer: Medicare Other | Source: Ambulatory Visit | Attending: Internal Medicine | Admitting: Internal Medicine

## 2016-06-21 DIAGNOSIS — J4520 Mild intermittent asthma, uncomplicated: Secondary | ICD-10-CM | POA: Insufficient documentation

## 2016-06-21 DIAGNOSIS — R911 Solitary pulmonary nodule: Secondary | ICD-10-CM | POA: Diagnosis not present

## 2016-06-21 LAB — PULMONARY FUNCTION TEST
DL/VA % PRED: 108 %
DL/VA: 4.91 ml/min/mmHg/L
DLCO unc % pred: 75 %
DLCO unc: 16.35 ml/min/mmHg
FEF 25-75 POST: 1.16 L/s
FEF 25-75 Pre: 0.68 L/sec
FEF2575-%Change-Post: 71 %
FEF2575-%Pred-Post: 66 %
FEF2575-%Pred-Pre: 38 %
FEV1-%CHANGE-POST: 12 %
FEV1-%PRED-PRE: 60 %
FEV1-%Pred-Post: 68 %
FEV1-PRE: 1.25 L
FEV1-Post: 1.41 L
FEV1FVC-%Change-Post: 5 %
FEV1FVC-%Pred-Pre: 89 %
FEV6-%Change-Post: 7 %
FEV6-%PRED-PRE: 69 %
FEV6-%Pred-Post: 75 %
FEV6-POST: 1.97 L
FEV6-PRE: 1.82 L
FEV6FVC-%Change-Post: 0 %
FEV6FVC-%PRED-POST: 104 %
FEV6FVC-%PRED-PRE: 104 %
FVC-%Change-Post: 7 %
FVC-%PRED-PRE: 67 %
FVC-%Pred-Post: 71 %
FVC-POST: 1.97 L
FVC-PRE: 1.84 L
POST FEV6/FVC RATIO: 100 %
PRE FEV6/FVC RATIO: 99 %
Post FEV1/FVC ratio: 72 %
Pre FEV1/FVC ratio: 68 %
RV % pred: 140 %
RV: 2.95 L
TLC % PRED: 99 %
TLC: 4.73 L

## 2016-06-21 MED ORDER — ALBUTEROL SULFATE (2.5 MG/3ML) 0.083% IN NEBU
2.5000 mg | INHALATION_SOLUTION | Freq: Once | RESPIRATORY_TRACT | Status: AC
  Administered 2016-06-21: 2.5 mg via RESPIRATORY_TRACT

## 2016-06-26 ENCOUNTER — Telehealth: Payer: Self-pay | Admitting: Internal Medicine

## 2016-06-26 NOTE — Telephone Encounter (Signed)
Notes Recorded by Waymon Budgelinton D Young, MD on 06/22/2016 at 1:39 PM EST CT chest- looks ok. There is a tiny nodule in the left lung which will probably be a little scar.  Recommend we order fut ureCT chest no contrast to be done in one year, for dx lung nodule. ---------------------------------------------- Spoke with pt. She is aware of results. Nothing further was needed.

## 2016-07-10 ENCOUNTER — Encounter: Payer: Self-pay | Admitting: Allergy and Immunology

## 2016-07-10 ENCOUNTER — Ambulatory Visit (INDEPENDENT_AMBULATORY_CARE_PROVIDER_SITE_OTHER): Payer: Medicare Other | Admitting: Allergy and Immunology

## 2016-07-10 VITALS — BP 150/88 | HR 80 | Temp 97.5°F | Resp 18 | Ht 61.0 in | Wt 106.2 lb

## 2016-07-10 DIAGNOSIS — R030 Elevated blood-pressure reading, without diagnosis of hypertension: Secondary | ICD-10-CM | POA: Diagnosis not present

## 2016-07-10 DIAGNOSIS — Z91018 Allergy to other foods: Secondary | ICD-10-CM | POA: Diagnosis not present

## 2016-07-10 DIAGNOSIS — J3089 Other allergic rhinitis: Secondary | ICD-10-CM | POA: Diagnosis not present

## 2016-07-10 DIAGNOSIS — J302 Other seasonal allergic rhinitis: Secondary | ICD-10-CM

## 2016-07-10 DIAGNOSIS — J4520 Mild intermittent asthma, uncomplicated: Secondary | ICD-10-CM | POA: Diagnosis not present

## 2016-07-10 MED ORDER — FLUTICASONE PROPIONATE 50 MCG/ACT NA SUSP: 2.0000 | Freq: Every day | NASAL | 1 refills | Status: DC | PRN

## 2016-07-10 NOTE — Assessment & Plan Note (Addendum)
   We will discontinue aeroallergen immunotherapy at this time.  If over the course of the next few months her nasal/sinus symptoms progress, we will consider restarting aeroallergen immunotherapy with updated vials.  A prescription has been provided for fluticasone nasal spray, 2 sprays per nostril daily as needed. Proper nasal spray technique has been discussed and demonstrated.  I have also recommended nasal saline spray (i.e., Simply Saline) or nasal saline lavage (i.e., NeilMed) as needed prior to medicated nasal sprays.  For thick post nasal drainage, add guaifenesin 600 mg (Mucinex)  twice daily as needed with adequate hydration as discussed.

## 2016-07-10 NOTE — Assessment & Plan Note (Addendum)
   The patient has been made aware of the elevated blood pressure reading and has been encouraged to follow up with her primary care physician in the near future regarding this issue.  She has verbalized understanding and agreed to do so. 

## 2016-07-10 NOTE — Progress Notes (Addendum)
New Patient Note  RE: [name] MRN: [id number] DOB: 04/28/1946 Date of Office Visit: 07/10/2016  Referring provider: Thayer HeadingsMackenzie, Brian, MD Primary care provider: Thayer HeadingsMACKENZIE,BRIAN, MD  Chief Complaint: Allergic Rhinitis    History of present illness: Holly MillardGaynell Fifita is a 71 y.o. female seen today in consultation requested by Thayer HeadingsBrian Mackenzie, MD.  She has been treated Dr. Jetty Duhamellinton Young for allergic rhinitis and allergic bronchitis and is transferring her allergy care to our office.  She reports that she has been on aeroallergen immunotherapy injections for approximately 20 years area she has noticed a decrease in episodes of bronchitis, however she still experiences one to 2 sinus infections per year.  Her sinus symptoms including nasal congestion, sinus pressure, and thick postnasal drainage tend to be triggered by rapid weather changes.  She complains of a cough which she believes is due to postnasal drainage though she is uncertain if reflux is contributing.  She states that her acid reflux is relatively well-controlled with pantoprazole, however she still experiences heartburn if she eats a large meal.  She has a history of mild intermittent asthma. She notes that in approximately 15 years ago she consumed shrimp and within minutes developed facial edema.  The symptoms resolved over the next hour or 2 without medical intervention.  She has avoided shellfish since that time but does not have access to epinephrine autoinjector.   Assessment and plan: Seasonal and perennial allergic rhinitis  We will discontinue aeroallergen immunotherapy at this time.  If over the course of the next few months her nasal/sinus symptoms progress, we will consider restarting aeroallergen immunotherapy with updated vials.  A prescription has been provided for fluticasone nasal spray, 2 sprays per nostril daily as needed. Proper nasal spray technique has been discussed and demonstrated.  I have also  recommended nasal saline spray (i.e., Simply Saline) or nasal saline lavage (i.e., NeilMed) as needed prior to medicated nasal sprays.  For thick post nasal drainage, add guaifenesin 600 mg (Mucinex)  twice daily as needed with adequate hydration as discussed.  History of food allergy The patient's history suggests shellfish allergy, however food allergen skin tests were negative today despite a positive histamine control.  The negative predictive value of food allergy skin testing is excellent, however there is still 5% chance that the allergy exists.  Lab order has been provided for serum specific IgE against shellfish panel, beef, pork, and corn.  If lab work is negative, we will proceed with open graded oral challenge to shrimp.  Until food allergy has been definitively ruled out, she will continue to avoid these foods.  Asthma, mild intermittent  Albuterol HFA, 1-2 inhalations every 4-6 hours as needed.  Elevated blood-pressure reading without diagnosis of hypertension  The patient has been made aware of the elevated blood pressure reading and has been encouraged to follow up with her primary care physician in the near future regarding this issue.  She has verbalized understanding and agreed to do so.   Meds ordered this encounter  Medications  . fluticasone (FLONASE) 50 MCG/ACT nasal spray    Sig: Place 2 sprays into both nostrils daily as needed for allergies or rhinitis.    Dispense:  16 g    Refill:  1    Diagnostics: Environmental skin testing: Positive to dog epithelia. Food allergen skin testing:  Negative despite a positive histamine control. Spirometry: FVC was 1.84 L (74% predicted) and FEV1 was 1.18 L (60% predicted) with 120 mL (11%) postbronchodilator improvement.  Mild  restrictive/obstructive pattern with partial reversibility.  Please see scanned spirometry results for details.    Physical examination: Blood pressure (!) 150/88, pulse 80, temperature 97.5 F  (36.4 C), temperature source Oral, resp. rate 18, height 5\' 1"  (1.549 m), weight 106 lb 3.2 oz (48.2 kg), SpO2 98 %.  General: Alert, interactive, in no acute distress. HEENT: TMs pearly gray, turbinates mildly edematous without discharge, post-pharynx mildly erythematous. Neck: Supple without lymphadenopathy. Lungs: Clear to auscultation without wheezing, rhonchi or rales. CV: Normal S1, S2 without murmurs. Abdomen: Nondistended, nontender. Skin: Warm and dry, without lesions or rashes. Extremities:  No clubbing, cyanosis or edema. Neuro:   Grossly intact.  Review of systems:  Review of systems negative except as noted in HPI / PMHx or noted below: Review of Systems  Constitutional: Negative.   HENT: Negative.   Eyes: Negative.   Respiratory: Negative.   Cardiovascular: Negative.   Gastrointestinal: Negative.   Genitourinary: Negative.   Musculoskeletal: Negative.   Skin: Negative.   Neurological: Negative.   Endo/Heme/Allergies: Negative.   Psychiatric/Behavioral: Negative.     Past medical history:  Past Medical History:  Diagnosis Date  . Acute pharyngitis   . Acute sinusitis, unspecified   . Allergic rhinitis, cause unspecified   . Allergy    seasonal  . Asthma   . Chronic airway obstruction, not elsewhere classified   . Dysfunction of eustachian tube   . Esophageal reflux   . Hemorrhoids   . Hiatal hernia   . IBS (irritable bowel syndrome)   . Osteopenia   . Personal history of colonic polyps   . Schatzki's ring   . Unspecified asthma(493.90)   . Unspecified gastritis and gastroduodenitis without mention of hemorrhage     Past surgical history:  Past Surgical History:  Procedure Laterality Date  . ABDOMINAL HYSTERECTOMY    . CYST REMOVAL HAND    . FOOT SURGERY    . LAPAROSCOPIC HYSTERECTOMY    . PARTIAL MASTECTOMY WITH NEEDLE LOCALIZATION Left 08/27/2012   Procedure: PARTIAL MASTECTOMY WITH NEEDLE LOCALIZATION;  Surgeon: Ernestene Mention, MD;  Location:  [facility] OR;  Service: General;  Laterality: Left;  . TONSILLECTOMY      Family history: Family History  Problem Relation Age of Onset  . Diabetes Sister   . Allergies Sister   . Alzheimer's disease Father   . Allergies Brother   . Allergies Brother   . Asthma Maternal Grandfather   . Colon cancer Neg Hx     Social history: Social History   Social History  . Marital status: Widowed    Spouse name: N/A  . Number of children: N/A  . Years of education: N/A   Occupational History  . Retired     Designer, industrial/product for [employer]   Social History Main Topics  . Smoking status: Never Smoker  . Smokeless tobacco: Never Used  . Alcohol use No  . Drug use: No  . Sexual activity: Not on file   Other Topics Concern  . Not on file   Social History Narrative  . No narrative on file   Environmental History: The patient lives in a 71 year old house with carpeting throughout, gas heat, and central air.  There is a dog in house which has access to her bedroom.  There is no known water damage or mold issue in the home.  She is a nonsmoker.  Allergies as of 07/10/2016      Reactions   Shellfish Allergy Anaphylaxis   Codeine  Nausea Only      Medication List       Accurate as of 07/10/16  8:46 PM. Always use your most recent med list.          bismuth subsalicylate 262 MG/15ML suspension Commonly known as:  PEPTO BISMOL Take 30 mLs by mouth at bedtime as needed.   calcium carbonate 500 MG chewable tablet Commonly known as:  TUMS - dosed in mg elemental calcium Chew 2 tablets by mouth at bedtime.   CALCIUM-VITAMIN D PO Take by mouth. 500/1000 1 tablet by mouth daily   fluticasone 50 MCG/ACT nasal spray Commonly known as:  FLONASE Place 2 sprays into both nostrils daily as needed for allergies or rhinitis.   loperamide 2 MG capsule Commonly known as:  IMODIUM Take 2 mg by mouth as needed for diarrhea or loose stools.   multivitamin with minerals Tabs tablet Take 1  tablet by mouth daily.   NONFORMULARY OR COMPOUNDED ITEM Allergy Vaccine 1:10 Given at [facility]   pantoprazole 40 MG tablet Commonly known as:  PROTONIX Take 1 tablet by mouth  daily       Known medication allergies: Allergies  Allergen Reactions  . Shellfish Allergy Anaphylaxis  . Codeine Nausea Only    I appreciate the opportunity to take part in [place] care. Please do not hesitate to contact me with questions.  Sincerely,   R. Jorene Guest, MD

## 2016-07-10 NOTE — Assessment & Plan Note (Signed)
The patient's history suggests shellfish allergy, however food allergen skin tests were negative today despite a positive histamine control.  The negative predictive value of food allergy skin testing is excellent, however there is still 5% chance that the allergy exists.  Lab order has been provided for serum specific IgE against shellfish panel, beef, pork, and corn.  If lab work is negative, we will proceed with open graded oral challenge to shrimp.  Until food allergy has been definitively ruled out, she will continue to avoid these foods.

## 2016-07-10 NOTE — Patient Instructions (Addendum)
Seasonal and perennial allergic rhinitis  We will discontinue aeroallergen immunotherapy at this time.  If over the course of the next few months her nasal/sinus symptoms progress, we will consider restarting aeroallergen immunotherapy with updated vials.  A prescription has been provided for fluticasone nasal spray, 2 sprays per nostril daily as needed. Proper nasal spray technique has been discussed and demonstrated.  I have also recommended nasal saline spray (i.e., Simply Saline) or nasal saline lavage (i.e., NeilMed) as needed prior to medicated nasal sprays.  For thick post nasal drainage, add guaifenesin 600 mg (Mucinex)  twice daily as needed with adequate hydration as discussed.  History of food allergy The patient's history suggests shellfish allergy, however food allergen skin tests were negative today despite a positive histamine control.  The negative predictive value of food allergy skin testing is excellent, however there is still 5% chance that the allergy exists.  Lab order has been provided for serum specific IgE against shellfish panel, beef, pork, and corn.  If lab work is negative, we will proceed with open graded oral challenge to shrimp.  Until food allergy has been definitively ruled out, she will continue to avoid these foods.  Asthma, mild intermittent  Albuterol HFA, 1-2 inhalations every 4-6 hours as needed.   When lab results have returned the patient will be called with further recommendations and follow up instructions.   Control of Dog or Cat Allergen  Avoidance is the best way to manage a dog or cat allergy. If you have a dog or cat and are allergic to dog or cats, consider removing the dog or cat from the home. If you have a dog or cat but don't want to find it a new home, or if your family wants a pet even though someone in the household is allergic, here are some strategies that may help keep symptoms at bay:  1. Keep the pet out of your bedroom and  restrict it to only a few rooms. Be advised that keeping the dog or cat in only one room will not limit the allergens to that room. 2. Don't pet, hug or kiss the dog or cat; if you do, wash your hands with soap and water. 3. High-efficiency particulate air (HEPA) cleaners run continuously in a bedroom or living room can reduce allergen levels over time. 4. Regular use of a high-efficiency vacuum cleaner or a central vacuum can reduce allergen levels. 5. Giving your dog or cat a bath at least once a week can reduce airborne allergen.

## 2016-07-10 NOTE — Assessment & Plan Note (Signed)
   Albuterol HFA, 1-2 inhalations every 4-6 hours as needed.

## 2016-07-11 ENCOUNTER — Telehealth: Payer: Self-pay

## 2016-07-11 LAB — ALLERGEN BEEF: Beef: <0.1 kU/L

## 2016-07-11 LAB — ALLERGY-SHELLFISH PANEL
Clams: <0.1 kU/L
Crab: <0.1 kU/L
Lobster: <0.1 kU/L
Shrimp IgE: <0.1 kU/L

## 2016-07-11 LAB — ALLERGEN, PORK, F26: Allergen, Pork, f26: <0.1 kU/L

## 2016-07-11 NOTE — Telephone Encounter (Signed)
Patient called and we spoke about her elevated blood pressure and recommended her to follow up with her PCP, per Dr. Nunzio CobbsBobbitt. Patient stated that she woulld folow up. Patient asked about her sprio that was done yesterday and wanted to find it if it improved after her neb treatment. I told her it did improve. Patient also asked about her lab results, I informed her that we have not received her results; as soon as we get them in we will call her back with the results.

## 2016-07-11 NOTE — Telephone Encounter (Signed)
Called patient and left message for her to call back. We need to inform patient that her blood pressure reading were both elevated, she needs to follow up with her PCP,per Dr. Nunzio CobbsBobbitt.

## 2016-07-14 LAB — ALLERGEN, CORN, IGG4: Allergen, Corn, IgG4: <0.15 ug/mL (ref <0.15)

## 2016-08-21 ENCOUNTER — Encounter: Payer: Medicare Other | Admitting: Allergy and Immunology

## 2016-09-25 ENCOUNTER — Encounter: Payer: Medicare Other | Admitting: Allergy and Immunology

## 2016-10-30 ENCOUNTER — Encounter: Payer: Self-pay | Admitting: Allergy and Immunology

## 2016-10-30 ENCOUNTER — Ambulatory Visit (INDEPENDENT_AMBULATORY_CARE_PROVIDER_SITE_OTHER): Payer: Medicare Other | Admitting: Allergy and Immunology

## 2016-10-30 VITALS — BP 162/88 | HR 88 | Resp 16 | Ht 61.0 in | Wt 109.0 lb

## 2016-10-30 DIAGNOSIS — J4520 Mild intermittent asthma, uncomplicated: Secondary | ICD-10-CM

## 2016-10-30 DIAGNOSIS — Z91018 Allergy to other foods: Secondary | ICD-10-CM | POA: Diagnosis not present

## 2016-10-30 DIAGNOSIS — J302 Other seasonal allergic rhinitis: Secondary | ICD-10-CM

## 2016-10-30 DIAGNOSIS — J3089 Other allergic rhinitis: Secondary | ICD-10-CM

## 2016-10-30 NOTE — Assessment & Plan Note (Signed)
The patient had negative in vivo and in vitro tests to shellfish and was able to tolerate the open graded oral challenge today without adverse signs or symptoms. Therefore, she has the same risk of systemic reaction associated with the consumption of shrimp as the general population.

## 2016-10-30 NOTE — Progress Notes (Signed)
Follow-up Note  RE: [name] MRN: [id number] DOB: 09/19/1945 Date of Office Visit: 10/30/2016  Primary care provider: Thayer HeadingsMackenzie, Brian, MD Referring provider: Thayer HeadingsMackenzie, Brian, MD  History of present illness: [name] is a 71 y.o. female with history of food allergy and allergic rhinitis presenting today for open graded oral challenge against shrimp.  Recall, she had a questionable reaction many years ago after consuming shrimp, involving facial edema.  She has not consume shellfish since that time.  She recently had negative skin tests to shellfish.  The negative predictive value for food allergen skin testing is excellent, however there is still 5% chance that the allergy exists.  Following that, lab work was drawn and also found to be negative to shellfish. She misses consuming shellfish and so is motivated to proceed with challenge today.   Assessment and plan: History of food allergy The patient had negative in vivo and in vitro tests to shellfish and was able to tolerate the open graded oral challenge today without adverse signs or symptoms. Therefore, she has the same risk of systemic reaction associated with the consumption of shrimp as the general population.   No orders of the defined types were placed in this encounter.   Diagnostics: Open graded shrimp oral challenge: The patient was able to tolerate the challenge today without adverse signs or symptoms. Vital signs were stable throughout the challenge and observation period.      Physical examination: Blood pressure (!) 162/88, pulse 88, resp. rate 16, height 5\' 1"  (1.549 m), weight 109 lb (49.4 kg), SpO2 97 %.  General: Alert, interactive, in no acute distress. HEENT: TMs pearly gray, turbinates mildly edematous without discharge, post-pharynx unremarkable. Neck: Supple without lymphadenopathy. Lungs: Clear to auscultation without wheezing, rhonchi or rales. CV: Normal S1, S2 without murmurs. Skin: Warm  and dry, without lesions or rashes.  The following portions of the patient's history were reviewed and updated as appropriate: allergies, current medications, past family history, past medical history, past social history, past surgical history and problem list.  Allergies as of 10/30/2016      Reactions   Shellfish Allergy Anaphylaxis   Codeine Nausea Only      Medication List       Accurate as of 10/30/16 12:52 PM. Always use your most recent med list.          bismuth subsalicylate 262 MG/15ML suspension Commonly known as:  PEPTO BISMOL Take 30 mLs by mouth at bedtime as needed.   calcium carbonate 500 MG chewable tablet Commonly known as:  TUMS - dosed in mg elemental calcium Chew 2 tablets by mouth at bedtime.   CALCIUM-VITAMIN D PO Take by mouth. 500/1000 1 tablet by mouth daily   fluticasone 50 MCG/ACT nasal spray Commonly known as:  FLONASE Place 2 sprays into both nostrils daily as needed for allergies or rhinitis.   loperamide 2 MG capsule Commonly known as:  IMODIUM Take 2 mg by mouth as needed for diarrhea or loose stools.   losartan 50 MG tablet Commonly known as:  COZAAR Take 1 tablet by mouth at bedtime.   multivitamin with minerals Tabs tablet Take 1 tablet by mouth daily.   pantoprazole 40 MG tablet Commonly known as:  PROTONIX Take 1 tablet by mouth  daily       Allergies  Allergen Reactions  . Shellfish Allergy Anaphylaxis  . Codeine Nausea Only    I appreciate the opportunity to take part in [name]'s care. Please do not  hesitate to contact me with questions.  Sincerely,   R. Edgar Frisk, MD

## 2016-10-30 NOTE — Patient Instructions (Signed)
History of food allergy The patient had negative in vivo and in vitro tests to shellfish and was able to tolerate the open graded oral challenge today without adverse signs or symptoms. Therefore, she has the same risk of systemic reaction associated with the consumption of shrimp as the general population.   Return in about 1 year (around 10/30/2017), or if symptoms worsen or fail to improve.

## 2017-02-07 ENCOUNTER — Telehealth: Payer: Self-pay | Admitting: Internal Medicine

## 2017-02-07 DIAGNOSIS — J302 Other seasonal allergic rhinitis: Secondary | ICD-10-CM

## 2017-02-07 DIAGNOSIS — J3089 Other allergic rhinitis: Principal | ICD-10-CM

## 2017-02-07 MED ORDER — FLUTICASONE PROPIONATE 50 MCG/ACT NA SUSP: 2.0000 | Freq: Every day | NASAL | 1 refills | Status: DC | PRN

## 2017-02-07 NOTE — Telephone Encounter (Signed)
Spoke with patient. She stated that she could not remember the name of the medication. Went through 2 years of OV notes and did not see any mentioning of inhalers. I did see a mention of Flonase. Patient believes this is the medication. RX has been called into pharmacy.

## 2017-02-09 ENCOUNTER — Ambulatory Visit: Payer: Medicare Other | Admitting: Internal Medicine

## 2017-02-13 ENCOUNTER — Other Ambulatory Visit: Payer: Self-pay | Admitting: Internal Medicine

## 2017-02-21 IMAGING — CT CT CHEST LIMITED W/O CM
2 of 3 series · 15 of 34 positions shown, 18 images · non-contrast
Comparison: None.

CLINICAL DATA: Fell, landing on left elbow 02/08/2016. Left
shoulder pain. Pain over medial left clavicle.

EXAM:
CT CHEST LIMITED WITHOUT CONTRAST
TECHNIQUE: Multidetector CT imaging of the chest was performed through the
lower neck and upper chest without IV contrast.

[Series 4: clavicle-soft · axial · 0.55mm/px · z∈[-88,-22]mm · 12 of 26 slices shown, 15 images]
[im 3/26  mediastinal]
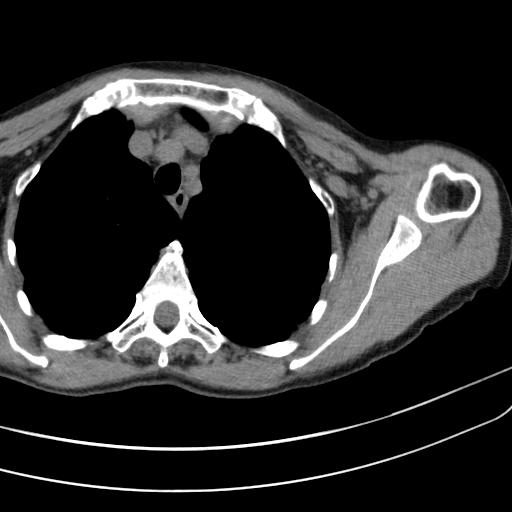
[im 3/26  lung]
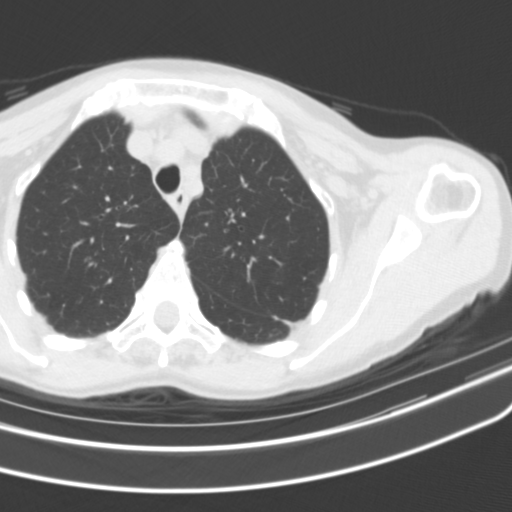
[im 5/26  lung]
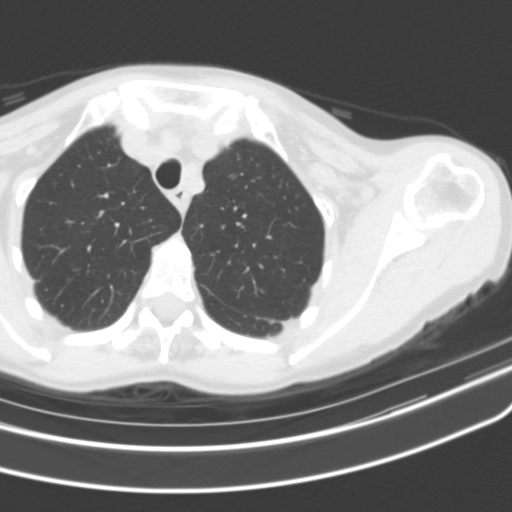
[im 7/26  lung]
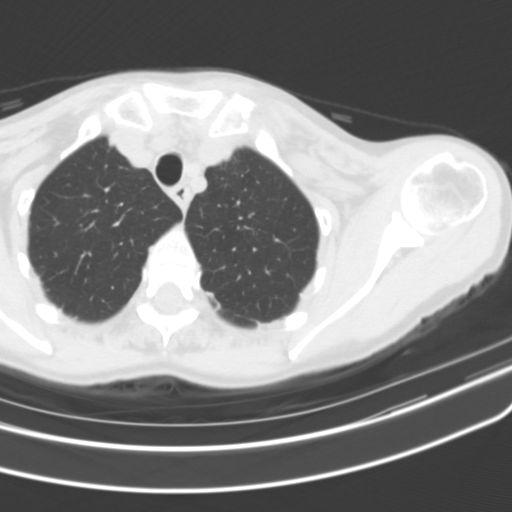
[im 9/26  lung]
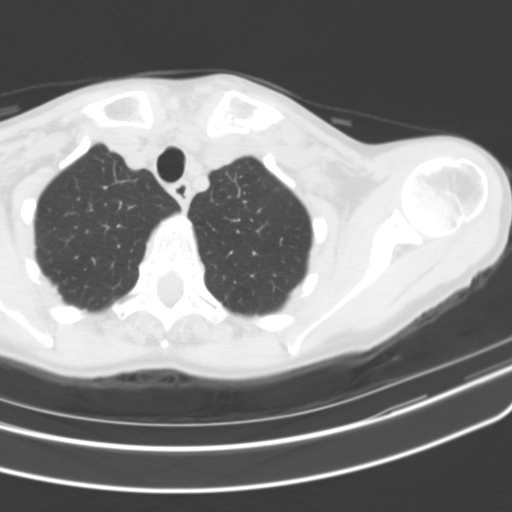
[im 11/26  mediastinal]
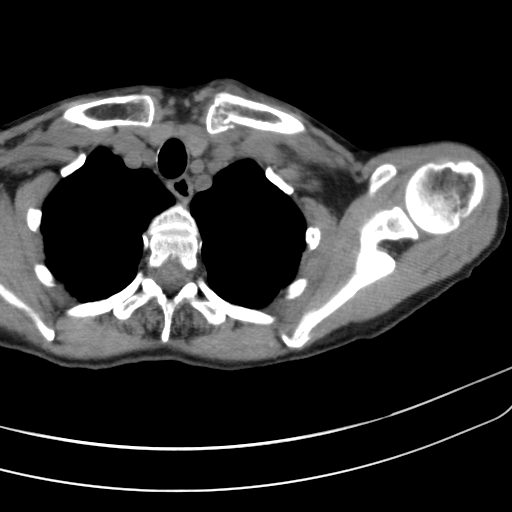
[im 11/26  lung]
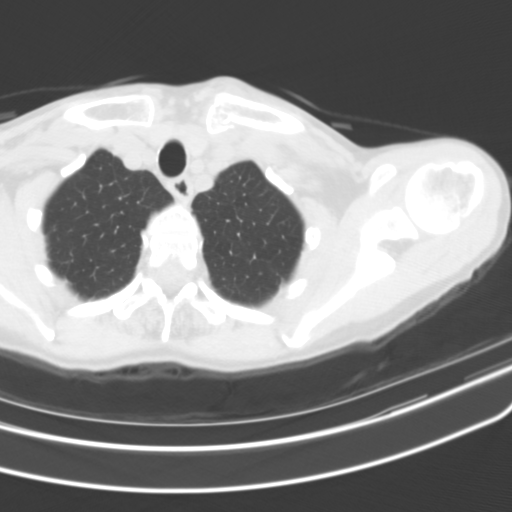
[im 13/26  lung]
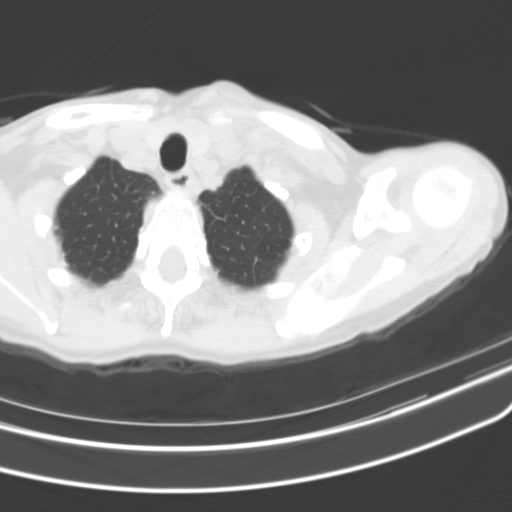
[im 15/26  lung]
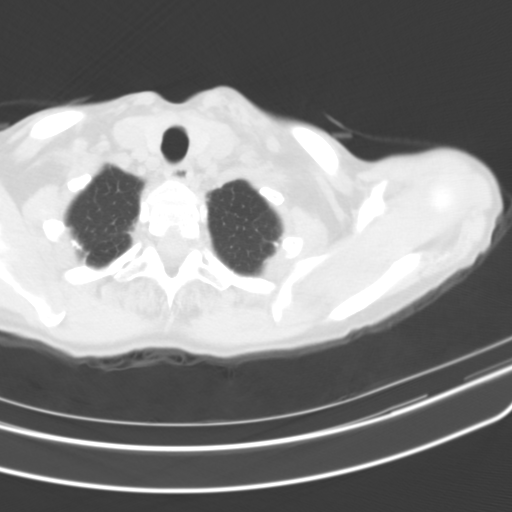
[im 17/26  lung]
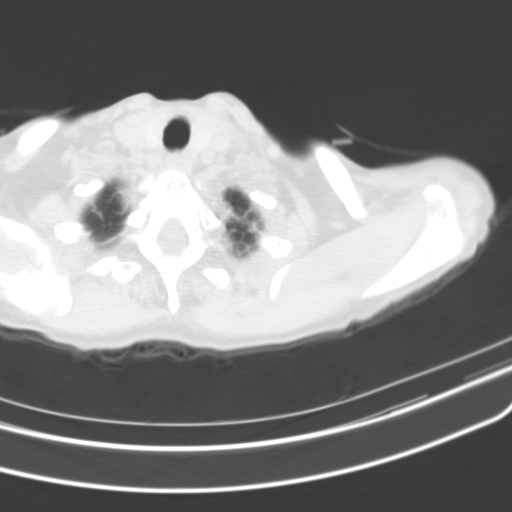
[im 19/26  mediastinal]
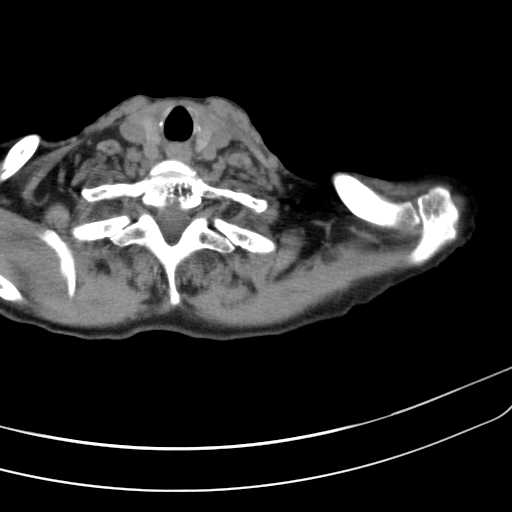
[im 19/26  lung]
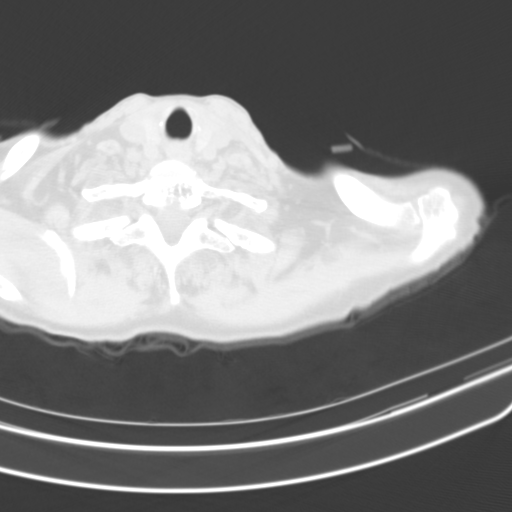
[im 21/26  lung]
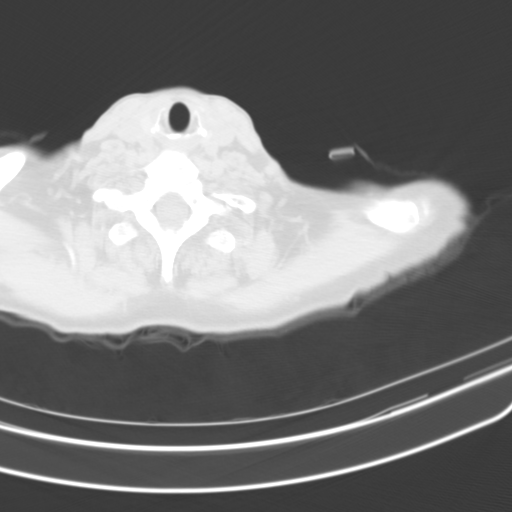
[im 23/26  lung]
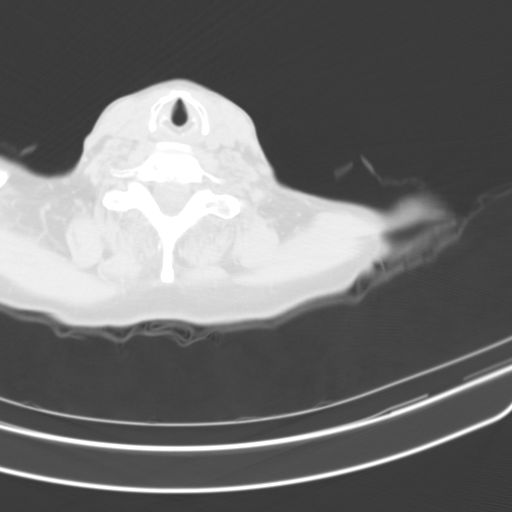
[im 25/26  lung]
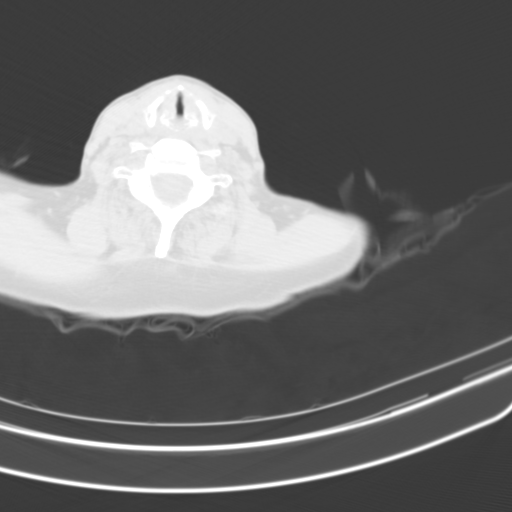

[Series 7: cor · coronal · 0.59mm/px · 3 of 56 slices shown]
[im 12/56  lung]
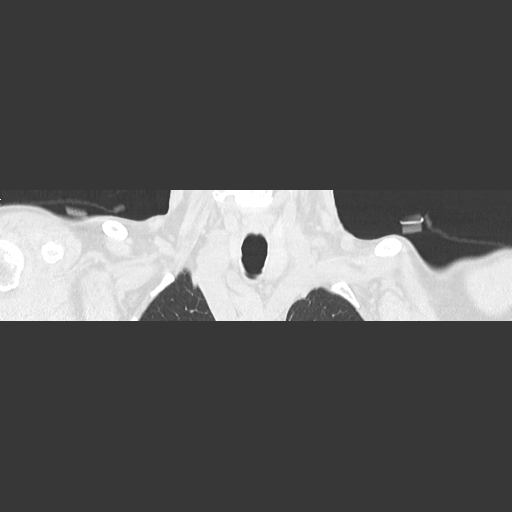
[im 23/56  lung]
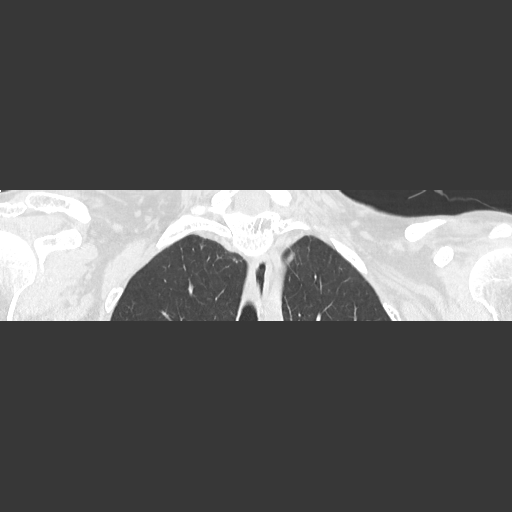
[im 34/56  lung]
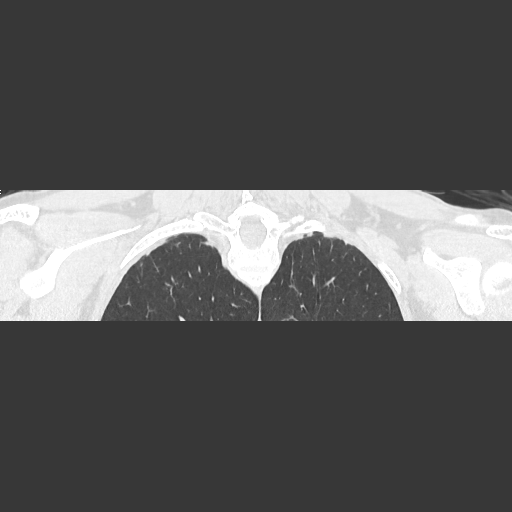

[15 of 34 positions shown; findings below may reference images not displayed]

FINDINGS: Cardiovascular: Visualize great vessels normal caliber. Arch is not
visualized.

Mediastinum/Nodes: No visible adenopathy in the supraclavicular
regions, upper axillae or upper mediastinum.

Lungs/Pleura: Mild biapical scarring. Visualized upper lungs
otherwise clear.

Musculoskeletal: There is a minimally displaced fracture through the
medial left clavicle in the region of the clavicular head. Fracture
also likely present through the anterior left first rib. No evidence
of SC joint subluxation or dislocation.
IMPRESSION: Fractures through the medial left clavicle and adjacent anterior
left first rib.

## 2017-03-15 ENCOUNTER — Encounter: Payer: Self-pay | Admitting: Internal Medicine

## 2017-03-15 ENCOUNTER — Ambulatory Visit (INDEPENDENT_AMBULATORY_CARE_PROVIDER_SITE_OTHER): Payer: Medicare Other | Admitting: Internal Medicine

## 2017-03-15 VITALS — BP 116/70 | HR 87 | Ht 62.0 in | Wt 112.0 lb

## 2017-03-15 DIAGNOSIS — G4733 Obstructive sleep apnea (adult) (pediatric): Secondary | ICD-10-CM | POA: Diagnosis not present

## 2017-03-15 DIAGNOSIS — J449 Chronic obstructive pulmonary disease, unspecified: Secondary | ICD-10-CM

## 2017-03-15 DIAGNOSIS — K219 Gastro-esophageal reflux disease without esophagitis: Secondary | ICD-10-CM | POA: Diagnosis not present

## 2017-03-15 MED ORDER — UMECLIDINIUM-VILANTEROL 62.5-25 MCG/INH IN AEPB: 1.0000 | INHALATION_SPRAY | Freq: Every day | RESPIRATORY_TRACT | 0 refills | Status: DC

## 2017-03-15 NOTE — Assessment & Plan Note (Signed)
Importance of reflux precautions emphasized. I asked her to elevate head of bed on brick

## 2017-03-15 NOTE — Progress Notes (Signed)
HPI female never smoker followed for Allergic rhinitis, asthma, complicated by ulcerative colitis PFT 2011 mild obstruction with air trapping PFT 06/21/16-moderate obstructive airways disease insignificant response to bronchodilator diffusion mildly reduced. FVC 1.97/71%, FEV1 1.41/68%, ratio 0.72, TLC 99%, DLCO 75% -----------------------------------------------------.  06/07/2016-71 year old female never smoker followed for Allergic rhinitis, Asthma, complicated by ulcerative colitis Allergy Vaccine 1:10 ended 05/28/2016 at the end of our program at this office FOLLOWS FOR: Pt has upcoming appt with Dr Kathyrn Lass group 06-20-16. Pt continues to have cough No recent obvious infection but started coughing as cold weather began this December-scant white but can cough until she retches. No wheeze or chest pain. No inhalers. Larey Seat last month with fracture clavicle. Continues Protonix for GERD, usually fair control. Bothersome postnasal drip, nonpurulent. Continues yoga with aromatherapy and essential oils. Does her own yard work outdoors.  03/15/17- 71 year old female never smoker followed for Allergic rhinitis, Asthma, complicated by ulcerative colitis, GERD Asthma; Pt has increase in cough-mainly in the middle of the night. NP at [facility] thinks its related to sleep issues. Unsure if she snores. Widowed and living alone. She denies change in cough and says she only uses that complaint as a way to get in to be seen for this visit. She really wanted to focus on the sleep issue. Has been feeling unrested for over a year. Caffeine in the morning only. No sleep medicines. Frequent waking after sleep onset and it can be hard to get back to sleep. Not physically uncomfortable. ENT surgery-tonsils. Admits reflux. Epworth 18/ 0 CT chest 06/21/16- IMPRESSION: 5 mm LEFT lower lobe pulmonary nodule. No follow-up needed if patient is low-risk. Non-contrast chest CT can be considered in 12 months if patient is  high-risk. This recommendation follows the consensus statement: Guidelines for Management of Incidental Pulmonary Nodules Detected on CT Images: From the Fleischner Society 2017; Radiology 2017; 284:228-243. PFT 06/21/16-moderate obstructive airways disease insignificant response to bronchodilator diffusion mildly reduced. FVC 1.97/71%, FEV1 1.41/68%, ratio 0.72, TLC 99%, DLCO 75%  ROS-see HPI  "+" = pos Constitutional:   No-   weight loss, night sweats, fevers, chills, + fatigue, lassitude. HEENT:   No-  headaches, difficulty swallowing, tooth/dental problems, sore throat,       No-  sneezing, itching, ear ache, +nasal congestion, +post nasal drip,  CV:  No-   chest pain, orthopnea, PND, swelling in lower extremities, anasarca,  dizziness, palpitations Resp: No-   shortness of breath with exertion or at rest.              +   productive cough,  No non-productive cough,  No- coughing up of blood.              No-   change in color of mucus.  No- wheezing.   Skin: No-   rash or lesions. GI:  + heartburn, indigestion, abdominal pain, nausea, vomiting,  GU:  MS:  No-   joint pain or swelling.   Neuro-     nothing unusual Psych:  No- change in mood or affect. No depression or anxiety.  No memory loss.  OBJ- Physical Exam General- Alert, Oriented, Affect-appropriate, Distress- none acute Skin- rash-none, lesions- none, excoriation- none Lymphadenopathy- none Head- atraumatic            Eyes- Gross vision intact, PERRLA, conjunctivae and secretions clear            Ears- Hearing, canals-normal            Nose-  turbinate  edema, +Septal dev, no-mucus, polyps,  erosions             Throat- Mallampati IV , mucosa clear , drainage- none, tonsils- atrophic Neck- flexible , trachea midline, no stridor , thyroid nl, carotid no bruit Chest - symmetrical excursion , unlabored           Heart/CV- RRR , no murmur , no gallop  , no rub, nl s1 s2                           - JVD- none , edema- none,  stasis changes- none, varices- none           Lung- clear to P&A, wheeze- none, cough-none , dullness-none, rub- none           Chest wall-  Abd-  Br/ Gen/ Rectal- Not done, not indicated Extrem- cyanosis- none, clubbing, none, atrophy- none, strength- nl Neuro- grossly intact to observation

## 2017-03-15 NOTE — Patient Instructions (Addendum)
Order- schedule unattended home sleep test    Dx OSA  Please call me about 2 weeks after your sleep study, for results and recommendation. If appropriate, we could go ahead and start treatment then.  Sample Anoro     Flu vaccine Senior

## 2017-03-15 NOTE — Assessment & Plan Note (Signed)
Tentative diagnosis based on history and physical exam. We discussed this problem, associated medical concerns, responsibility to be alert while driving and common treatments. Plan-schedule sleep study. She will call for report and recommendations.

## 2017-03-15 NOTE — Assessment & Plan Note (Signed)
Some progression of obstructive physiology. Plan- sample Anoro, flu vaccine

## 2017-03-23 ENCOUNTER — Telehealth: Payer: Self-pay | Admitting: Internal Medicine

## 2017-03-23 MED ORDER — UMECLIDINIUM-VILANTEROL 62.5-25 MCG/INH IN AEPB: 1.0000 | INHALATION_SPRAY | Freq: Every day | RESPIRATORY_TRACT | 5 refills | Status: DC

## 2017-03-23 NOTE — Telephone Encounter (Signed)
Spoke with patient. Advised her that I sent in RX for Anoro. She verbalized understanding. Nothing else needed at time of call.

## 2017-04-04 DIAGNOSIS — G4733 Obstructive sleep apnea (adult) (pediatric): Secondary | ICD-10-CM | POA: Diagnosis not present

## 2017-04-05 DIAGNOSIS — G4733 Obstructive sleep apnea (adult) (pediatric): Secondary | ICD-10-CM | POA: Diagnosis not present

## 2017-04-13 ENCOUNTER — Institutional Professional Consult (permissible substitution): Payer: Medicare Other | Admitting: Pulmonary Disease

## 2017-04-18 ENCOUNTER — Other Ambulatory Visit: Payer: Self-pay | Admitting: *Deleted

## 2017-04-18 DIAGNOSIS — G4733 Obstructive sleep apnea (adult) (pediatric): Secondary | ICD-10-CM

## 2017-04-25 ENCOUNTER — Telehealth: Payer: Self-pay | Admitting: Internal Medicine

## 2017-04-25 DIAGNOSIS — G4733 Obstructive sleep apnea (adult) (pediatric): Secondary | ICD-10-CM

## 2017-04-25 NOTE — Telephone Encounter (Signed)
CY can you review results? I see a home sleep test scanned in Epic.

## 2017-04-26 NOTE — Telephone Encounter (Signed)
Called pt letting her know the results of her sleep study. Pt expressed understanding.  Placed order for pt to be started on CPAP. Nothing further needed.

## 2017-04-26 NOTE — Telephone Encounter (Signed)
Patient returning call - attempted to call triage -no answer - when I came back to the phone she was gone -pr

## 2017-04-26 NOTE — Telephone Encounter (Signed)
Her home sleep test showed obstructive sleep apnea, averaging 13 apneas/ hour with drops in blood oxygen level. I think the best treatment for her would be CPAP. If she agrees, please  Order new DME, new CPAP auto 5-15, mask of choice, humidifier, supplies, AirView   Dx OSA  Please make sure she has a return ov in 31-90 days per insurance regs.

## 2017-04-26 NOTE — Telephone Encounter (Signed)
Left message for patient to call back  

## 2017-05-17 ENCOUNTER — Other Ambulatory Visit: Payer: Self-pay | Admitting: Internal Medicine

## 2017-06-21 ENCOUNTER — Ambulatory Visit: Payer: Medicare Other | Admitting: Internal Medicine

## 2017-07-08 ENCOUNTER — Other Ambulatory Visit: Payer: Self-pay | Admitting: Internal Medicine

## 2017-08-12 ENCOUNTER — Ambulatory Visit (HOSPITAL_COMMUNITY)
Admission: EM | Admit: 2017-08-12 | Discharge: 2017-08-12 | Disposition: A | Discharge status: Home / Self Care | Payer: Medicare Other

## 2017-08-12 ENCOUNTER — Encounter (HOSPITAL_COMMUNITY): Payer: Self-pay | Admitting: Emergency Medicine

## 2017-08-12 DIAGNOSIS — S0081XA Abrasion of other part of head, initial encounter: Secondary | ICD-10-CM | POA: Diagnosis not present

## 2017-08-12 NOTE — ED Triage Notes (Signed)
Pt also scraped her L knee and her pinkie finger.

## 2017-08-12 NOTE — ED Triage Notes (Signed)
Pt states "I tripped over a speed bump in the parking lot today and I was wearing different glasses and they pushed the wire rim into my eyebrow". Pt has small lac to L upper eyebrow. No bleeding at this time.

## 2017-08-12 NOTE — ED Provider Notes (Signed)
08/12/2017 3:41 PM   DOB: 10/12/1945 / MRN: [id number]  SUBJECTIVE:  [name] is a 72 y.o. female presenting for some scratches about the left lateral orbit.  States she was walking forward and turning around to look at her friend and tripped and landed on her face.  Tells me "there was not a lot of bleeding."  She is allergic to codeine.    Review of Systems  Eyes: Negative for blurred vision, double vision, photophobia, pain, discharge and redness.  Endo/Heme/Allergies: Does not bruise/bleed easily.    OBJECTIVE:  BP (!) 154/89   Pulse 92   Temp 98.7 F (37.1 C)   Resp 16   SpO2 100%   Physical Exam  Constitutional: She is active.  Non-toxic appearance.  HENT:  Head:    Right Ear: Hearing, tympanic membrane, external ear and ear canal normal.  Left Ear: Hearing, tympanic membrane, external ear and ear canal normal.  Nose: Nose normal. Right sinus exhibits no maxillary sinus tenderness and no frontal sinus tenderness. Left sinus exhibits no maxillary sinus tenderness and no frontal sinus tenderness.  Mouth/Throat: Uvula is midline, oropharynx is clear and moist and mucous membranes are normal. Mucous membranes are not dry. No oropharyngeal exudate, posterior oropharyngeal edema or tonsillar abscesses.  Cardiovascular: Normal rate.  Pulmonary/Chest: Effort normal. No tachypnea.  Lymphadenopathy:       Head (right side): No submandibular and no tonsillar adenopathy present.       Head (left side): No submandibular and no tonsillar adenopathy present.    She has no cervical adenopathy.  Neurological: She is alert.  Skin: Skin is warm and dry. She is not diaphoretic. No pallor.    No results found for this or any previous visit (from the past 72 hour(s)).  No results found.  ASSESSMENT AND PLAN:  No orders of the defined types were placed in this encounter.    Abrasion, face w/o infection: Repair not necessary.  I have given her some bacitracin packs and advised  that she apply to the wound often.      The patient is advised to call or return to clinic if she does not see an improvement in symptoms, or to seek the care of the closest emergency department if she worsens with the above plan.   Deliah BostonMichael Quanell Loughney, MHS, PA-C 08/12/2017 3:41 PM    Ofilia Neaslark, Jamilia Jacques L, PA-C 08/12/17 1541

## 2017-08-12 NOTE — Discharge Instructions (Signed)
Keep the wound dry for the next 24 hours.  Apply the ointment that I have given you in the clinic often.  Come back if you have any problems.

## 2017-08-27 ENCOUNTER — Ambulatory Visit (INDEPENDENT_AMBULATORY_CARE_PROVIDER_SITE_OTHER)
Admission: RE | Admit: 2017-08-27 | Discharge: 2017-08-27 | Disposition: A | Discharge status: Home / Self Care | Payer: Medicare Other | Source: Ambulatory Visit | Attending: Internal Medicine | Admitting: Internal Medicine

## 2017-08-27 ENCOUNTER — Ambulatory Visit (INDEPENDENT_AMBULATORY_CARE_PROVIDER_SITE_OTHER): Payer: Medicare Other | Admitting: Internal Medicine

## 2017-08-27 ENCOUNTER — Encounter: Payer: Self-pay | Admitting: Internal Medicine

## 2017-08-27 VITALS — BP 114/76 | HR 77 | Ht 62.0 in | Wt 113.2 lb

## 2017-08-27 DIAGNOSIS — R911 Solitary pulmonary nodule: Secondary | ICD-10-CM

## 2017-08-27 DIAGNOSIS — J449 Chronic obstructive pulmonary disease, unspecified: Secondary | ICD-10-CM | POA: Diagnosis not present

## 2017-08-27 DIAGNOSIS — G4733 Obstructive sleep apnea (adult) (pediatric): Secondary | ICD-10-CM | POA: Diagnosis not present

## 2017-08-27 NOTE — Assessment & Plan Note (Signed)
Download confirms her impression that she is doing well with CPAP.  She would like to work some more on mask fit. Plan-we will continue CPAP auto 5-15. Schedule mask fitting

## 2017-08-27 NOTE — Assessment & Plan Note (Signed)
We will want to look again at airflow in the future since her score on 06/21/16 was lower than anticipated.  For now Anoro does well.

## 2017-08-27 NOTE — Patient Instructions (Signed)
Order- DME [facility]  Please replace supplies, continue CPAP auto 5-15, mask of choice, humidifier, Airview  [facility]  Order-- CXR    Dx lung nodule  We can continue Anoro and Flonase

## 2017-08-27 NOTE — Assessment & Plan Note (Signed)
Low risk. Plan-CXR

## 2017-08-27 NOTE — Progress Notes (Signed)
HPI female never smoker followed for Allergic rhinitis, asthma, complicated by ulcerative colitis PFT 2011 mild obstruction with air trapping PFT 06/21/16-moderate obstructive airways disease insignificant response to bronchodilator diffusion mildly reduced. FVC 1.97/71%, FEV1 1.41/68%, ratio 0.72, TLC 99%, DLCO 75% HST-04/04/17-AHI 13.5/hour, desaturation to 73%, body weight 112 pounds  -----------------------------------------------------.  03/15/17- 72 year old female never smoker followed for Allergic rhinitis, Asthma, complicated by ulcerative colitis, GERD Asthma; Pt has increase in cough-mainly in the middle of the night. [facility] thinks its related to sleep issues. Unsure if she snores. Widowed and living alone. She denies change in cough and says she only uses that complaint as a way to get in to be seen for this visit. She really wanted to focus on the sleep issue. Has been feeling unrested for over a year. Caffeine in the morning only. No sleep medicines. Frequent waking after sleep onset and it can be hard to get back to sleep. Not physically uncomfortable. ENT surgery-tonsils. Admits reflux. Epworth 18/ 0 CT chest 06/21/16- IMPRESSION: 5 mm LEFT lower lobe pulmonary nodule. No follow-up needed if patient is low-risk. Non-contrast chest CT can be considered in 12 months if patient is high-risk. This recommendation follows the consensus statement: Guidelines for Management of Incidental Pulmonary Nodules Detected on CT Images: From the Fleischner Society 2017; Radiology 2017; 284:228-243. PFT 06/21/16-moderate obstructive airways disease insignificant response to bronchodilator diffusion mildly reduced. FVC 1.97/71%, FEV1 1.41/68%, ratio 0.72, TLC 99%, DLCO 75%  08/27/17- 72 year old female never smoker followed for Allergic rhinitis, Asthma, complicated by ulcerative colitis, GERD HST-04/04/17-AHI 13.5/hour, desaturation to 73%, body weight 112 pounds CPAP  5-15/ [facility]  started 04/26/17 ----OSA;DME: [facility]. Pt wears CPAP nightly and DL attached. Pt will need order for new supplies.  Anoro, Flonase She is getting used to CPAP and thinks she is feeling better rested, but some annoying mask leak. Download 100% compliance AHI 2.2/hour Anoro does seem to help her morning cough and she feels she is breathing well. We discussed previous CT scan/lung nodule.  She is low risk- we agreed on CXR.  ROS-see HPI  "+" = pos Constitutional:   No-   weight loss, night sweats, fevers, chills, + fatigue, lassitude. HEENT:   No-  headaches, difficulty swallowing, tooth/dental problems, sore throat,       No-  sneezing, itching, ear ache, +nasal congestion, +post nasal drip,  CV:  No-   chest pain, orthopnea, PND, swelling in lower extremities, anasarca,  dizziness, palpitations Resp: No-   shortness of breath with exertion or at rest.              +   productive cough,  No non-productive cough,  No- coughing up of blood.              No-   change in color of mucus.  No- wheezing.   Skin: No-   rash or lesions. GI:  + heartburn, indigestion, abdominal pain, nausea, vomiting,  GU:  MS:  No-   joint pain or swelling.   Neuro-     nothing unusual Psych:  No- change in mood or affect. No depression or anxiety.  No memory loss.  OBJ- Physical Exam General- Alert, Oriented, Affect-appropriate, Distress- none acute, + trim and alert Skin- rash-none, lesions- none, excoriation- none Lymphadenopathy- none Head- atraumatic            Eyes- Gross vision intact, PERRLA, conjunctivae and secretions clear            Ears-  Hearing, canals-normal            Nose-  turbinate edema, +Septal dev, no-mucus, polyps,  erosions             Throat- Mallampati IV , mucosa clear , drainage- none, tonsils- atrophic Neck- flexible , trachea midline, no stridor , thyroid nl, carotid no bruit Chest - symmetrical excursion , unlabored           Heart/CV- RRR , no murmur , no gallop  , no rub, nl  s1 s2                           - JVD- none , edema- none, stasis changes- none, varices- none           Lung- clear to P&A, wheeze- none, cough-none , dullness-none, rub- none           Chest wall-  Abd-  Br/ Gen/ Rectal- Not done, not indicated Extrem- cyanosis- none, clubbing, none, atrophy- none, strength- nl Neuro- grossly intact to observation

## 2017-10-01 ENCOUNTER — Other Ambulatory Visit (HOSPITAL_BASED_OUTPATIENT_CLINIC_OR_DEPARTMENT_OTHER): Payer: Medicare Other

## 2017-10-24 ENCOUNTER — Other Ambulatory Visit (HOSPITAL_BASED_OUTPATIENT_CLINIC_OR_DEPARTMENT_OTHER): Payer: Medicare Other | Admitting: Radiology

## 2017-10-28 ENCOUNTER — Other Ambulatory Visit: Payer: Self-pay | Admitting: Internal Medicine

## 2017-10-28 DIAGNOSIS — J302 Other seasonal allergic rhinitis: Secondary | ICD-10-CM

## 2017-10-28 DIAGNOSIS — J3089 Other allergic rhinitis: Principal | ICD-10-CM

## 2018-03-04 ENCOUNTER — Ambulatory Visit (INDEPENDENT_AMBULATORY_CARE_PROVIDER_SITE_OTHER): Payer: Medicare Other | Admitting: Internal Medicine

## 2018-03-04 ENCOUNTER — Encounter: Payer: Self-pay | Admitting: Internal Medicine

## 2018-03-04 DIAGNOSIS — J302 Other seasonal allergic rhinitis: Secondary | ICD-10-CM

## 2018-03-04 DIAGNOSIS — J4520 Mild intermittent asthma, uncomplicated: Secondary | ICD-10-CM | POA: Diagnosis not present

## 2018-03-04 DIAGNOSIS — J3089 Other allergic rhinitis: Secondary | ICD-10-CM

## 2018-03-04 DIAGNOSIS — G4733 Obstructive sleep apnea (adult) (pediatric): Secondary | ICD-10-CM

## 2018-03-04 DIAGNOSIS — Z23 Encounter for immunization: Secondary | ICD-10-CM | POA: Diagnosis not present

## 2018-03-04 MED ORDER — FLUTICASONE PROPIONATE 50 MCG/ACT NA SUSP: 2.0000 | Freq: Every day | NASAL | 12 refills | Status: DC | PRN

## 2018-03-04 NOTE — Assessment & Plan Note (Signed)
Seasonal exacerbation. Plan-discussed humidifier for CPAP, Flonase, saline nasal spray

## 2018-03-04 NOTE — Addendum Note (Signed)
Addended by: Boone Master E on: 03/04/2018 03:43 PM   Modules accepted: Orders

## 2018-03-04 NOTE — Patient Instructions (Addendum)
Script sent for flonase  Order- DME [facility]   Please reduce CPAP auto range to 5-10, continue mask of choice, humidifier, supplies, AirView  Order- flu vax senior  Please call if we can help

## 2018-03-04 NOTE — Progress Notes (Signed)
HPI female never smoker followed for OSA,Allergic rhinitis, asthma, complicated by ulcerative colitis PFT 2011 mild obstruction with air trapping PFT 06/21/16-moderate obstructive airways disease insignificant response to bronchodilator diffusion mildly reduced. FVC 1.97/71%, FEV1 1.41/68%, ratio 0.72, TLC 99%, DLCO 75% HST-04/04/17-AHI 13.5/hour, desaturation to 73%, body weight 112 pounds CT chest 06/21/16-5 mm left lower lobe pulmonary nodule- low risk -----------------------------------------------------. 08/27/17- 72 year old female never smoker followed for OSA, Allergic rhinitis, Asthma, fatigue, complicated by ulcerative colitis, GERD HST-04/04/17-AHI 13.5/hour, desaturation to 73%, body weight 112 pounds CPAP  5-15/ [facility] started 04/26/17 ----OSA;DME: [facility]. Pt wears CPAP nightly and DL attached. Pt will need order for new supplies.  Anoro, Flonase She is getting used to CPAP and thinks she is feeling better rested, but some annoying mask leak. Download 100% compliance AHI 2.2/hour Anoro does seem to help her morning cough and she feels she is breathing well. We discussed previous CT scan/lung nodule.  She is low risk- we agreed on CXR.  03/04/2018- 72 year old female never smoker followed for OSA, Allergic rhinitis, Asthma, fatigue, complicated by ulcerative colitis, GERD -----FOLLOWS FOR: 6 month OSA follow up.  reports machine, pressure settings and mask fit are doing well.  does report some increased nasal congestion with the weather change. Anoro, flonase,  CPAP 5-15/[facility] >> today change to auto 5-10 Download 100% compliance AHI 2.3/hour.  She is comfortable with CPAP.  We discussed her ability to adjust humidifier. Some seasonal stuffiness and head and ears.  Asks refill Flonase and we discussed use of nasal saline.  She does not want to use Sudafed.  Denies chest tightness, wheezing, asthma. CXR 08/27/2017- IMPRESSION: No active cardiopulmonary disease. The known left lower  lobe nodule is not well appreciated on this exam.  ROS-see HPI  "+" = positive Constitutional:   No-   weight loss, night sweats, fevers, chills, + fatigue, lassitude. HEENT:   No-  headaches, difficulty swallowing, tooth/dental problems, sore throat,       No-  sneezing, itching, ear ache, +nasal congestion, +post nasal drip,  CV:  No-   chest pain, orthopnea, PND, swelling in lower extremities, anasarca,  dizziness, palpitations Resp: No-   shortness of breath with exertion or at rest.              +   productive cough,  No non-productive cough,  No- coughing up of blood.              No-   change in color of mucus.  No- wheezing.   Skin: No-   rash or lesions. GI:  + heartburn, indigestion, abdominal pain, nausea, vomiting,  GU:  MS:  No-   joint pain or swelling.   Neuro-     nothing unusual Psych:  No- change in mood or affect. No depression or anxiety.  No memory loss.  OBJ- Physical Exam General- Alert, Oriented, Affect-appropriate, Distress- none acute, + trim and alert Skin- rash-none, lesions- none, excoriation- none Lymphadenopathy- none Head- atraumatic            Eyes- Gross vision intact, PERRLA, conjunctivae and secretions clear            Ears- Hearing, canals-normal            Nose-  +Mild turbinate edema, +Septal dev, no-mucus, polyps,  erosions             Throat- Mallampati IV , mucosa clear , drainage- none, tonsils- atrophic Neck- flexible , trachea midline, no stridor , thyroid nl, carotid  no bruit Chest - symmetrical excursion , unlabored           Heart/CV- RRR , no murmur , no gallop  , no rub, nl s1 s2                           - JVD- none , edema- none, stasis changes- none, varices- none           Lung- clear to P&A, wheeze- none, cough-none , dullness-none, rub- none           Chest wall-  Abd-  Br/ Gen/ Rectal- Not done, not indicated Extrem- cyanosis- none, clubbing, none, atrophy- none, strength- nl Neuro- grossly intact to observation

## 2018-03-04 NOTE — Assessment & Plan Note (Signed)
Benefits from CPAP with improved sleep.  Download confirms excellent compliance and control.  At times pressure seems a little high. Plan-reduce AutoPap range to 5-10 and teach use of humidifier.

## 2018-03-04 NOTE — Assessment & Plan Note (Signed)
She feels comfortable and well controlled.  Exam confirms. Plan-no changes.  Flu vaccine today.

## 2018-05-07 ENCOUNTER — Telehealth: Payer: Self-pay | Admitting: Internal Medicine

## 2018-05-07 DIAGNOSIS — G4733 Obstructive sleep apnea (adult) (pediatric): Secondary | ICD-10-CM

## 2018-05-07 NOTE — Telephone Encounter (Signed)
Called and spoke with patient, she stated that her CPAP setting is at a 6 and that is too strong for her. She would like this to be cut back. CY please advise, thank you.

## 2018-05-08 NOTE — Telephone Encounter (Signed)
Patient returned phone call; pt contact # [phone]

## 2018-05-08 NOTE — Telephone Encounter (Signed)
Called and spoke with patient she is aware and verbalized understanding nothing further needed.  

## 2018-05-08 NOTE — Telephone Encounter (Signed)
Please order her DME- please reduce CPAP to 5 cwp

## 2018-05-08 NOTE — Telephone Encounter (Addendum)
ATC pt, no answer. Left message for pt to call back.  Order placed.  

## 2018-06-06 ENCOUNTER — Ambulatory Visit (INDEPENDENT_AMBULATORY_CARE_PROVIDER_SITE_OTHER): Payer: Medicare Other | Admitting: Internal Medicine

## 2018-06-06 ENCOUNTER — Encounter: Payer: Self-pay | Admitting: Internal Medicine

## 2018-06-06 DIAGNOSIS — J449 Chronic obstructive pulmonary disease, unspecified: Secondary | ICD-10-CM | POA: Diagnosis not present

## 2018-06-06 DIAGNOSIS — G4733 Obstructive sleep apnea (adult) (pediatric): Secondary | ICD-10-CM | POA: Diagnosis not present

## 2018-06-06 NOTE — Progress Notes (Signed)
HPI female never smoker followed for OSA,Allergic rhinitis, asthma, complicated by ulcerative colitis PFT 2011 mild obstruction with air trapping PFT 06/21/16-moderate obstructive airways disease insignificant response to bronchodilator diffusion mildly reduced. FVC 1.97/71%, FEV1 1.41/68%, ratio 0.72, TLC 99%, DLCO 75% HST-04/04/17-AHI 13.5/hour, desaturation to 73%, body weight 112 pounds CT chest 06/21/16-5 mm left lower lobe pulmonary nodule- low risk -----------------------------------------------------.  03/04/2018- 73 year old female never smoker followed for OSA, Allergic rhinitis, Asthma, fatigue, complicated by ulcerative colitis, GERD -----FOLLOWS FOR: 6 month OSA follow up.  reports machine, pressure settings and mask fit are doing well.  does report some increased nasal congestion with the weather change. Anoro, flonase,  CPAP 5-15/[facility] >> today change to auto 5-10 Download 100% compliance AHI 2.3/hour.  She is comfortable with CPAP.  We discussed her ability to adjust humidifier. Some seasonal stuffiness and head and ears.  Asks refill Flonase and we discussed use of nasal saline.  She does not want to use Sudafed.  Denies chest tightness, wheezing, asthma. CXR 08/27/2017- IMPRESSION: No active cardiopulmonary disease. The known left lower lobe nodule is not well appreciated on this exam.  06/06/2018- 73 year old female never smoker followed for OSA, Allergic rhinitis, Asthma, LLL nodule, fatigue, complicated by ulcerative colitis, GERD CPAP auto 5-10/[facility] Download 86.7% compliance AHI 4.2/hour Anoro, Flonase -----Asthma: Pt had sinus infection around Thanksgiving-Zpak cleared up and recently had PNA. Would like to know about soreness in chest. Was treated with levaquin- dose reduced to 250 mg for tolerance. She brings disk from 05/27/18- I reviewed and don't identify an infiltrate.  She was treated for pneumonia, apparently clinical diagnosis.  Still some soreness across  anterior upper chest with deep breath. Bled from left ear after earwax flush at PCP yesterday.  ROS-see HPI  "+" = positive Constitutional:   No-   weight loss, night sweats, fevers, chills, + fatigue, lassitude. HEENT:   No-  headaches, difficulty swallowing, tooth/dental problems, sore throat,       No-  sneezing, itching, ear ache, +nasal congestion, +post nasal drip,  CV:  + chest pain, orthopnea, PND, swelling in lower extremities, anasarca,  dizziness, palpitations Resp: No-   shortness of breath with exertion or at rest.                 productive cough,  No non-productive cough,  No- coughing up of blood.              No-   change in color of mucus.  No- wheezing.   Skin: No-   rash or lesions. GI:  + heartburn, indigestion, abdominal pain, nausea, vomiting,  GU:  MS:  No-   joint pain or swelling.   Neuro-     nothing unusual Psych:  No- change in mood or affect. No depression or anxiety.  No memory loss.  OBJ- Physical Exam General- Alert, Oriented, Affect-appropriate, Distress- none acute, + trim and alert Skin- rash-none, lesions- none, excoriation- none Lymphadenopathy- none Head- atraumatic            Eyes- Gross vision intact, PERRLA, conjunctivae and secretions clear            Ears- Hearing, canals-normal            Nose-  +Mild turbinate edema, +Septal dev, no-mucus, polyps,  erosions             Throat- Mallampati IV , mucosa clear , drainage- none, tonsils- atrophic Neck- flexible , trachea midline, no stridor , thyroid nl, carotid no  bruit Chest - symmetrical excursion , unlabored           Heart/CV- RRR , no murmur , no gallop  , no rub, nl s1 s2                           - JVD- none , edema- none, stasis changes- none, varices- none           Lung-+ coarse sounds left suprascapular area, wheeze- none, cough-none , dullness-none, rub- none           Chest wall-  Abd-  Br/ Gen/ Rectal- Not done, not indicated Extrem- cyanosis- none, clubbing, none, atrophy-  none, strength- nl Neuro- grossly intact to observation

## 2018-06-06 NOTE — Patient Instructions (Signed)
Glad you are feeling better. We mentioned otc Mucinex-DM, heating pad, warm tea and any other comfort measures you want to try.  You are doing well with CPAP and coan continue.  We will see you as planned in April  Please call if we can help

## 2018-06-07 NOTE — Assessment & Plan Note (Signed)
Minor residual symptoms after recent exacerbation.  She had at least a bronchitis and was treated with Levaquin.  I think she will clear on her own now but she understands to call if symptoms relapse.

## 2018-06-07 NOTE — Assessment & Plan Note (Signed)
She continues to benefit from CPAP and download confirms good compliance and control.  No changes are needed.

## 2018-06-08 ENCOUNTER — Encounter (HOSPITAL_COMMUNITY): Payer: Self-pay | Admitting: *Deleted

## 2018-06-08 ENCOUNTER — Ambulatory Visit (HOSPITAL_COMMUNITY)
Admission: EM | Admit: 2018-06-08 | Discharge: 2018-06-08 | Disposition: A | Discharge status: Home / Self Care | Payer: Medicare Other | Attending: Family Medicine | Admitting: Family Medicine

## 2018-06-08 DIAGNOSIS — H6122 Impacted cerumen, left ear: Secondary | ICD-10-CM | POA: Diagnosis not present

## 2018-06-08 MED ORDER — NEOMYCIN-POLYMYXIN-HC 3.5-10000-1 OT SUSP: 4.0000 [drp] | Freq: Three times a day (TID) | OTIC | 0 refills | Status: AC

## 2018-06-08 NOTE — ED Provider Notes (Signed)
[facility]    CSN: [id number] Arrival date & time: 06/08/18  1022     History   Chief Complaint Chief Complaint  Patient presents with  . Otalgia    HPI [name] is a 73 y.o. female.   HPI  Ear was flushed at her PCP office several days ago Today woke up with fullness and inability to hear from L ear Has some bleeding at first, none for a couple of days  Past Medical History:  Diagnosis Date  . Acute pharyngitis   . Acute sinusitis, unspecified   . Allergic rhinitis, cause unspecified   . Allergy    seasonal  . Asthma   . Chronic airway obstruction, not elsewhere classified   . Dysfunction of eustachian tube   . Esophageal reflux   . Hemorrhoids   . Hiatal hernia   . IBS (irritable bowel syndrome)   . Osteopenia   . Personal history of colonic polyps   . Schatzki's ring   . Unspecified asthma(493.90)   . Unspecified gastritis and gastroduodenitis without mention of hemorrhage     Patient Active Problem List   Diagnosis Date Noted  . Lung nodule 08/27/2017  . Obstructive sleep apnea 03/15/2017  . History of food allergy 07/10/2016  . Elevated blood-pressure reading without diagnosis of hypertension 07/10/2016  . 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 ANEMIA, IRON DEFICIENCY 04/20/2010  . IRRITABLE BOWEL SYNDROME 03/20/2008  . DIARRHEA 03/20/2008  . ABDOMINAL PAIN, EPIGASTRIC 03/20/2008  . GASTRITIS 03/16/2008  . HIATAL HERNIA 03/16/2008  . COLONIC POLYPS, HX OF 03/16/2008  . SINUSITIS, ACUTE 12/26/2007  . PHARYNGITIS 12/19/2007  . DYSFUNCTION, EUSTACHIAN TUBE 03/12/2007  . Seasonal and perennial allergic rhinitis 03/12/2007  . Asthma, mild intermittent 03/12/2007  . COPD mixed type (HCC) 03/12/2007  . GERD 03/12/2007    Past Surgical History:  Procedure Laterality Date  . ABDOMINAL HYSTERECTOMY    . CYST REMOVAL HAND    . FOOT SURGERY    . LAPAROSCOPIC HYSTERECTOMY     . PARTIAL MASTECTOMY WITH NEEDLE LOCALIZATION Left 08/27/2012   Procedure: PARTIAL MASTECTOMY WITH NEEDLE LOCALIZATION;  Surgeon: Ernestene Mention, MD;  Location: [facility];  Service: General;  Laterality: Left;  . TONSILLECTOMY      OB History   No obstetric history on file.      Home Medications    Prior to Admission medications   Medication Sig Start Date End Date Taking? Authorizing Provider  CALCIUM-VITAMIN D PO Take 2,000 Units by mouth daily.    Yes [provider]  denosumab (PROLIA) 60 MG/ML SOLN injection    Yes [provider]  losartan (COZAAR) 25 MG tablet Take 25 mg by mouth daily.   Yes [provider]  Multiple Vitamin (MULTIVITAMIN WITH MINERALS) TABS Take 1 tablet by mouth daily.   Yes [provider]  pantoprazole (PROTONIX) 40 MG tablet TAKE 1 TABLET BY MOUTH  DAILY 02/13/17  Yes Hilarie Fredrickson, MD  bismuth subsalicylate (PEPTO BISMOL) 262 MG/15ML suspension Take 30 mLs by mouth at bedtime as needed.    [provider]  calcium carbonate (TUMS - DOSED IN MG ELEMENTAL CALCIUM) 500 MG chewable tablet Chew 2 tablets by mouth at bedtime.    [provider]  fluticasone (FLONASE) 50 MCG/ACT nasal spray Place 2 sprays into both nostrils daily as needed for allergies or rhinitis. 03/04/18   Waymon Budge, MD  loperamide (IMODIUM) 2 MG  capsule Take 2 mg by mouth as needed for diarrhea or loose stools.    [provider]  neomycin-polymyxin-hydrocortisone (CORTISPORIN) 3.5-10000-1 OTIC suspension Place 4 drops into the left ear 3 (three) times daily for 5 days. 06/08/18 06/13/18  Eustace MooreNelson, Lourie Retz Sue, MD    Family History Family History  Problem Relation Age of Onset  . Diabetes Sister   . Allergies Sister   . Alzheimer's disease Father   . Allergies Brother   . Allergies Brother   . Asthma Maternal Grandfather   . Colon cancer Neg Hx     Social History Social History   Tobacco Use  . Smoking status: Never  Smoker  . Smokeless tobacco: Never Used  Substance Use Topics  . Alcohol use: No    Alcohol/week: 0.0 standard drinks  . Drug use: No     Allergies   Codeine and Levaquin [levofloxacin]   Review of Systems Review of Systems  Constitutional: Negative for chills and fever.  HENT: Positive for hearing loss. Negative for ear pain and sore throat.   Eyes: Negative for pain and visual disturbance.  Respiratory: Negative for cough and shortness of breath.   Cardiovascular: Negative for chest pain and palpitations.  Gastrointestinal: Negative for abdominal pain and vomiting.  Genitourinary: Negative for dysuria and hematuria.  Musculoskeletal: Negative for arthralgias and back pain.  Skin: Negative for color change and rash.  Neurological: Negative for seizures and syncope.  All other systems reviewed and are negative.    Physical Exam Triage Vital Signs ED Triage Vitals [06/08/18 1052]  Enc Vitals Group     BP (!) 165/97     Pulse Rate 90     Resp 16     Temp 98.5 F (36.9 C)     Temp Source Oral     SpO2 100 %     Weight      Height      Head Circumference      Peak Flow      Pain Score 8     Pain Loc      Pain Edu?      Excl. in GC?    No data found.  Updated Vital Signs BP (!) 165/97   Pulse 90   Temp 98.5 F (36.9 C) (Oral)   Resp 16   SpO2 100%       Physical Exam Constitutional:      General: She is not in acute distress.    Appearance: She is well-developed.  HENT:     Head: Normocephalic and atraumatic.     Right Ear: Tympanic membrane, ear canal and external ear normal.     Left Ear: There is impacted cerumen.     Nose: Nose normal. No congestion.     Mouth/Throat:     Mouth: Mucous membranes are moist.  Eyes:     Conjunctiva/sclera: Conjunctivae normal.     Pupils: Pupils are equal, round, and reactive to light.  Neck:     Musculoskeletal: Normal range of motion.  Cardiovascular:     Rate and Rhythm: Normal rate and regular rhythm.    Pulmonary:     Effort: Pulmonary effort is normal. No respiratory distress.     Breath sounds: Normal breath sounds.  Abdominal:     General: There is no distension.     Palpations: Abdomen is soft.  Musculoskeletal: Normal range of motion.  Lymphadenopathy:     Cervical: No cervical adenopathy.  Skin:    General:  Skin is warm and dry.  Neurological:     Mental Status: She is alert.      UC Treatments / Results  Labs (all labs ordered are listed, but only abnormal results are displayed) Labs Reviewed - No data to display  EKG None  Radiology No results found.  Procedures Procedures ear was irrigated successfully.  After irrigation reexamination of the canal reveals some ecchymosis.  No open wound.  The TM is clear and intact  Medications Ordered in UC Medications - No data to display  Initial Impression / Assessment and Plan / UC Course  I have reviewed the triage vital signs and the nursing notes.  Pertinent labs & imaging results that were available during my care of the patient were reviewed by me and considered in my medical decision making (see chart for details).       Final Clinical Impressions(s) / UC Diagnoses   Final diagnoses:  Impacted cerumen of left ear     Discharge Instructions     Use the drops for a few days See your doctor if needed    ED Prescriptions    Medication Sig Dispense Auth. Provider   neomycin-polymyxin-hydrocortisone (CORTISPORIN) 3.5-10000-1 OTIC suspension Place 4 drops into the left ear 3 (three) times daily for 5 days. 10 mL Eustace Moore, MD     Controlled Substance Prescriptions [place] Controlled Substance Registry consulted? Not Applicable   Eustace Moore, MD 06/08/18 [phone]

## 2018-06-08 NOTE — Discharge Instructions (Signed)
Use the drops for a few days See your doctor if needed

## 2018-06-08 NOTE — ED Triage Notes (Signed)
Reports having left ear lavage at PCP 3 days ago.  States c/o significant left ear pain now.

## 2018-09-05 ENCOUNTER — Ambulatory Visit: Payer: Medicare Other | Admitting: Internal Medicine

## 2018-10-07 ENCOUNTER — Encounter: Payer: Self-pay | Admitting: Internal Medicine

## 2018-10-08 ENCOUNTER — Ambulatory Visit (INDEPENDENT_AMBULATORY_CARE_PROVIDER_SITE_OTHER): Payer: Medicare Other | Admitting: Internal Medicine

## 2018-10-08 ENCOUNTER — Other Ambulatory Visit: Payer: Self-pay

## 2018-10-08 ENCOUNTER — Encounter: Payer: Self-pay | Admitting: Internal Medicine

## 2018-10-08 ENCOUNTER — Ambulatory Visit (INDEPENDENT_AMBULATORY_CARE_PROVIDER_SITE_OTHER): Payer: Medicare Other

## 2018-10-08 VITALS — BP 134/80 | HR 78 | Ht 62.0 in | Wt 112.0 lb

## 2018-10-08 DIAGNOSIS — R911 Solitary pulmonary nodule: Secondary | ICD-10-CM

## 2018-10-08 DIAGNOSIS — J449 Chronic obstructive pulmonary disease, unspecified: Secondary | ICD-10-CM

## 2018-10-08 DIAGNOSIS — G4733 Obstructive sleep apnea (adult) (pediatric): Secondary | ICD-10-CM | POA: Diagnosis not present

## 2018-10-08 NOTE — Patient Instructions (Signed)
Order- CXR   Dx Lung nodule  We can continue CPAP auto 5-10, mask of choice, humidifier, supplies, AirView/ card  Please call if we can help

## 2018-10-08 NOTE — Assessment & Plan Note (Signed)
Continuing to benefit from CPAP, with good compliance and control. Plan- continue CPAP auto 5-10

## 2018-10-08 NOTE — Assessment & Plan Note (Signed)
Low concern based on last CT in a non-smoker, but we agreed to check status. Plan - CXR

## 2018-10-08 NOTE — Progress Notes (Signed)
HPI female never smoker followed for OSA,Allergic rhinitis, asthma, complicated by ulcerative colitis PFT 2011 mild obstruction with air trapping PFT 06/21/16-moderate obstructive airways disease insignificant response to bronchodilator diffusion mildly reduced. FVC 1.97/71%, FEV1 1.41/68%, ratio 0.72, TLC 99%, DLCO 75% HST-04/04/17-AHI 13.5/hour, desaturation to 73%, body weight 112 pounds CT chest 06/21/16-5 mm left lower lobe pulmonary nodule- low risk -----------------------------------------------------.  06/06/2018- 73 year old female never smoker followed for OSA, Allergic rhinitis, Asthma, LLL nodule, fatigue, complicated by ulcerative colitis, GERD CPAP auto 5-10/Aero care Download 86.7% compliance AHI 4.2/hour Anoro, Flonase -----Asthma: Pt had sinus infection around Thanksgiving-Zpak cleared up and recently had PNA. Would like to know about soreness in chest. Was treated with levaquin- dose reduced to 250 mg for tolerance. She brings disk from 05/27/18- I reviewed and don't identify an infiltrate.  She was treated for pneumonia, apparently clinical diagnosis.  Still some soreness across anterior upper chest with deep breath. Bled from left ear after earwax flush at PCP yesterday.  10/08/2018-  73 year old female never smoker followed for OSA, Allergic rhinitis, Asthma, LLL nodule, fatigue, complicated by ulcerative colitis, GERD CPAP auto 5-10/Aero care Download - using almost every night, avg 5.9 hours, AHI 3.9/ hr Flonase, no longer needs any inhalers -----OSA on CPAP, states in the middle of night, she'll slip out of her mask (nasal pillow), states CY recently lowered settings in Jan 2020, tries to use it every night Denies unusual movement in sleep to suggest parasomnia. Very good Spring after sinusitis last winter. Working outdoors, avoiding people. Discussed CXR given hex lung nodule.  ROS-see HPI  "+" = positive Constitutional:   No-   weight loss, night sweats, fevers, chills,  + fatigue, lassitude. HEENT:   No-  headaches, difficulty swallowing, tooth/dental problems, sore throat,       No-  sneezing, itching, ear ache, +nasal congestion, +post nasal drip,  CV:  + chest pain, orthopnea, PND, swelling in lower extremities, anasarca,  dizziness, palpitations Resp: No-   shortness of breath with exertion or at rest.                 productive cough,  No non-productive cough,  No- coughing up of blood.              No-   change in color of mucus.  No- wheezing.   Skin: No-   rash or lesions. GI:  + heartburn, indigestion, abdominal pain, nausea, vomiting,  GU:  MS:  No-   joint pain or swelling.   Neuro-     nothing unusual Psych:  No- change in mood or affect. No depression or anxiety.  No memory loss.  OBJ- Physical Exam General- Alert, Oriented, Affect-appropriate, Distress- none acute, + trim and alert Skin- rash-none, lesions- none, excoriation- none Lymphadenopathy- none Head- atraumatic            Eyes- Gross vision intact, PERRLA, conjunctivae and secretions clear            Ears- Hearing, canals-normal            Nose-  +Mild turbinate edema, +Septal dev, no-mucus, polyps,  erosions             Throat- Mallampati IV , mucosa clear , drainage- none, tonsils- atrophic Neck- flexible , trachea midline, no stridor , thyroid nl, carotid no bruit Chest - symmetrical excursion , unlabored           Heart/CV- RRR , no murmur , no gallop  , no rub, nl  s1 s2                           - JVD- none , edema- none, stasis changes- none, varices- none           Lung- clear, wheeze- none, cough-none , dullness-none, rub- none           Chest wall-  Abd-  Br/ Gen/ Rectal- Not done, not indicated Extrem- cyanosis- none, clubbing, none, atrophy- none, strength- nl Neuro- grossly intact to observation

## 2018-10-08 NOTE — Assessment & Plan Note (Signed)
Breathing comfortably now without routine cough or wheeze. She is not needing inhalers.

## 2018-10-09 ENCOUNTER — Telehealth: Payer: Self-pay | Admitting: Internal Medicine

## 2018-10-09 NOTE — Telephone Encounter (Signed)
Notes recorded by Cornell Barman, CMA on 10/08/2018 at 4:18 PM EDT Attempted to call patient today regarding results. I did not receive an answer at time of call. I have left a voicemail message forptto return call. X1  ------  Notes recorded by Waymon Budge, MD on 10/08/2018 at 4:12 PM EDT CXR- stable with old changes of bronchitis/ asthma. No nodule is seen. It may just remain too small to see without CT scan, but that is ok, as long as it isn't growing, its good.  Called and spoke with pt letting her know the results of the cxr. Pt expressed understanding. Nothing further needed.

## 2018-10-10 ENCOUNTER — Telehealth: Payer: Self-pay | Admitting: Internal Medicine

## 2018-10-10 NOTE — Telephone Encounter (Signed)
I don't think we need to automatically schedule cxr. Let's wait and see how she is doing at our next visit.

## 2018-10-10 NOTE — Telephone Encounter (Signed)
Called & spoke w/ pt. Pt was returning call for her CXR results on 10/08/2018. I resulted it per CY's interpretation, however, I noticed from a previous telephone encounter 10/08/2018 that they had already been resulted. Will make changes to result notes. I apologized to pt for the redundancy and she expressed understanding.   Pt wants to know if she will need a f/u CXR for next year 2021. There was no mention of a yearly CXR in her LOV notes 10/08/2018. I informed pt I would route a message to CY to see what he would recommend. Pt advised me to leave a detailed message on her voicemail [phone] in regards to this. I ensured her that we would.   CY, please advise if you would like for this pt to have a f/u CXR for 2021. Thank you.

## 2018-10-10 NOTE — Telephone Encounter (Signed)
Called and spoke with Patient.  Dr. Young's recommendations given.  Understanding stated.  Nothing further at this time. 

## 2018-11-13 ENCOUNTER — Telehealth: Payer: Self-pay | Admitting: Internal Medicine

## 2018-11-13 MED ORDER — AZITHROMYCIN 250 MG PO TABS: ORAL_TABLET | ORAL | 0 refills | Status: DC

## 2018-11-13 NOTE — Telephone Encounter (Signed)
Offer Zpak 250 mg, # 6, 2 today then one daily  Ok to try something like Mucinex-DM if needed for symptoms

## 2018-11-13 NOTE — Telephone Encounter (Signed)
Called and spoke with pt who stated due to the weather, she has been having sinus drainage along with a bad taste in her mouth due to it. Pt also has complaints of tightness in her throat.  Pt also has a mild cough and states she is not currently coughing up any mucus. Pt does not have any pain in her sinuses yet but due to her current symptoms, she wanted to try to catch the sinus infection early before it became worse.  Pt denies any complaints of fever, wheezing, body aches or chills.  Pt is requesting to have meds prescribed to help with her symptoms.  Dr. Annamaria Boots, please advise on this for pt. Thank you!  Allergies  Allergen Reactions  . Codeine Nausea Only  . Levaquin [Levofloxacin]     IN HIGH DOSES-Can used 250mg  dose.   RASH and DIZZY     Current Outpatient Medications:  .  bismuth subsalicylate (PEPTO BISMOL) 262 MG/15ML suspension, Take 30 mLs by mouth at bedtime as needed., Disp: , Rfl:  .  calcium carbonate (TUMS - DOSED IN MG ELEMENTAL CALCIUM) 500 MG chewable tablet, Chew 2 tablets by mouth at bedtime., Disp: , Rfl:  .  CALCIUM-VITAMIN D PO, Take 2,000 Units by mouth daily. , Disp: , Rfl:  .  denosumab (PROLIA) 60 MG/ML SOLN injection, , Disp: , Rfl:  .  fluticasone (FLONASE) 50 MCG/ACT nasal spray, Place 2 sprays into both nostrils daily as needed for allergies or rhinitis., Disp: 16 g, Rfl: 12 .  loperamide (IMODIUM) 2 MG capsule, Take 2 mg by mouth as needed for diarrhea or loose stools., Disp: , Rfl:  .  losartan (COZAAR) 25 MG tablet, Take 25 mg by mouth daily., Disp: , Rfl:  .  Multiple Vitamin (MULTIVITAMIN WITH MINERALS) TABS, Take 1 tablet by mouth daily., Disp: , Rfl:  .  pantoprazole (PROTONIX) 40 MG tablet, TAKE 1 TABLET BY MOUTH  DAILY, Disp: 90 tablet, Rfl: 0

## 2018-11-13 NOTE — Telephone Encounter (Signed)
Called and spoke with pt letting her know we were going to send zpak to pharmacy for her. Pt verbalized understanding. Verified pt's preferred pharmacy and sent med in. Nothing further needed.

## 2018-12-20 ENCOUNTER — Ambulatory Visit: Payer: Medicare Other | Admitting: Internal Medicine

## 2019-04-10 ENCOUNTER — Ambulatory Visit: Payer: Medicare Other | Admitting: Internal Medicine

## 2019-05-25 ENCOUNTER — Encounter: Payer: Self-pay | Admitting: Internal Medicine

## 2019-05-26 ENCOUNTER — Ambulatory Visit (INDEPENDENT_AMBULATORY_CARE_PROVIDER_SITE_OTHER): Payer: Medicare Other | Admitting: Internal Medicine

## 2019-05-26 ENCOUNTER — Encounter: Payer: Self-pay | Admitting: Internal Medicine

## 2019-05-26 ENCOUNTER — Other Ambulatory Visit: Payer: Self-pay

## 2019-05-26 DIAGNOSIS — J302 Other seasonal allergic rhinitis: Secondary | ICD-10-CM | POA: Diagnosis not present

## 2019-05-26 DIAGNOSIS — J4520 Mild intermittent asthma, uncomplicated: Secondary | ICD-10-CM

## 2019-05-26 DIAGNOSIS — J3089 Other allergic rhinitis: Secondary | ICD-10-CM

## 2019-05-26 DIAGNOSIS — G4733 Obstructive sleep apnea (adult) (pediatric): Secondary | ICD-10-CM | POA: Diagnosis not present

## 2019-05-26 MED ORDER — AZITHROMYCIN 250 MG PO TABS: ORAL_TABLET | ORAL | 4 refills | Status: DC

## 2019-05-26 MED ORDER — ALBUTEROL SULFATE HFA 108 (90 BASE) MCG/ACT IN AERS: 2.0000 | INHALATION_SPRAY | Freq: Four times a day (QID) | RESPIRATORY_TRACT | 12 refills | Status: DC | PRN

## 2019-05-26 NOTE — Progress Notes (Signed)
HPI female never smoker followed for OSA,Allergic rhinitis, asthma, complicated by ulcerative colitis PFT 2011 mild obstruction with air trapping PFT 06/21/16-moderate obstructive airways disease insignificant response to bronchodilator diffusion mildly reduced. FVC 1.97/71%, FEV1 1.41/68%, ratio 0.72, TLC 99%, DLCO 75% HST-04/04/17-AHI 13.5/hour, desaturation to 73%, body weight 112 pounds CT chest 06/21/16-5 mm left lower lobe pulmonary nodule- low risk -----------------------------------------------------.   10/08/2018-  73 year old female never smoker followed for OSA, Allergic rhinitis, Asthma, LLL nodule, fatigue, complicated by ulcerative colitis, GERD CPAP auto 5-10/Aero care Download - using almost every night, avg 5.9 hours, AHI 3.9/ hr Flonase, no longer needs any inhalers -----OSA on CPAP, states in the middle of night, she'll slip out of her mask (nasal pillow), states CY recently lowered settings in Jan 2020, tries to use it every night Denies unusual movement in sleep to suggest parasomnia. Very good Spring after sinusitis last winter. Working outdoors, avoiding people. Discussed CXR given hex lung nodule.  05/26/2019- 73 year old female never smoker followed for OSA, Allergic rhinitis, Asthma, LLL nodule, fatigue, complicated by ulcerative colitis, GERD CPAP auto 5-10/Aero care Download indicates excellent control AHI 0.6/ hr Body weight 107 lbs Flonase,  Wants to keep spare full-face mask on hand. Nasal congestion at times interferes with nasal mask. GERD occasionally does cut breath off with cough and laryngospasm.  CXR 10/08/2018-  IMPRESSION: 1. Stable chest x-ray with hyperinflation and chronic bronchitic changes suggesting underlying COPD. 2. The 5 mm nodule reportedly in the left lower lobe on prior CT imaging is not visible by conventional x-ray.   ROS-see HPI  "+" = positive Constitutional:   No-   weight loss, night sweats, fevers, chills, + fatigue,  lassitude. HEENT:   No-  headaches, difficulty swallowing, tooth/dental problems, sore throat,       No-  sneezing, itching, ear ache, +nasal congestion, +post nasal drip,  CV:  + chest pain, orthopnea, PND, swelling in lower extremities, anasarca,  dizziness, palpitations Resp: No-   shortness of breath with exertion or at rest.                 productive cough,  No non-productive cough,  No- coughing up of blood.              No-   change in color of mucus.  No- wheezing.   Skin: No-   rash or lesions. GI:  + heartburn, indigestion, abdominal pain, nausea, vomiting,  GU:  MS:  No-   joint pain or swelling.   Neuro-     nothing unusual Psych:  No- change in mood or affect. No depression or anxiety.  No memory loss.  OBJ- Physical Exam General- Alert, Oriented, Affect-appropriate, Distress- none acute, + trim and alert Skin- rash-none, lesions- none, excoriation- none Lymphadenopathy- none Head- atraumatic            Eyes- Gross vision intact, PERRLA, conjunctivae and secretions clear            Ears- Hearing, canals-normal            Nose-  +Mild turbinate edema, +Septal dev, no-mucus, polyps,  erosions             Throat- Mallampati IV , mucosa clear , drainage- none, tonsils- atrophic Neck- flexible , trachea midline, no stridor , thyroid nl, carotid no bruit Chest - symmetrical excursion , unlabored           Heart/CV- RRR , no murmur , no gallop  , no rub, nl s1  s2                           - JVD- none , edema- none, stasis changes- none, varices- none           Lung- clear, wheeze- none, cough- minimal , dullness-none, rub- none           Chest wall-  Abd-  Br/ Gen/ Rectal- Not done, not indicated Extrem- cyanosis- none, clubbing, none, atrophy- none, strength- nl Neuro- grossly intact to observation

## 2019-05-26 NOTE — Patient Instructions (Addendum)
Scripts sent for Zpak and for albuterol rescue inhaler  We can continue CPAP auto 5-15, mask of choice, humidifier, supplies, Air View/ card  Please call if we can help

## 2019-05-27 NOTE — Assessment & Plan Note (Signed)
Does own yard work. Outside a lot. Plan- refill rescue inhaler

## 2019-05-27 NOTE — Assessment & Plan Note (Signed)
Manage flonase, decongestants Zpak use discussed.

## 2019-05-27 NOTE — Assessment & Plan Note (Signed)
Say s nasal congestion interferes with nasal mask use. Discussed mask options. Continue auto 5-10

## 2019-07-21 ENCOUNTER — Ambulatory Visit: Payer: Medicare Other | Attending: Internal Medicine

## 2019-07-21 DIAGNOSIS — Z23 Encounter for immunization: Secondary | ICD-10-CM | POA: Insufficient documentation

## 2019-07-21 NOTE — Progress Notes (Signed)
   Covid-19 Vaccination Clinic  Name:  [name]    MRN: [id number] DOB: 1945/11/18  07/21/2019  [name] was observed post Covid-19 immunization for 15 minutes without incidence. She was provided with Vaccine Information Sheet and instruction to access the V-Safe system.   [name] was instructed to call 911 with any severe reactions post vaccine: Marland Kitchen Difficulty breathing  . Swelling of your face and throat  . A fast heartbeat  . A bad rash all over your body  . Dizziness and weakness    Immunizations Administered    Name Date Dose VIS Date Route   Pfizer COVID-19 Vaccine 07/21/2019 10:29 AM 0.3 mL 05/09/2019 Intramuscular   Manufacturer: [employer], [employer]   Lot: [id number]   NDC: [address]-0

## 2019-08-12 ENCOUNTER — Ambulatory Visit: Payer: Medicare Other | Attending: Internal Medicine

## 2019-08-12 DIAGNOSIS — Z23 Encounter for immunization: Secondary | ICD-10-CM

## 2019-08-12 NOTE — Progress Notes (Signed)
   Covid-19 Vaccination Clinic  Name:  Sujey Gundry    MRN: [id number] DOB: 02-12-1946  08/12/2019  Ms. Musgrave was observed post Covid-19 immunization for 15 minutes without incident. She was provided with Vaccine Information Sheet and instruction to access the V-Safe system.   Ms. Johndrow was instructed to call 911 with any severe reactions post vaccine: Marland Kitchen Difficulty breathing  . Swelling of face and throat  . A fast heartbeat  . A bad rash all over body  . Dizziness and weakness   Immunizations Administered    Name Date Dose VIS Date Route   Pfizer COVID-19 Vaccine 08/12/2019 10:38 AM 0.3 mL 05/09/2019 Intramuscular   Manufacturer: [employer], [employer]   Lot: [id number]   NDC: [address]-7

## 2019-11-07 ENCOUNTER — Ambulatory Visit (HOSPITAL_COMMUNITY)
Admission: EM | Admit: 2019-11-07 | Discharge: 2019-11-07 | Disposition: A | Discharge status: Home / Self Care | Payer: Medicare Other

## 2019-11-07 ENCOUNTER — Encounter (HOSPITAL_COMMUNITY): Payer: Self-pay

## 2019-11-07 ENCOUNTER — Other Ambulatory Visit: Payer: Self-pay

## 2019-11-07 DIAGNOSIS — W57XXXA Bitten or stung by nonvenomous insect and other nonvenomous arthropods, initial encounter: Secondary | ICD-10-CM | POA: Diagnosis not present

## 2019-11-07 DIAGNOSIS — S30860A Insect bite (nonvenomous) of lower back and pelvis, initial encounter: Secondary | ICD-10-CM

## 2019-11-07 HISTORY — DX: Essential (primary) hypertension: I10

## 2019-11-07 MED ORDER — TRIAMCINOLONE ACETONIDE 0.1 % EX CREA: 1.0000 "application " | TOPICAL_CREAM | Freq: Two times a day (BID) | CUTANEOUS | 0 refills | Status: DC

## 2019-11-07 NOTE — ED Triage Notes (Signed)
Pt states she removed a "dog tick" from her back on Wednesday and is concerned for any potential complications. Denies fever, n/v, joint pain. States "it feels warm back there". Small concentrated area of erythema noted; absent of any bullseye erythema pattern.

## 2019-11-07 NOTE — ED Provider Notes (Signed)
[place]    CSN: [id number] Arrival date & time: 11/07/19  0845      History   Chief Complaint Chief Complaint  Patient presents with  . tick bite complication    HPI [name] is a 74 y.o. female.   Patient here c/w "tick bite to back" x 2 days ago.  She states she was bitten by a "Dog tick", not a deer tick, reports history of several tick bites a year, as she [place].  She admits erythema and itching at bite site, denies pain, tenderness, f/c, n/v, HA.  She has not tried any medications or ointments for this.     Past Medical History:  Diagnosis Date  . Acute pharyngitis   . Acute sinusitis, unspecified   . Allergic rhinitis, cause unspecified   . Allergy    seasonal  . Asthma   . Chronic airway obstruction, not elsewhere classified   . Dysfunction of eustachian tube   . Esophageal reflux   . Hemorrhoids   . Hiatal hernia   . Hypertension   . IBS (irritable bowel syndrome)   . Osteopenia   . Personal history of colonic polyps   . Schatzki's ring   . Unspecified asthma(493.90)   . Unspecified gastritis and gastroduodenitis without mention of hemorrhage     Patient Active Problem List   Diagnosis Date Noted  . Lung nodule 08/27/2017  . Obstructive sleep apnea 03/15/2017  . History of food allergy 07/10/2016  . Elevated blood-pressure reading without diagnosis of hypertension 07/10/2016  . 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 ANEMIA, IRON DEFICIENCY 04/20/2010  . IRRITABLE BOWEL SYNDROME 03/20/2008  . DIARRHEA 03/20/2008  . ABDOMINAL PAIN, EPIGASTRIC 03/20/2008  . GASTRITIS 03/16/2008  . HIATAL HERNIA 03/16/2008  . COLONIC POLYPS, HX OF 03/16/2008  . SINUSITIS, ACUTE 12/26/2007  . PHARYNGITIS 12/19/2007  . DYSFUNCTION, EUSTACHIAN TUBE 03/12/2007  . Seasonal and perennial allergic rhinitis 03/12/2007  . Asthma, mild intermittent 03/12/2007  . COPD  mixed type ([place]) 03/12/2007  . GERD 03/12/2007    Past Surgical History:  Procedure Laterality Date  . ABDOMINAL HYSTERECTOMY    . CYST REMOVAL HAND    . FOOT SURGERY    . LAPAROSCOPIC HYSTERECTOMY    . PARTIAL MASTECTOMY WITH NEEDLE LOCALIZATION Left 08/27/2012   Procedure: PARTIAL MASTECTOMY WITH NEEDLE LOCALIZATION;  Surgeon: Adin Hector, MD;  Location: [place];  Service: General;  Laterality: Left;  . TONSILLECTOMY      OB History   No obstetric history on file.      Home Medications    Prior to Admission medications   Medication Sig Start Date End Date Taking? Authorizing Provider  fluticasone (FLONASE) 50 MCG/ACT nasal spray Place 2 sprays into both nostrils daily as needed for allergies or rhinitis. 03/04/18  Yes Young, Tarri Fuller D, MD  losartan (COZAAR) 25 MG tablet losartan 25 mg tablet  Take 1 tablet every day by oral route.   Yes [provider]  pantoprazole (PROTONIX) 40 MG tablet TAKE 1 TABLET BY MOUTH  DAILY 02/13/17  Yes Irene Shipper, MD  albuterol (VENTOLIN HFA) 108 (90 Base) MCG/ACT inhaler Inhale 2 puffs into the lungs every 6 (six) hours as needed for wheezing or shortness of breath. 05/26/19   Baird Lyons D, MD  Ascorbic Acid (VITAMIN C ADULT GUMMIES PO) Take by mouth.    [provider]  azithromycin (ZITHROMAX) 250 MG tablet  Take two today and then one daily until finished 05/26/19   Young, Joni Fears D, MD  bismuth subsalicylate (PEPTO BISMOL) 262 MG/15ML suspension Take 30 mLs by mouth at bedtime as needed.    [provider]  calcium carbonate (TUMS - DOSED IN MG ELEMENTAL CALCIUM) 500 MG chewable tablet Chew 2 tablets by mouth at bedtime.    [provider]  CALCIUM-VITAMIN D PO Take 2,000 Units by mouth daily.     [provider]  denosumab (PROLIA) 60 MG/ML SOLN injection     [provider]  loperamide (IMODIUM) 2 MG capsule Take 2 mg by mouth as needed for diarrhea or loose stools.    [provider]  losartan (COZAAR) 50 MG tablet Take 50 mg by mouth daily. 04/07/19   [provider]  Multiple Vitamin (MULTIVITAMIN WITH MINERALS) TABS Take 1 tablet by mouth daily.    [provider]  pramoxine-hydrocortisone (ANALPRAM HC) cream hydrocortisone-pramoxine 1 %-1 % rectal cream  USE RECTALLY EVERY DAY    [provider]  triamcinolone cream (KENALOG) 0.1 % Apply 1 application topically 2 (two) times daily. 11/07/19   Evern Core, PA-C    Family History Family History  Problem Relation Age of Onset  . Diabetes Sister   . Allergies Sister   . Alzheimer's disease Father   . Allergies Brother   . Allergies Brother   . Asthma Maternal Grandfather   . Colon cancer Neg Hx     Social History Social History   Tobacco Use  . Smoking status: Never Smoker  . Smokeless tobacco: Never Used  Vaping Use  . Vaping Use: Never used  Substance Use Topics  . Alcohol use: No    Alcohol/week: 0.0 standard drinks  . Drug use: No     Allergies   Codeine and Levaquin [levofloxacin]   Review of Systems Review of Systems  Constitutional: Negative for chills, fatigue and fever.  HENT: Negative for ear pain and sore throat.   Eyes: Negative for pain and visual disturbance.  Respiratory: Negative for cough and shortness of breath.   Cardiovascular: Negative for chest pain and palpitations.  Gastrointestinal: Negative for abdominal pain and vomiting.  Genitourinary: Negative for dysuria and hematuria.  Musculoskeletal: Negative for arthralgias and back pain.  Skin: Positive for rash.  Neurological: Negative for seizures, syncope and headaches.  Hematological: Negative for adenopathy. Does not bruise/bleed easily.  Psychiatric/Behavioral: Negative for confusion and sleep disturbance.  All other systems reviewed and are negative.    Physical Exam Triage Vital Signs ED Triage Vitals  Enc Vitals Group     BP 11/07/19 0933 (!) 168/100     Pulse  Rate 11/07/19 0933 78     Resp 11/07/19 0933 20     Temp 11/07/19 0933 98.2 F (36.8 C)     Temp Source 11/07/19 0933 Oral     SpO2 11/07/19 0933 98 %     Weight --      Height --      Head Circumference --      Peak Flow --      Pain Score 11/07/19 0930 2     Pain Loc --      Pain Edu? --      Excl. in GC? --    No data found.  Updated Vital Signs BP (!) 168/100 (BP Location: Right Arm)   Pulse 78   Temp 98.2 F (36.8 C) (Oral)   Resp 20   SpO2  98%   Visual Acuity Right Eye Distance:   Left Eye Distance:   Bilateral Distance:    Right Eye Near:   Left Eye Near:    Bilateral Near:     Physical Exam Vitals and nursing note reviewed.  Constitutional:      General: She is not in acute distress.    Appearance: She is well-developed.  HENT:     Head: Normocephalic and atraumatic.  Eyes:     Extraocular Movements: Extraocular movements intact.     Conjunctiva/sclera: Conjunctivae normal.  Pulmonary:     Effort: Pulmonary effort is normal. No respiratory distress.  Abdominal:     Palpations: Abdomen is soft.     Tenderness: There is no abdominal tenderness.  Musculoskeletal:     Cervical back: Normal range of motion and neck supple.  Lymphadenopathy:     Cervical: No cervical adenopathy.  Skin:    General: Skin is warm and dry.     Capillary Refill: Capillary refill takes less than 2 seconds.       Neurological:     General: No focal deficit present.     Mental Status: She is alert and oriented to person, place, and time.  Psychiatric:        Mood and Affect: Mood normal.        Behavior: Behavior normal.      UC Treatments / Results  Labs (all labs ordered are listed, but only abnormal results are displayed) Labs Reviewed - No data to display  EKG   Radiology No results found.  Procedures Procedures (including critical care time)  Medications Ordered in UC Medications - No data to display  Initial Impression / Assessment and Plan / UC  Course  I have reviewed the triage vital signs and the nursing notes.  Pertinent labs & imaging results that were available during my care of the patient were reviewed by me and considered in my medical decision making (see chart for details).     Appears allergic in nature to tick bite, no retained foreign body noted on exam, will try triamcinolone ointment with return precautions provided. Final Clinical Impressions(s) / UC Diagnoses   Final diagnoses:  Tick bite of lower back, initial encounter   Discharge Instructions   None    ED Prescriptions    Medication Sig Dispense Auth. Provider   triamcinolone cream (KENALOG) 0.1 % Apply 1 application topically 2 (two) times daily. 30 g Evern Core, PA-C     PDMP not reviewed this encounter.   Evern Core, PA-C 11/07/19 1011

## 2019-11-16 ENCOUNTER — Ambulatory Visit (HOSPITAL_COMMUNITY)
Admission: EM | Admit: 2019-11-16 | Discharge: 2019-11-16 | Disposition: A | Discharge status: Other Acute Inpatient Hospital | Payer: Medicare Other | Source: Home / Self Care

## 2019-11-16 ENCOUNTER — Encounter (HOSPITAL_COMMUNITY): Payer: Self-pay

## 2019-11-16 ENCOUNTER — Emergency Department (HOSPITAL_COMMUNITY)
Admission: EM | Admit: 2019-11-16 | Discharge: 2019-11-16 | Disposition: A | Discharge status: Home / Self Care | Payer: Medicare Other | Attending: Emergency Medicine | Admitting: Emergency Medicine

## 2019-11-16 ENCOUNTER — Other Ambulatory Visit: Payer: Self-pay

## 2019-11-16 ENCOUNTER — Emergency Department (HOSPITAL_COMMUNITY): Payer: Medicare Other

## 2019-11-16 DIAGNOSIS — J45909 Unspecified asthma, uncomplicated: Secondary | ICD-10-CM | POA: Diagnosis not present

## 2019-11-16 DIAGNOSIS — Z79899 Other long term (current) drug therapy: Secondary | ICD-10-CM | POA: Insufficient documentation

## 2019-11-16 DIAGNOSIS — Y999 Unspecified external cause status: Secondary | ICD-10-CM | POA: Diagnosis not present

## 2019-11-16 DIAGNOSIS — S90862A Insect bite (nonvenomous), left foot, initial encounter: Secondary | ICD-10-CM | POA: Insufficient documentation

## 2019-11-16 DIAGNOSIS — R079 Chest pain, unspecified: Secondary | ICD-10-CM

## 2019-11-16 DIAGNOSIS — W57XXXA Bitten or stung by nonvenomous insect and other nonvenomous arthropods, initial encounter: Secondary | ICD-10-CM | POA: Diagnosis not present

## 2019-11-16 DIAGNOSIS — I10 Essential (primary) hypertension: Secondary | ICD-10-CM | POA: Diagnosis not present

## 2019-11-16 DIAGNOSIS — R42 Dizziness and giddiness: Secondary | ICD-10-CM | POA: Diagnosis not present

## 2019-11-16 DIAGNOSIS — R0789 Other chest pain: Secondary | ICD-10-CM | POA: Insufficient documentation

## 2019-11-16 DIAGNOSIS — Y93H2 Activity, gardening and landscaping: Secondary | ICD-10-CM | POA: Diagnosis not present

## 2019-11-16 DIAGNOSIS — Y92017 Garden or yard in single-family (private) house as the place of occurrence of the external cause: Secondary | ICD-10-CM | POA: Insufficient documentation

## 2019-11-16 HISTORY — DX: Sleep apnea, unspecified: G47.30

## 2019-11-16 LAB — CBC WITH DIFFERENTIAL/PLATELET
Abs Immature Granulocytes: 0 10*3/uL (ref 0.00–0.07)
Basophils Absolute: 0.1 10*3/uL (ref 0.0–0.1)
Basophils Relative: 2 %
Eosinophils Absolute: 0.1 10*3/uL (ref 0.0–0.5)
Eosinophils Relative: 2 %
HCT: 35.3 % — ABNORMAL LOW (ref 36.0–46.0)
Hemoglobin: 11.3 g/dL — ABNORMAL LOW (ref 12.0–15.0)
Immature Granulocytes: 0 %
Lymphocytes Relative: 28 %
Lymphs Abs: 1.2 10*3/uL (ref 0.7–4.0)
MCH: 29.6 pg (ref 26.0–34.0)
MCHC: 32 g/dL (ref 30.0–36.0)
MCV: 92.4 fL (ref 80.0–100.0)
Monocytes Absolute: 0.3 10*3/uL (ref 0.1–1.0)
Monocytes Relative: 8 %
Neutro Abs: 2.5 10*3/uL (ref 1.7–7.7)
Neutrophils Relative %: 60 %
Platelets: 222 10*3/uL (ref 150–400)
RBC: 3.82 MIL/uL — ABNORMAL LOW (ref 3.87–5.11)
RDW: 13.1 % (ref 11.5–15.5)
WBC: 4.2 10*3/uL (ref 4.0–10.5)
nRBC: 0 % (ref 0.0–0.2)

## 2019-11-16 LAB — COMPREHENSIVE METABOLIC PANEL
ALT: 20 U/L (ref 0–44)
AST: 23 U/L (ref 15–41)
Albumin: 3.8 g/dL (ref 3.5–5.0)
Alkaline Phosphatase: 42 U/L (ref 38–126)
Anion gap: 7 (ref 5–15)
BUN: 15 mg/dL (ref 8–23)
CO2: 27 mmol/L (ref 22–32)
Calcium: 8.9 mg/dL (ref 8.9–10.3)
Chloride: 105 mmol/L (ref 98–111)
Creatinine, Ser: 0.96 mg/dL (ref 0.44–1.00)
GFR calc Af Amer: >60 mL/min (ref >60)
GFR calc non Af Amer: 59 mL/min — ABNORMAL LOW (ref >60)
Glucose, Bld: 111 mg/dL — ABNORMAL HIGH (ref 70–99)
Potassium: 4.6 mmol/L (ref 3.5–5.1)
Sodium: 139 mmol/L (ref 135–145)
Total Bilirubin: 0.5 mg/dL (ref 0.3–1.2)
Total Protein: 7 g/dL (ref 6.5–8.1)

## 2019-11-16 LAB — TROPONIN I (HIGH SENSITIVITY): Troponin I (High Sensitivity): 3 ng/L (ref <18)

## 2019-11-16 MED ORDER — DOXYCYCLINE HYCLATE 100 MG PO CAPS: 100.0000 mg | ORAL_CAPSULE | Freq: Two times a day (BID) | ORAL | 0 refills | Status: DC

## 2019-11-16 NOTE — ED Triage Notes (Addendum)
Pt c/o right sided chest pain that started last night. Pt not currently having chest pain at the moment. Pt denies N/V/ SOB. Pt states she feels dizziness. Pt states she got a tic bite last week. She states she has an itching area on her left foot that she believes has a little deer tic that may still be in there. Pt states she has had several tic bites over the last wk or so and he has vertigo and sinuses. Pt has non labored breathing.

## 2019-11-16 NOTE — ED Notes (Signed)
Lab to add on troponin  

## 2019-11-16 NOTE — ED Provider Notes (Signed)
[facility] EMERGENCY DEPARTMENT Provider Note   CSN: [id number] Arrival date & time: 11/16/19  1233     History Chief Complaint  Patient presents with  . Allergic Reaction  . Tick Removal    Franny Selvage is a 74 y.o. female.  74 year old female presents with right-sided chest pain as well as tick bites.  States that the chest pain has happened twice since yesterday each time lasting for seconds and localizing to her right side.  No associated dizziness diaphoresis.  No recent fever, cough or congestion.  Pain does seem to be worse with palpation.  No pleuritic component to it.  Patient states that she does lots of gardening outside has had multiple tick bites.  Denies any fever or myalgias.  Patient has been using local wound care to her tick bites.  Went to urgent care and was sent here        Past Medical History:  Diagnosis Date  . Acute pharyngitis   . Acute sinusitis, unspecified   . Allergic rhinitis, cause unspecified   . Allergy    seasonal  . Asthma   . Chronic airway obstruction, not elsewhere classified   . Dysfunction of eustachian tube   . Esophageal reflux   . Hemorrhoids   . Hiatal hernia   . Hypertension   . IBS (irritable bowel syndrome)   . Osteopenia   . Personal history of colonic polyps   . Schatzki's ring   . Sleep apnea   . Unspecified asthma(493.90)   . Unspecified gastritis and gastroduodenitis without mention of hemorrhage     Patient Active Problem List   Diagnosis Date Noted  . Lung nodule 08/27/2017  . Obstructive sleep apnea 03/15/2017  . History of food allergy 07/10/2016  . Elevated blood-pressure reading without diagnosis of hypertension 07/10/2016  . 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 ANEMIA, IRON DEFICIENCY 04/20/2010  . IRRITABLE BOWEL SYNDROME 03/20/2008  . DIARRHEA 03/20/2008  . ABDOMINAL PAIN, EPIGASTRIC 03/20/2008  . GASTRITIS  03/16/2008  . HIATAL HERNIA 03/16/2008  . COLONIC POLYPS, HX OF 03/16/2008  . SINUSITIS, ACUTE 12/26/2007  . PHARYNGITIS 12/19/2007  . DYSFUNCTION, EUSTACHIAN TUBE 03/12/2007  . Seasonal and perennial allergic rhinitis 03/12/2007  . Asthma, mild intermittent 03/12/2007  . COPD mixed type ([facility]) 03/12/2007  . GERD 03/12/2007    Past Surgical History:  Procedure Laterality Date  . ABDOMINAL HYSTERECTOMY    . CYST REMOVAL HAND    . FOOT SURGERY    . LAPAROSCOPIC HYSTERECTOMY    . PARTIAL MASTECTOMY WITH NEEDLE LOCALIZATION Left 08/27/2012   Procedure: PARTIAL MASTECTOMY WITH NEEDLE LOCALIZATION;  Surgeon: Ernestene Mention, MD;  Location: [facility] OR;  Service: General;  Laterality: Left;  . TONSILLECTOMY       OB History   No obstetric history on file.     Family History  Problem Relation Age of Onset  . Diabetes Sister   . Allergies Sister   . Alzheimer's disease Father   . Allergies Brother   . Allergies Brother   . Asthma Maternal Grandfather   . Colon cancer Neg Hx     Social History   Tobacco Use  . Smoking status: Never Smoker  . Smokeless tobacco: Never Used  Vaping Use  . Vaping Use: Never used  Substance Use Topics  . Alcohol use: No    Alcohol/week: 0.0 standard drinks  . Drug use: No    Home Medications Prior  to Admission medications   Medication Sig Start Date End Date Taking? Authorizing Provider  albuterol (VENTOLIN HFA) 108 (90 Base) MCG/ACT inhaler Inhale 2 puffs into the lungs every 6 (six) hours as needed for wheezing or shortness of breath. 05/26/19   Baird Lyons D, MD  Ascorbic Acid (VITAMIN C ADULT GUMMIES PO) Take by mouth.    [provider]  azithromycin (ZITHROMAX) 250 MG tablet Take two today and then one daily until finished 05/26/19   Young, Tarri Fuller D, MD  bismuth subsalicylate (PEPTO BISMOL) 262 MG/15ML suspension Take 30 mLs by mouth at bedtime as needed.    [provider]  calcium carbonate (TUMS - DOSED IN MG  ELEMENTAL CALCIUM) 500 MG chewable tablet Chew 2 tablets by mouth at bedtime.    [provider]  CALCIUM-VITAMIN D PO Take 2,000 Units by mouth daily.     [provider]  denosumab (PROLIA) 60 MG/ML SOLN injection     [provider]  fluticasone (FLONASE) 50 MCG/ACT nasal spray Place 2 sprays into both nostrils daily as needed for allergies or rhinitis. 03/04/18   Baird Lyons D, MD  loperamide (IMODIUM) 2 MG capsule Take 2 mg by mouth as needed for diarrhea or loose stools.    [provider]  losartan (COZAAR) 25 MG tablet losartan 25 mg tablet  Take 1 tablet every day by oral route.    [provider]  losartan (COZAAR) 50 MG tablet Take 50 mg by mouth daily. 04/07/19   [provider]  Multiple Vitamin (MULTIVITAMIN WITH MINERALS) TABS Take 1 tablet by mouth daily.    [provider]  pantoprazole (PROTONIX) 40 MG tablet TAKE 1 TABLET BY MOUTH  DAILY 02/13/17   Irene Shipper, MD  pramoxine-hydrocortisone [facility]) cream hydrocortisone-pramoxine 1 %-1 % rectal cream  USE RECTALLY EVERY DAY    [provider]  triamcinolone cream (KENALOG) 0.1 % Apply 1 application topically 2 (two) times daily. 11/07/19   Peri [place], PA-C    Allergies    Codeine and Levaquin [levofloxacin]  Review of Systems   Review of Systems  All other systems reviewed and are negative.   Physical Exam Updated Vital Signs BP (!) 179/95 (BP Location: Right Arm)   Pulse 83   Temp 98.3 F (36.8 C) (Oral)   Resp 14   SpO2 100%   Physical Exam Vitals and nursing note reviewed.  Constitutional:      General: She is not in acute distress.    Appearance: Normal appearance. She is well-developed. She is not toxic-appearing.  HENT:     Head: Normocephalic and atraumatic.  Eyes:     General: Lids are normal.     Conjunctiva/sclera: Conjunctivae normal.     Pupils: Pupils are equal, round, and reactive to light.  Neck:     Thyroid:  No thyroid mass.     Trachea: No tracheal deviation.  Cardiovascular:     Rate and Rhythm: Normal rate and regular rhythm.     Heart sounds: Normal heart sounds. No murmur heard.  No gallop.   Pulmonary:     Effort: Pulmonary effort is normal. No respiratory distress.     Breath sounds: Normal breath sounds. No stridor. No decreased breath sounds, wheezing, rhonchi or rales.  Chest:    Abdominal:     General: Bowel sounds are normal. There is no distension.     Palpations: Abdomen is soft.     Tenderness: There is no abdominal  tenderness. There is no rebound.  Musculoskeletal:        General: No tenderness. Normal range of motion.     Cervical back: Normal range of motion and neck supple.  Skin:    General: Skin is warm and dry.     Findings: No abrasion or rash.  Neurological:     Mental Status: She is alert and oriented to person, place, and time.     GCS: GCS eye subscore is 4. GCS verbal subscore is 5. GCS motor subscore is 6.     Cranial Nerves: No cranial nerve deficit.     Sensory: No sensory deficit.  Psychiatric:        Speech: Speech normal.        Behavior: Behavior normal.     ED Results / Procedures / Treatments   Labs (all labs ordered are listed, but only abnormal results are displayed) Labs Reviewed  CBC WITH DIFFERENTIAL/PLATELET - Abnormal; Notable for the following components:      Result Value   RBC 3.82 (*)    Hemoglobin 11.3 (*)    HCT 35.3 (*)    All other components within normal limits  COMPREHENSIVE METABOLIC PANEL - Abnormal; Notable for the following components:   Glucose, Bld 111 (*)    GFR calc non Af Amer 59 (*)    All other components within normal limits    EKG EKG Interpretation  Date/Time:  Sunday November 16 2019 15:22:34 EDT Ventricular Rate:  76 PR Interval:    QRS Duration: 104 QT Interval:  378 QTC Calculation: 425 R Axis:   74 Text Interpretation: Sinus rhythm Right atrial enlargement Anteroseptal infarct, old Minimal ST  depression, lateral leads No significant change since last tracing Confirmed by Lorre Nick ([id number]) on 11/16/2019 4:01:31 PM   Radiology No results found.  Procedures Procedures (including critical care time)  Medications Ordered in ED Medications - No data to display  ED Course  I have reviewed the triage vital signs and the nursing notes.  Pertinent labs & imaging results that were available during my care of the patient were reviewed by me and considered in my medical decision making (see chart for details).    MDM Rules/Calculators/A&P                          Patient troponin negative here.  Chest x-ray without acute findings.  EKG without acute findings also.  Will discharge home with prescription for doxycycline Final Clinical Impression(s) / ED Diagnoses Final diagnoses:  Chest pain    Rx / DC Orders ED Discharge Orders    None       Lorre Nick, MD 11/16/19 1601

## 2019-11-16 NOTE — ED Notes (Signed)
Patient is being discharged from the Urgent Care and sent to the Emergency Department via POV . Per Selena Batten, NP, patient is in need of higher level of care due to needing further work up. Patient is aware and verbalizes understanding of plan of care.  Vitals:   11/16/19 1209  BP: (!) 166/90  Pulse: (!) 103  Resp: 16  Temp: 98.4 F (36.9 C)  SpO2: 100%

## 2019-11-16 NOTE — ED Triage Notes (Signed)
Pt reports several tick bites this year, seen at [facility] today for CP last night. Pt reports CP started yesterday after pul;ling back of mulch around. UC wanted lab work completed due to multiple tick bites. Pt denies active CP

## 2019-11-16 NOTE — ED Notes (Signed)
Pt transported to XR.  

## 2019-12-31 ENCOUNTER — Other Ambulatory Visit: Payer: Self-pay | Admitting: *Deleted

## 2019-12-31 DIAGNOSIS — M25532 Pain in left wrist: Secondary | ICD-10-CM

## 2020-01-14 ENCOUNTER — Encounter: Payer: Self-pay | Admitting: Vascular Surgery

## 2020-01-14 ENCOUNTER — Ambulatory Visit (INDEPENDENT_AMBULATORY_CARE_PROVIDER_SITE_OTHER): Payer: Medicare Other | Admitting: Vascular Surgery

## 2020-01-14 ENCOUNTER — Ambulatory Visit (HOSPITAL_COMMUNITY)
Admission: RE | Admit: 2020-01-14 | Discharge: 2020-01-14 | Disposition: A | Discharge status: Home / Self Care | Payer: Medicare Other | Source: Ambulatory Visit | Attending: Vascular Surgery | Admitting: Vascular Surgery

## 2020-01-14 ENCOUNTER — Other Ambulatory Visit: Payer: Self-pay

## 2020-01-14 VITALS — BP 210/110 | HR 78 | Temp 98.1°F | Resp 20 | Ht 62.0 in | Wt 104.0 lb

## 2020-01-14 DIAGNOSIS — I721 Aneurysm of artery of upper extremity: Secondary | ICD-10-CM | POA: Diagnosis not present

## 2020-01-14 DIAGNOSIS — M25532 Pain in left wrist: Secondary | ICD-10-CM | POA: Diagnosis not present

## 2020-01-14 NOTE — Progress Notes (Signed)
REASON FOR CONSULT:    Possible left radial artery dissection.  The consult is requested by Dr. Greggory Stallion  ASSESSMENT & PLAN:   TORTUOSITY LEFT RADIAL ARTERY: Based on my review of the duplex scan and my physical exam I think the patient simply has some tortuosity of her left radial artery.  She has excellent flow and I do not see any evidence of dissection or aneurysm.  I do not think any further vascular work-up is indicated unless this enlarge significantly.  I had be happy to see her back anytime for any new vascular issues arise.  HYPERTENSION: The patient's initial blood pressure today was elevated. We repeated this and this was still elevated. We have encouraged the patient to follow up with their primary care physician for management of their blood pressure.  Waverly Ferrari, MD Office: [phone]   HPI:   [name] is a pleasant 74 y.o. female, who had a cyst removed from her left wrist in approximately 2009.  She had noticed some slight swelling in her left wrist and was seen by orthopedics.  They did a limited duplex which suggested possibly a dissection of the left radial artery and for this reason vascular surgery was consulted.  He does not remember any specific injury to her wrist except for way back in 2009 when had an issue with her riding mower and jammed her wrist.  Soon after this she had developed a cyst which was removed.  She denies pain or paresthesias in the left upper extremity.  Past Medical History:  Diagnosis Date  . Acute pharyngitis   . Acute sinusitis, unspecified   . Allergic rhinitis, cause unspecified   . Allergy    seasonal  . Asthma   . Chronic airway obstruction, not elsewhere classified   . Dysfunction of eustachian tube   . Esophageal reflux   . Hemorrhoids   . Hiatal hernia   . Hypertension   . IBS (irritable bowel syndrome)   . Osteopenia   . Personal history of colonic polyps   . Schatzki's ring   . Sleep apnea   .  Unspecified asthma(493.90)   . Unspecified gastritis and gastroduodenitis without mention of hemorrhage     Family History  Problem Relation Age of Onset  . Diabetes Sister   . Allergies Sister   . Alzheimer's disease Father   . Allergies Brother   . Allergies Brother   . Asthma Maternal Grandfather   . Colon cancer Neg Hx     SOCIAL HISTORY: Social History   Socioeconomic History  . Marital status: Widowed    Spouse name: Not on file  . Number of children: Not on file  . Years of education: Not on file  . Highest education level: Not on file  Occupational History  . Occupation: Retired    Comment: Designer, industrial/product for [employer]  . Smoking status: Never Smoker  . Smokeless tobacco: Never Used  Vaping Use  . Vaping Use: Never used  Substance and Sexual Activity  . Alcohol use: No    Alcohol/week: 0.0 standard drinks  . Drug use: No  . Sexual activity: Not on file  Other Topics Concern  . Not on file  Social History Narrative  . Not on file   Social Determinants of Health   Financial Resource Strain:   . Difficulty of Paying Living Expenses:   Food Insecurity:   . Worried About Programme researcher, broadcasting/film/video in the Last Year:   .  Ran Out of Food in the Last Year:   Transportation Needs:   . Freight forwarder (Medical):   Marland Kitchen Lack of Transportation (Non-Medical):   Physical Activity:   . Days of Exercise per Week:   . Minutes of Exercise per Session:   Stress:   . Feeling of Stress :   Social Connections:   . Frequency of Communication with Friends and Family:   . Frequency of Social Gatherings with Friends and Family:   . Attends Religious Services:   . Active Member of Clubs or Organizations:   . Attends Banker Meetings:   Marland Kitchen Marital Status:   Intimate Partner Violence:   . Fear of Current or Ex-Partner:   . Emotionally Abused:   Marland Kitchen Physically Abused:   . Sexually Abused:     Allergies  Allergen Reactions  . Codeine  Nausea Only  . Levaquin [Levofloxacin] Rash and Other (See Comments)    Rash and dizziness IN HIGH DOSES-Can used 250mg  dose.       Current Outpatient Medications  Medication Sig Dispense Refill  . bismuth subsalicylate (PEPTO BISMOL) 262 MG/15ML suspension Take 30 mLs by mouth at bedtime as needed for indigestion or diarrhea or loose stools.     . Calcium Carbonate (CALCIUM 500 PO) Take 500 mg by mouth at bedtime.    . Calcium Carbonate Antacid (TUMS PO) Take 1 tablet by mouth daily as needed (indigestion/heartburn).    denosumab (PROLIA) 60 MG/ML SOLN injection Inject 60 mg into the skin every 6 (six) months.     . fluticasone (FLONASE) 50 MCG/ACT nasal spray Place 2 sprays into both nostrils daily as needed for allergies or rhinitis. 16 g 12  . loperamide (IMODIUM) 2 MG capsule Take 2 mg by mouth daily as needed for diarrhea or loose stools.     Marland Kitchen losartan (COZAAR) 50 MG tablet Take 25 mg by mouth at bedtime.     . Multiple Vitamin (MULTIVITAMIN WITH MINERALS) TABS Take 1 tablet by mouth at bedtime.     . pantoprazole (PROTONIX) 40 MG tablet TAKE 1 TABLET BY MOUTH  DAILY (Patient taking differently: Take 40 mg by mouth daily. ) 90 tablet 0   No current facility-administered medications for this visit.    REVIEW OF SYSTEMS:  [X]  denotes positive finding, [ ]  denotes negative finding Cardiac  Comments:  Chest pain or chest pressure:    Shortness of breath upon exertion:    Short of breath when lying flat:    Irregular heart rhythm:        Vascular    Pain in calf, thigh, or hip brought on by ambulation:    Pain in feet at night that wakes you up from your sleep:     Blood clot in your veins:    Leg swelling:         Pulmonary    Oxygen at home:    Productive cough:     Wheezing:         Neurologic    Sudden weakness in arms or legs:     Sudden numbness in arms or legs:     Sudden onset of difficulty speaking or slurred speech:    Temporary loss of vision in one eye:      Problems with dizziness:         Gastrointestinal    Blood in stool:     Vomited blood:         Genitourinary  Burning when urinating:     Blood in urine:        Psychiatric    Major depression:         Hematologic    Bleeding problems:    Problems with blood clotting too easily:        Skin    Rashes or ulcers:        Constitutional    Fever or chills:     PHYSICAL EXAM:   Vitals:   01/14/20 0958  BP: (!) 210/110  Pulse: 78  Resp: 20  Temp: 98.1 F (36.7 C)  SpO2: 97%  Weight: 47.2 kg  Height: 5\' 2"  (1.575 m)    GENERAL: The patient is a well-nourished female, in no acute distress. The vital signs are documented above. CARDIAC: There is a regular rate and rhythm.  VASCULAR: I do not detect carotid bruits. He has palpable brachial and radial pulses bilaterally. He has palpable pedal pulses bilaterally. PULMONARY: There is good air exchange bilaterally without wheezing or rales. ABDOMEN: Soft and non-tender with normal pitched bowel sounds.  I do not palpate an abdominal aortic aneurysm. MUSCULOSKELETAL: There are no major deformities or cyanosis. NEUROLOGIC: No focal weakness or paresthesias are detected. SKIN: There are no ulcers or rashes noted. PSYCHIATRIC: The patient has a normal affect.  DATA:    DUPLEX LEFT UPPER EXTREMITY: I have independently interpreted her duplex of the left upper extremity.  The patient is noted to have triphasic flow in the subclavian, axillary, brachial, radial, and ulnar arteries.  I looked at the images and did not see any clear-cut evidence of a dissection.  It looks like the artery was slightly tortuous at this level or perhaps the artery was slightly ectatic here.

## 2020-01-19 ENCOUNTER — Telehealth: Payer: Self-pay | Admitting: Internal Medicine

## 2020-01-19 MED ORDER — AZITHROMYCIN 250 MG PO TABS: ORAL_TABLET | ORAL | 0 refills | Status: DC

## 2020-01-19 NOTE — Telephone Encounter (Signed)
Ok to send Zpak     250 mg, # 6,  2 today then one daily 

## 2020-01-19 NOTE — Telephone Encounter (Signed)
Called and spoke with patient letting her know that Dr. Maple Hudson wants [place] to send in a Zpak for her. She verified pharmacy. RX has been sent. Nothing further needed at this time.

## 2020-01-19 NOTE — Telephone Encounter (Signed)
Primary Pulmonologist: Dr. Maple Hudson Last office visit and with whom: 05/26/19 Dr. Maple Hudson What do we see them for (pulmonary problems): OSA, Asthma Last OV assessment/plan: See below  Was appointment offered to patient (explain)?  No   Reason for call: Called and spoke with patient she states that around this time of year she normally gets sinus infections. States that the other weekend she was raking leaves. Sinus pressure, sneezing, runny nose denies fever, not coughing anything up, when blows nose its clear. Dr. Maple Hudson please advise  (examples of things to ask: : When did symptoms start? Fever? Cough? Productive? Color to sputum? More sputum than usual? Wheezing? Have you needed increased oxygen? Are you taking your respiratory medications? What over the counter measures have you tried?)  Allergies  Allergen Reactions  . Codeine Nausea Only  . Levaquin [Levofloxacin] Rash and Other (See Comments)    Rash and dizziness IN HIGH DOSES-Can used 250mg  dose.       Immunization History  Administered Date(s) Administered  . Fluad Quad(high Dose 65+) 02/27/2019  . H1N1 06/17/2008  . Influenza Split 02/27/2011, 02/19/2012  . Influenza Whole 02/13/2008, 02/26/2010  . Influenza, High Dose Seasonal PF 02/29/2016, 03/04/2018  . Influenza,inj,Quad PF,6+ Mos 02/11/2013, 02/16/2015  . PFIZER SARS-COV-2 Vaccination 07/21/2019, 08/12/2019  . Pneumococcal Conjugate-13 05/19/2015  . Pneumococcal-Unspecified 05/11/2013  . Td 10/20/2012  . Zoster Recombinat (Shingrix) 06/20/2018    Assessment & Plan Note by 06/22/2018, MD at 05/27/2019 9:22 PM Author: 05/29/2019, MD Author Type: Physician Filed: 05/27/2019 9:22 PM  Note Status: Written Cosign: Cosign Not Required Encounter Date: 05/26/2019  Problem: Seasonal and perennial allergic rhinitis  Editor: 05/28/2019, MD (Physician)               Manage flonase, decongestants Zpak use discussed.     Assessment & Plan Note by Waymon Budge, MD at 05/27/2019 9:21 PM Author: 05/29/2019, MD Author Type: Physician Filed: 05/27/2019 9:22 PM  Note Status: Written Cosign: Cosign Not Required Encounter Date: 05/26/2019  Problem: Asthma, mild intermittent  Editor: 05/28/2019, MD (Physician)               Does own yard work. Outside a lot. Plan- refill rescue inhaler    Assessment & Plan Note by Waymon Budge, MD at 05/27/2019 9:20 PM Author: 05/29/2019, MD Author Type: Physician Filed: 05/27/2019 9:20 PM  Note Status: Written Cosign: Cosign Not Required Encounter Date: 05/26/2019  Problem: Obstructive sleep apnea  Editor: 05/28/2019, MD (Physician)               Say s nasal congestion interferes with nasal mask use. Discussed mask options. Continue auto 5-10    Patient Instructions by Waymon Budge, MD at 05/26/2019 3:30 PM Author: 05/28/2019, MD Author Type: Physician Filed: 05/26/2019 3:52 PM  Note Status: Addendum 05/28/2019: Cosign Not Required Encounter Date: 05/26/2019  Editor: 05/28/2019, MD (Physician)      Prior Versions: 1. Waymon Budge, MD (Physician) at 05/26/2019 3:48 PM - Signed

## 2020-01-29 ENCOUNTER — Telehealth: Payer: Self-pay | Admitting: Internal Medicine

## 2020-01-29 DIAGNOSIS — J3089 Other allergic rhinitis: Secondary | ICD-10-CM

## 2020-01-30 MED ORDER — FLUTICASONE PROPIONATE 50 MCG/ACT NA SUSP: 2.0000 | Freq: Every day | NASAL | 12 refills | Status: DC | PRN

## 2020-01-30 NOTE — Telephone Encounter (Signed)
LMOM  For pt that refills for [facility] were sent to pharmacy requested. Nothing further needed.

## 2020-02-04 ENCOUNTER — Telehealth: Payer: Self-pay | Admitting: Internal Medicine

## 2020-02-05 NOTE — Telephone Encounter (Signed)
Called and spoke with pt who stated she received information that her cpap machine is being recalled. Pt had called [facility] to see what might need to be done and she was told that there were a lot of machines that are being recalled.  Pt stated that she stopped using her machine when she found out the machine was being recalled as she did not want to be breathing in the material that was stated in the recall. Pt stated it has been about a week since she last used her machine.  Asked pt if her machine has a filter and she said there is one filter that is replaced every 2 weeks as it is disposable filter. There is another filter that pt states she takes out to clean.  Pt wants to know what needs to be done in regards to the recall. Dr. Maple Hudson, please advise.

## 2020-02-05 NOTE — Telephone Encounter (Signed)
She can ask her DME [facility] if they can replace her CPAP machine since it has been recalled.  She may need to call her insurance to ask if they will cover replacement of her machine.  She can look on-line, such as CPAP.com, for a replacement machine such as an AirSense 10 Auto machine, to see if she would want to self-pay to get one without insurance coverage.

## 2020-02-05 NOTE — Telephone Encounter (Signed)
Called and spoke with pt letting her know the info stated by CY and she verbalized understanding. Nothing further needed. 

## 2020-02-05 NOTE — Telephone Encounter (Signed)
Called pt but unable to reach. Left message for her to return call. 

## 2020-02-05 NOTE — Telephone Encounter (Signed)
Pt returned a phone call. Pt can be reached at [phone].

## 2020-02-06 ENCOUNTER — Telehealth: Payer: Self-pay | Admitting: Internal Medicine

## 2020-02-06 DIAGNOSIS — G4733 Obstructive sleep apnea (adult) (pediatric): Secondary | ICD-10-CM

## 2020-02-06 NOTE — Telephone Encounter (Signed)
Spoke with patient. She stated that she took Dr. Roxy Cedar advice and called both [facility] and her insurance. [employer] stated that they will pay for a new machine as long as an order was placed stating that she needed a cpap machine. She also stated that [facility] stated that they will process the order as soon as they receive the order. She called [employer] to see if she could be reimbursed but has not heard anything yet.    Dr. Maple Hudson, please advise if you are ok with [place] placing an order for a new cpap machine. Thanks!

## 2020-02-08 NOTE — Telephone Encounter (Signed)
Please order replacement for her recalled CPAP machine, same settings, mask of choice, humidifier, supplies, [employer]/ card

## 2020-02-09 NOTE — Telephone Encounter (Signed)
Attempted to call patient, unable to leave a message. DME order sent for replacement CPAP machine to [facility].

## 2020-05-11 ENCOUNTER — Encounter: Payer: Self-pay | Admitting: Internal Medicine

## 2020-05-11 ENCOUNTER — Telehealth: Payer: Self-pay | Admitting: Internal Medicine

## 2020-05-11 MED ORDER — AZITHROMYCIN 250 MG PO TABS: ORAL_TABLET | ORAL | 0 refills | Status: DC

## 2020-05-11 NOTE — Telephone Encounter (Signed)
05/11/20   Contacted the patient via triage per protocol.  Patient reporting that she was outside for significant amount of time on 12/12 for-2021.  Around a lot of leaves as well as damp leaves.  She reports that since 04/30/2020 she is had increased clear sinus drainage, postnasal drip, sore throat as well as increased sinus pressure.  She reports she is been checking her temperature and she remains afebrile.  She can smell and taste okay.  She denies any recent sick contacts.  She has been using her Flonase.  She is not taking a daily antihistamine.  Patient feels that she has a sinus infection.  She reports that she is very prone to these and typically this is treated in the past with azithromycin/Z-Pak by Dr. Maple Hudson.  We will route message to Dr. Maple Hudson for his recommendations.  Dr. Maple Hudson please advise.  Elisha Headland, FNP

## 2020-05-11 NOTE — Telephone Encounter (Signed)
Routing to triage for follow up   Paddy Neis FNP  

## 2020-05-11 NOTE — Telephone Encounter (Signed)
ATC patient to let her know that RX for Zpak was going to be sent into pharamcy. Per DPR left detailed message, sent RX to preferred pharmacy in patients chart. Nothing further needed at this time.

## 2020-05-11 NOTE — Telephone Encounter (Signed)
Please send  Zpak 250 mg, # 6, 2 today then one daily ?

## 2020-05-25 NOTE — Progress Notes (Signed)
HPI female never smoker followed for OSA,Allergic rhinitis, asthma, complicated by ulcerative colitis PFT 2011 mild obstruction with air trapping PFT 06/21/16-moderate obstructive airways disease insignificant response to bronchodilator diffusion mildly reduced. FVC 1.97/71%, FEV1 1.41/68%, ratio 0.72, TLC 99%, DLCO 75% HST-04/04/17-AHI 13.5/hour, desaturation to 73%, body weight 112 pounds CT chest 06/21/16-5 mm left lower lobe pulmonary nodule- low risk -----------------------------------------------------.  05/26/2019- 74 year old female never smoker followed for OSA, Allergic rhinitis, Asthma, LLL nodule, fatigue, complicated by ulcerative colitis, GERD CPAP auto 5-10/[facility] Download indicates excellent control AHI 0.6/ hr Body weight 107 lbs Flonase,  Wants to keep spare full-face mask on hand. Nasal congestion at times interferes with nasal mask. GERD occasionally does cut breath off with cough and laryngospasm.  CXR 10/08/2018-  IMPRESSION: 1. Stable chest x-ray with hyperinflation and chronic bronchitic changes suggesting underlying COPD. 2. The 5 mm nodule reportedly in the left lower lobe on prior CT imaging is not visible by conventional x-ray.  05/26/20- 74 year old female never smoker followed for OSA, Allergic rhinitis, Asthma, LLL nodule, fatigue, complicated by ulcerative colitis, GERD CPAP auto 5-10/[facility]--- Download- Body weight today- 100 lbs Covid vax-2 Phizer Flu vax- had Not wearing CPAP since recalled. Increased watery rhinorhea. Dental xray suggested sinusitis, for which she took Zpak. Now much improved- blames "weather". We discussed and agreed to update sleep study to see if she really still needs CPAP. CXR 11/16/19- FINDINGS: Lungs are adequately inflated and otherwise clear. Cardiomediastinal silhouette and remainder of the exam is unchanged. IMPRESSION: No acute findings.   ROS-see HPI  "+" = positive Constitutional:   No-   weight loss, night  sweats, fevers, chills, + fatigue, lassitude. HEENT:   No-  headaches, difficulty swallowing, tooth/dental problems, sore throat,       No-  sneezing, itching, ear ache, +nasal congestion, +post nasal drip,  CV:  + chest pain, orthopnea, PND, swelling in lower extremities, anasarca,  dizziness, palpitations Resp: No-   shortness of breath with exertion or at rest.                 productive cough,  No non-productive cough,  No- coughing up of blood.              No-   change in color of mucus.  No- wheezing.   Skin: No-   rash or lesions. GI:  + heartburn, indigestion, abdominal pain, nausea, vomiting,  GU:  MS:  No-   joint pain or swelling.   Neuro-     nothing unusual Psych:  No- change in mood or affect. No depression or anxiety.  No memory loss.  OBJ- Physical Exam General- Alert, Oriented, Affect-appropriate, Distress- none acute, + trim and alert Skin- rash-none, lesions- none, excoriation- none Lymphadenopathy- none Head- atraumatic            Eyes- Gross vision intact, PERRLA, conjunctivae and secretions clear            Ears- Hearing, canals-normal            Nose-  +Mild turbinate edema, +Septal dev, no-mucus, polyps,  erosions             Throat- Mallampati IV , mucosa clear , drainage- none, tonsils- atrophic Neck- flexible , trachea midline, no stridor , thyroid nl, carotid no bruit Chest - symmetrical excursion , unlabored           Heart/CV- RRR , no murmur , no gallop  , no rub, nl s1 s2                           -  JVD- none , edema- none, stasis changes- none, varices- none           Lung- clear, wheeze- none, cough- minimal , dullness-none, rub- none           Chest wall-  Abd-  Br/ Gen/ Rectal- Not done, not indicated Extrem- cyanosis- none, clubbing, none, atrophy- none, strength- nl Neuro- grossly intact to observation

## 2020-05-26 ENCOUNTER — Encounter: Payer: Self-pay | Admitting: Internal Medicine

## 2020-05-26 ENCOUNTER — Other Ambulatory Visit: Payer: Self-pay

## 2020-05-26 ENCOUNTER — Ambulatory Visit (INDEPENDENT_AMBULATORY_CARE_PROVIDER_SITE_OTHER): Payer: Medicare Other | Admitting: Internal Medicine

## 2020-05-26 DIAGNOSIS — G4733 Obstructive sleep apnea (adult) (pediatric): Secondary | ICD-10-CM

## 2020-05-26 DIAGNOSIS — J302 Other seasonal allergic rhinitis: Secondary | ICD-10-CM | POA: Diagnosis not present

## 2020-05-26 DIAGNOSIS — J3089 Other allergic rhinitis: Secondary | ICD-10-CM

## 2020-05-26 NOTE — Patient Instructions (Signed)
Ok to use Flonase 1 puff each nostril twice daily while needed  Ok to use an otc antihistamine like claritin, allegra or zyrtec, as needed to help with runny nose and drainage  Order- schedule home sleep test-  Dx OSA You will do this test off CPAP to see how you are doing now. You may no longer need CPAP since you have lost weight.  Please call us for results about 2 weeks after your sleep test.  Please call if we can help

## 2020-07-15 NOTE — Assessment & Plan Note (Signed)
We discussed use of Flonase and antihistamines.

## 2020-07-15 NOTE — Assessment & Plan Note (Signed)
She's very petite. Not noticing daytime sleepiness since her CPAP machine was recalled. Plan- update sleep study off CPAP

## 2020-07-21 ENCOUNTER — Telehealth: Payer: Self-pay | Admitting: Internal Medicine

## 2020-07-21 DIAGNOSIS — G4733 Obstructive sleep apnea (adult) (pediatric): Secondary | ICD-10-CM

## 2020-07-21 NOTE — Telephone Encounter (Signed)
Spoke with patient. She stated that she was seen by Dr. Maple Hudson back in December 2021 and he wanted her to have another HST to be done before her next appt. She is scheduled to see CY next month but the HST was never ordered. I advised her that I would go ahead and place the order for the HST. She is aware of this.   Nothing further needed at time of call.

## 2020-07-23 ENCOUNTER — Encounter: Payer: Self-pay | Admitting: Internal Medicine

## 2020-08-04 ENCOUNTER — Telehealth: Payer: Self-pay | Admitting: Internal Medicine

## 2020-08-04 NOTE — Telephone Encounter (Signed)
Noted. Will route message to [facility] pool to see if they have an estimate of when she can be scheduled for HST before contacting patient, in case they can go ahead and call her and get her scheduled.   [facility] ladies please advise. Thanks :)

## 2020-08-04 NOTE — Telephone Encounter (Signed)
Pt is scheduled w/ me 3/17 @ 3.  Pt stated nothing further needed at this time.

## 2020-08-04 NOTE — Telephone Encounter (Signed)
Pt is calling in regards to having another HST to be done before her next appt. She is scheduled to see Dr. Maple Hudson on (03/29) but the HST was never ordered.Looks like [name] ordered one back on (02/23) but she hasnt received any information in regards to it since then. Pls regard [phone].

## 2020-08-04 NOTE — Telephone Encounter (Signed)
According to the note in pt's chart, I left a message for pt to call me to schedule the HST on 3/7.  I will reach back out to the patient again.

## 2020-08-12 ENCOUNTER — Ambulatory Visit: Payer: Medicare Other

## 2020-08-12 ENCOUNTER — Other Ambulatory Visit: Payer: Self-pay

## 2020-08-12 DIAGNOSIS — G4733 Obstructive sleep apnea (adult) (pediatric): Secondary | ICD-10-CM

## 2020-08-17 DIAGNOSIS — G4733 Obstructive sleep apnea (adult) (pediatric): Secondary | ICD-10-CM

## 2020-08-23 NOTE — Progress Notes (Signed)
HPI female never smoker followed for OSA,Allergic rhinitis, asthma, complicated by ulcerative colitis PFT 2011 mild obstruction with air trapping PFT 06/21/16-moderate obstructive airways disease insignificant response to bronchodilator diffusion mildly reduced. FVC 1.97/71%, FEV1 1.41/68%, ratio 0.72, TLC 99%, DLCO 75% HST-04/04/17-AHI 13.5/hour, desaturation to 73%, body weight 112 pounds CT chest 06/21/16-5 mm left lower lobe pulmonary nodule- low risk -----------------------------------------------------.   05/26/20- 75 year old female never smoker followed for OSA, Allergic rhinitis, Asthma, LLL nodule, fatigue, complicated by ulcerative colitis, GERD CPAP auto 5-10/[employer]--- Download- Body weight today- 100 lbs Covid vax-2 Phizer Flu vax- had Not wearing CPAP since recalled. Increased watery rhinorhea. Dental xray suggested sinusitis, for which she took Zpak. Now much improved- blames "weather". We discussed and agreed to update sleep study to see if she really still needs CPAP. CXR 11/16/19- FINDINGS: Lungs are adequately inflated and otherwise clear. Cardiomediastinal silhouette and remainder of the exam is unchanged. IMPRESSION: No acute findings.  08/24/20- 75 year old female never smoker followed for OSA, Allergic rhinitis, Asthma, LLL nodule, fatigue, complicated by ulcerative colitis, GERD HST 08/12/20- AHI 4.2/ hr, desaturation to 77%, body weight 100 lbs CPAP auto 5-10/[facility]/[employer] Download- Body weight today-103 lbs Covid vax-3 Phizer Flu vax-had Discussed sleep study. No longer needs CPAP- she is pleased. Breathing is comfortable, without early Spring pollen concerns yet. Pending cataract surgery.  ROS-see HPI  "+" = positive Constitutional:   No-   weight loss, night sweats, fevers, chills, + fatigue, lassitude. HEENT:   No-  headaches, difficulty swallowing, tooth/dental problems, sore throat,       No-  sneezing, itching, ear ache, +nasal congestion, +post  nasal drip,  CV:  + chest pain, orthopnea, PND, swelling in lower extremities, anasarca,  dizziness, palpitations Resp: No-   shortness of breath with exertion or at rest.                 productive cough,  No non-productive cough,  No- coughing up of blood.              No-   change in color of mucus.  No- wheezing.   Skin: No-   rash or lesions. GI:  + heartburn, indigestion, abdominal pain, nausea, vomiting,  GU:  MS:  No-   joint pain or swelling.   Neuro-     nothing unusual Psych:  No- change in mood or affect. No depression or anxiety.  No memory loss.  OBJ- Physical Exam General- Alert, Oriented, Affect-appropriate, Distress- none acute, + trim and alert Skin- rash-none, lesions- none, excoriation- none Lymphadenopathy- none Head- atraumatic            Eyes- Gross vision intact, PERRLA, conjunctivae and secretions clear            Ears- Hearing, canals-normal            Nose-  +Mild turbinate edema, +Septal dev, no-mucus, polyps,  erosions             Throat- Mallampati IV , mucosa clear , drainage- none, tonsils- atrophic Neck- flexible , trachea midline, no stridor , thyroid nl, carotid no bruit Chest - symmetrical excursion , unlabored           Heart/CV- RRR , no murmur , no gallop  , no rub, nl s1 s2                           - JVD- none , edema- none, stasis changes- none, varices-  none           Lung- clear, wheeze- none, cough- minimal , dullness-none, rub- none           Chest wall-  Abd-  Br/ Gen/ Rectal- Not done, not indicated Extrem- cyanosis- none, clubbing, none, atrophy- none, strength- nl Neuro- grossly intact to observation

## 2020-08-24 ENCOUNTER — Ambulatory Visit (INDEPENDENT_AMBULATORY_CARE_PROVIDER_SITE_OTHER): Payer: Medicare Other | Admitting: Internal Medicine

## 2020-08-24 ENCOUNTER — Encounter: Payer: Self-pay | Admitting: Internal Medicine

## 2020-08-24 ENCOUNTER — Other Ambulatory Visit: Payer: Self-pay

## 2020-08-24 VITALS — BP 130/82 | HR 72 | Temp 97.3°F | Ht 62.0 in | Wt 103.8 lb

## 2020-08-24 DIAGNOSIS — J449 Chronic obstructive pulmonary disease, unspecified: Secondary | ICD-10-CM | POA: Diagnosis not present

## 2020-08-24 DIAGNOSIS — G4733 Obstructive sleep apnea (adult) (pediatric): Secondary | ICD-10-CM

## 2020-08-24 DIAGNOSIS — J302 Other seasonal allergic rhinitis: Secondary | ICD-10-CM

## 2020-08-24 DIAGNOSIS — J3089 Other allergic rhinitis: Secondary | ICD-10-CM

## 2020-08-24 NOTE — Assessment & Plan Note (Signed)
Hx passive smoke exposure from family growing up, past asthma. No stable and comfortable without inhalers

## 2020-08-24 NOTE — Assessment & Plan Note (Signed)
Has long palate, but with 12 lb weight loss since last study, sleeping alone with no report of snoring and no somnolence, we will dc CPAP. Discussed role of weight. Plan- dc CPAP

## 2020-08-24 NOTE — Patient Instructions (Signed)
Order- DME [employer]- d/c CPAP  Please call if we can help

## 2020-08-24 NOTE — Assessment & Plan Note (Signed)
Will use flonase and antihistamines if needed

## 2020-09-06 ENCOUNTER — Ambulatory Visit (INDEPENDENT_AMBULATORY_CARE_PROVIDER_SITE_OTHER): Payer: Medicare Other | Admitting: Internal Medicine

## 2020-09-06 ENCOUNTER — Ambulatory Visit: Payer: Medicare Other | Admitting: Internal Medicine

## 2020-09-06 ENCOUNTER — Encounter: Payer: Self-pay | Admitting: Internal Medicine

## 2020-09-06 VITALS — BP 170/100 | HR 88 | Ht 60.25 in | Wt 103.0 lb

## 2020-09-06 DIAGNOSIS — K222 Esophageal obstruction: Secondary | ICD-10-CM

## 2020-09-06 DIAGNOSIS — R1013 Epigastric pain: Secondary | ICD-10-CM | POA: Diagnosis not present

## 2020-09-06 DIAGNOSIS — R1319 Other dysphagia: Secondary | ICD-10-CM

## 2020-09-06 DIAGNOSIS — Z1211 Encounter for screening for malignant neoplasm of colon: Secondary | ICD-10-CM | POA: Diagnosis not present

## 2020-09-06 DIAGNOSIS — K219 Gastro-esophageal reflux disease without esophagitis: Secondary | ICD-10-CM | POA: Diagnosis not present

## 2020-09-06 DIAGNOSIS — K580 Irritable bowel syndrome with diarrhea: Secondary | ICD-10-CM | POA: Diagnosis not present

## 2020-09-06 MED ORDER — PLENVU 140 G PO SOLR: 1.0000 | Freq: Once | ORAL | 0 refills | Status: AC

## 2020-09-06 NOTE — Progress Notes (Signed)
HISTORY OF PRESENT ILLNESS:  [name] is a 75 y.o. female, former patient of Dr. Sheryn Bison with multiple medical problems as listed below.  She presents today regarding GERD with breakthrough symptoms, mild dysphagia, ongoing issues with chronic diarrhea, and the need for follow-up screening colonoscopy.  The patient last underwent routine screening colonoscopy December 2011.  Examination was normal.  Random colon biopsies to evaluate diarrhea were normal.  Upper endoscopy at that time was also performed.  Duodenal biopsies were normal.  Patient underwent upper endoscopy in May 2016 to evaluate epigastric pain.  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  She was placed on pantoprazole 40 mg daily.  She tells me that she has continued on pantoprazole.  She takes this in the morning 30 minutes before meals.  Despite this she will have breakthrough reflux symptoms on occasion.  Typically with dietary indiscretion.  She also describes some mild esophageal dysphagia.  She reports a family history of esophageal cancer and is interested in upper endoscopy.  Next, she does continue to have intermittent problems with diarrhea.  She takes antidiarrheal such as Imodium as needed.  Review of outside records shows a hemoglobin of 11.5.  Normal liver test.  MRI of the abdomen/MRCP was performed in March 2017.  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  No further imaging recommended.  Patient has completed her COVID vaccination series.  She is anticipating cataract surgery in the near future and wishes to have her endoscopic examinations performed in June.  REVIEW OF SYSTEMS:  All non-GI ROS negative unless otherwise stated in the HPI except for visual change, sinus allergy trouble  Past Medical History:  Diagnosis Date  . Acute pharyngitis   . Acute sinusitis, unspecified   . Allergic rhinitis, cause unspecified   . Allergy    seasonal  . Asthma    . Chronic airway obstruction, not elsewhere classified   . Dysfunction of eustachian tube   . Esophageal reflux   . Hemorrhoids   . Hiatal hernia   . Hypertension   . IBS (irritable bowel syndrome)   . Osteopenia   . Personal history of colonic polyps   . Schatzki's ring   . Sleep apnea   . Unspecified asthma(493.90)   . Unspecified gastritis and gastroduodenitis without mention of hemorrhage     Past Surgical History:  Procedure Laterality Date  . ABDOMINAL HYSTERECTOMY    . CYST REMOVAL HAND    . FOOT SURGERY    . LAPAROSCOPIC HYSTERECTOMY    . PARTIAL MASTECTOMY WITH NEEDLE LOCALIZATION Left 08/27/2012   Procedure: PARTIAL MASTECTOMY WITH NEEDLE LOCALIZATION;  Surgeon: Ernestene Mention, MD;  Location: [facility] OR;  Service: General;  Laterality: Left;  . TONSILLECTOMY      Social History [name]  reports that she has never smoked. She has never used smokeless tobacco. She reports that she does not drink alcohol and does not use drugs.  family history includes Allergies in her brother, brother, and sister; Alzheimer's disease in her father; Asthma in her maternal grandfather; Diabetes in her sister.  Allergies  Allergen Reactions  . Codeine Nausea Only  . Levaquin [Levofloxacin] Rash and Other (See Comments)    Rash and dizziness IN HIGH DOSES-Can used 250mg  dose.          PHYSICAL EXAMINATION: Vital signs: BP (!) 170/100 (BP Location: Left Arm, Patient Position: Sitting, Cuff Size: Normal)   Pulse 88  Ht 5' 0.25" (1.53 m) Comment: height measured without shoes  Wt 103 lb (46.7 kg)   BMI 19.95 kg/m   Constitutional: Thin, generally well-appearing, no acute distress Psychiatric: alert and oriented x3, cooperative Eyes: extraocular movements intact, anicteric, conjunctiva pink Mouth: oral pharynx moist, no lesions Neck: supple no lymphadenopathy Cardiovascular: heart regular rate and rhythm, no murmur Lungs: clear to auscultation bilaterally Abdomen: soft,  nontender, nondistended, no obvious ascites, no peritoneal signs, normal bowel sounds, no organomegaly Rectal: Deferred until colonoscopy Extremities: no clubbing, cyanosis, or lower extremity edema bilaterally Skin: no lesions on visible extremities Neuro: No focal deficits.  Cranial nerves intact  ASSESSMENT:  1.  Chronic GERD.  Breakthrough symptoms on pantoprazole 40 mg daily 2.  Vague esophageal dysphagia.  Possibly secondary to known esophageal ring 3.  Colon cancer screening.  Baseline risk.  Last examination 2011 4.  Diarrhea predominant irritable bowel syndrome.  Ongoing   PLAN:  1.  Reflux precautions 2.  Continue pantoprazole 40 mg daily.  Medication risks reviewed 3.  Upper endoscopy with biopsies and possible esophageal dilation.The nature of the procedure, as well as the risks, benefits, and alternatives were carefully and thoroughly reviewed with the patient. Ample time for discussion and questions allowed. The patient understood, was satisfied, and agreed to proceed. 4.  Colonoscopy with biopsies and polypectomy if necessary.The nature of the procedure, as well as the risks, benefits, and alternatives were carefully and thoroughly reviewed with the patient. Ample time for discussion and questions allowed. The patient understood, was satisfied, and agreed to proceed.

## 2020-09-06 NOTE — Patient Instructions (Signed)
You have been scheduled for an endoscopy and colonoscopy. Please follow the written instructions given to you at your visit today. Please pick up your prep supplies at the pharmacy within the next 1-3 days. If you use inhalers (even only as needed), please bring them with you on the day of your procedure.  

## 2020-11-30 ENCOUNTER — Telehealth: Payer: Self-pay | Admitting: Internal Medicine

## 2020-11-30 MED ORDER — AZITHROMYCIN 250 MG PO TABS: ORAL_TABLET | ORAL | 0 refills | Status: AC

## 2020-11-30 NOTE — Telephone Encounter (Signed)
Spoke to patient, who is requesting zpak for sinus infection.  C/o sinus drainage, sinus pressure, dry cough and headache x2d  Sx developed after laying wet mulch without a mask.  Denied fever, chills or sweats. Fully vaccinated against covid and flu.  She is using tylenol Q4H to help with headache. Not taking any OTC meds.  Dr. Maple Hudson, please advise. Thanks  Current Outpatient Medications on File Prior to Visit  Medication Sig Dispense Refill   acyclovir cream (ZOVIRAX) 5 % 1 application to affected area 5 times a day (Patient not taking: Reported on 09/06/2020)     bismuth subsalicylate (PEPTO BISMOL) 262 MG/15ML suspension Take 30 mLs by mouth at bedtime as needed for indigestion or diarrhea or loose stools.      Calcium Carbonate (CALCIUM 500 PO) Take 500 mg by mouth at bedtime. (Patient not taking: Reported on 09/06/2020)     Calcium Carbonate Antacid (TUMS PO) Take 1 tablet by mouth daily as needed (indigestion/heartburn). (Patient not taking: Reported on 09/06/2020)     Cholecalciferol (VITAMIN D3) 50 MCG (2000 UT) capsule Take 2,000 Units by mouth daily. (Patient not taking: Reported on 09/06/2020)     denosumab (PROLIA) 60 MG/ML SOLN injection Inject 60 mg into the skin every 6 (six) months.      DUREZOL 0.05 % EMUL Place 1 drop into the right eye 3 (three) times daily. (Patient not taking: Reported on 09/06/2020)     fluticasone (FLONASE) 50 MCG/ACT nasal spray Place 2 sprays into both nostrils daily as needed for allergies or rhinitis. (Patient not taking: Reported on 09/06/2020) 16 g 12   gatifloxacin (ZYMAXID) 0.5 % SOLN INSTILL 1 DROP INTO RIGHT EYE 4 TIMES A DAY AS DIRECTED (Patient not taking: Reported on 09/06/2020)     loperamide (IMODIUM) 2 MG capsule Take 2 mg by mouth daily as needed for diarrhea or loose stools.      losartan (COZAAR) 50 MG tablet Take 50 mg by mouth at bedtime.     Multiple Vitamin (MULTIVITAMIN WITH MINERALS) TABS Take 1 tablet by mouth at bedtime.  (Patient not  taking: Reported on 09/06/2020)     pantoprazole (PROTONIX) 40 MG tablet TAKE 1 TABLET BY MOUTH  DAILY (Patient taking differently: Take 40 mg by mouth daily.) 90 tablet 0   PROLENSA 0.07 % SOLN INSTILL 1 DROP INTO RIGHT EYE EVERY EVENING AS DIRECTED (Patient not taking: Reported on 09/06/2020)     No current facility-administered medications on file prior to visit.    Allergies  Allergen Reactions   Codeine Nausea Only   Levaquin [Levofloxacin] Rash and Other (See Comments)    Rash and dizziness IN HIGH DOSES-Can used 250mg  dose.

## 2020-11-30 NOTE — Telephone Encounter (Signed)
Patient is aware of recommendations and voiced her understanding. ZPAK has been sent to preferred pharmacy.  Nothing further needed at this time.

## 2020-12-06 ENCOUNTER — Telehealth: Payer: Self-pay | Admitting: Internal Medicine

## 2020-12-06 NOTE — Telephone Encounter (Signed)
Patient called back and scheduled previsit for 02/18/2021.

## 2020-12-06 NOTE — Telephone Encounter (Signed)
LVM for pt to call back so she can scheduled for a pre visit appt.

## 2020-12-08 ENCOUNTER — Encounter: Payer: Medicare Other | Admitting: Internal Medicine

## 2021-03-04 ENCOUNTER — Encounter: Payer: Medicare Other | Admitting: Internal Medicine

## 2021-04-27 ENCOUNTER — Ambulatory Visit (AMBULATORY_SURGERY_CENTER): Payer: Medicare Other | Admitting: *Deleted

## 2021-04-27 ENCOUNTER — Other Ambulatory Visit: Payer: Self-pay

## 2021-04-27 VITALS — Ht 61.0 in | Wt 105.0 lb

## 2021-04-27 DIAGNOSIS — Z1211 Encounter for screening for malignant neoplasm of colon: Secondary | ICD-10-CM

## 2021-04-27 DIAGNOSIS — K219 Gastro-esophageal reflux disease without esophagitis: Secondary | ICD-10-CM

## 2021-04-27 DIAGNOSIS — R1310 Dysphagia, unspecified: Secondary | ICD-10-CM

## 2021-04-27 MED ORDER — ONDANSETRON HCL 4 MG PO TABS: 4.0000 mg | ORAL_TABLET | Freq: Two times a day (BID) | ORAL | 0 refills | Status: DC

## 2021-04-27 NOTE — Progress Notes (Signed)
Virtual pre visit completed over telephone. Instructions mailed to patient. Patient has Plenvu at home from a previously cancelled colonoscopy. Rx sent in for Zofran to take 30 minutes before drinking prep.    No egg or soy allergy known to patient  No issues known to pt with past sedation with any surgeries or procedures Patient denies ever being told they had issues or difficulty with intubation  No FH of Malignant Hyperthermia Pt is not on diet pills Pt is not on  home 02  Pt is not on blood thinners  Pt denies issues with constipation  No A fib or A flutter    Due to the COVID-19 pandemic we are asking patients to follow certain guidelines in [facility] and the [facility]   Pt aware of COVID protocols and [facility] guidelines

## 2021-04-28 ENCOUNTER — Encounter: Payer: Self-pay | Admitting: Internal Medicine

## 2021-05-02 ENCOUNTER — Telehealth: Payer: Self-pay | Admitting: Internal Medicine

## 2021-05-02 NOTE — Telephone Encounter (Signed)
Pt states she has not received her packet- she does not look at her My Chart  I sent her instructions in her e mail  [email or web address]- we did discuss her diet changes starting 12-9 Friday   She will print her instructions and read over those and call with further questions

## 2021-05-06 ENCOUNTER — Telehealth: Payer: Self-pay | Admitting: Internal Medicine

## 2021-05-06 NOTE — Telephone Encounter (Signed)
Pt wanted to make sure that she was having an EGD with dil and colon. Discussed with her that she is scheduled for both procedures. Note added to appt notes also for dilation.

## 2021-05-06 NOTE — Telephone Encounter (Signed)
Patient states that she thinks she's supposed to be having both Endo and Colon done on the 14th. Requesting a call back, please advise.

## 2021-05-11 ENCOUNTER — Ambulatory Visit (AMBULATORY_SURGERY_CENTER): Payer: Medicare Other | Admitting: Internal Medicine

## 2021-05-11 ENCOUNTER — Encounter: Payer: Self-pay | Admitting: Internal Medicine

## 2021-05-11 VITALS — BP 109/68 | HR 79 | Temp 97.8°F | Resp 22 | Ht 60.3 in | Wt 105.0 lb

## 2021-05-11 DIAGNOSIS — K449 Diaphragmatic hernia without obstruction or gangrene: Secondary | ICD-10-CM

## 2021-05-11 DIAGNOSIS — K529 Noninfective gastroenteritis and colitis, unspecified: Secondary | ICD-10-CM

## 2021-05-11 DIAGNOSIS — K219 Gastro-esophageal reflux disease without esophagitis: Secondary | ICD-10-CM | POA: Diagnosis not present

## 2021-05-11 DIAGNOSIS — R197 Diarrhea, unspecified: Secondary | ICD-10-CM

## 2021-05-11 DIAGNOSIS — Z1211 Encounter for screening for malignant neoplasm of colon: Secondary | ICD-10-CM

## 2021-05-11 DIAGNOSIS — K580 Irritable bowel syndrome with diarrhea: Secondary | ICD-10-CM

## 2021-05-11 DIAGNOSIS — K222 Esophageal obstruction: Secondary | ICD-10-CM

## 2021-05-11 DIAGNOSIS — R1319 Other dysphagia: Secondary | ICD-10-CM

## 2021-05-11 MED ORDER — SODIUM CHLORIDE 0.9 % IV SOLN: 500.0000 mL | Freq: Once | INTRAVENOUS | Status: DC

## 2021-05-11 NOTE — Op Note (Signed)
[facility] Patient Name: [name] Procedure Date: 05/11/2021 11:12 AM MRN: [id number] Endoscopist: Wilhemina Bonito. Marina Goodell , MD Age: 75 Referring MD:  Date of Birth: [birth year] Gender: Female Account #: [id number] Procedure:                Upper GI endoscopy with esophageal dilation. 82                            [place] Indications:              Dysphagia, Therapeutic procedure, Esophageal reflux Medicines:                Monitored Anesthesia Care Procedure:                Pre-Anesthesia Assessment:                           - Prior to the procedure, a History and Physical                            was performed, and patient medications and                            allergies were reviewed. The patient's tolerance of                            previous anesthesia was also reviewed. The risks                            and benefits of the procedure and the sedation                            options and risks were discussed with the patient.                            All questions were answered, and informed consent                            was obtained. Prior Anticoagulants: The patient has                            taken no previous anticoagulant or antiplatelet                            agents. ASA Grade Assessment: II - A patient with                            mild systemic disease. After reviewing the risks                            and benefits, the patient was deemed in                            satisfactory condition to undergo the procedure.  After obtaining informed consent, the endoscope was                            passed under direct vision. Throughout the                            procedure, the patient's blood pressure, pulse, and                            oxygen saturations were monitored continuously. The                            Endoscope was introduced through the mouth, and                            advanced to the  second part of duodenum. The upper                            GI endoscopy was accomplished without difficulty.                            The patient tolerated the procedure well. Scope In: Scope Out: Findings:                 One benign-appearing, intrinsic moderate ringlike                            stenosis was found 38 cm from the incisors. This                            stenosis measured 1.5 cm (inner diameter). The                            scope was withdrawn. Dilation was performed with a                            Maloney dilator with no resistance at 54 Fr.                           The exam of the esophagus was otherwise normal.                           The stomach was normal save small hiatal hernia.                           The examined duodenum was normal.                           The cardia and gastric fundus were normal on                            retroflexion. Complications:            No immediate complications. Estimated Blood Loss:     Estimated blood loss: none. Impression:               -  Benign-appearing esophageal stenosis. Dilated.                           - Normal stomach. Small hiatal hernia.                           - Normal examined duodenum.                           - No specimens collected. Recommendation:           - Patient has a contact number available for                            emergencies. The signs and symptoms of potential                            delayed complications were discussed with the                            patient. Return to normal activities tomorrow.                            Written discharge instructions were provided to the                            patient.                           -Post dilation diet.                           - Continue present medications. Wilhemina Bonito. Marina Goodell, MD 05/11/2021 11:52:26 AM This report has been signed electronically.

## 2021-05-11 NOTE — Progress Notes (Signed)
HISTORY OF PRESENT ILLNESS:  [name] is a 75 y.o. female who was evaluated in the office September 06, 2020 regarding chronic GERD with breakthrough symptoms, vague dysphagia, chronic diarrhea, and follow-up screening colonoscopy.  See that dictation for details.  No significant interval change  REVIEW OF SYSTEMS:  All non-GI ROS negative. Past Medical History:  Diagnosis Date   Acute pharyngitis    Acute sinusitis, unspecified    Allergic rhinitis, cause unspecified    Allergy    seasonal   Chronic airway obstruction, not elsewhere classified    Dysfunction of eustachian tube    Esophageal reflux    Hemorrhoids    Hiatal hernia    Hypertension    IBS (irritable bowel syndrome)    Osteopenia    Osteoporosis    Personal history of colonic polyps    Schatzki's ring    Unspecified asthma(493.90)    Unspecified gastritis and gastroduodenitis without Ball of hemorrhage     Past Surgical History:  Procedure Laterality Date   ABDOMINAL HYSTERECTOMY     COLONOSCOPY     CYST REMOVAL HAND     FOOT SURGERY     LAPAROSCOPIC HYSTERECTOMY     PARTIAL MASTECTOMY WITH NEEDLE LOCALIZATION Left 08/27/2012   lumpectomy, non cancerous  Holly Mention, MD;  Location: [facility] OR;  Service: General;  Laterality: Left;   TONSILLECTOMY     UPPER GASTROINTESTINAL ENDOSCOPY      Social History [name]  reports that she has never smoked. She has never used smokeless tobacco. She reports that she does not drink alcohol and does not use drugs.  family history includes Allergies in her brother, brother, and sister; Alzheimer's disease in her father; Asthma in her maternal grandfather; Diabetes in her sister.  Allergies  Allergen Reactions   Codeine Nausea Only   Levaquin [Levofloxacin] Rash and Other (See Comments)    Rash and dizziness IN HIGH DOSES-Can used 250mg  dose.          PHYSICAL EXAMINATION:  Vital signs: BP 125/81    Pulse 93    Temp 97.8 F (36.6 C) (Temporal)     Ht 5' 0.3" (1.532 m)    Wt 105 lb (47.6 kg)    SpO2 97%    BMI 20.30 kg/m  General: Well-developed, well-nourished, no acute distress HEENT: Sclerae are anicteric, conjunctiva pink. Oral mucosa intact Lungs: Clear Heart: Regular Abdomen: soft, nontender, nondistended, no obvious ascites, no peritoneal signs, normal bowel sounds. No organomegaly. Extremities: No edema Psychiatric: alert and oriented x3. Cooperative     ASSESSMENT:   1.  Chronic GERD.  Breakthrough symptoms on pantoprazole 40 mg daily 2.  Vague esophageal dysphagia.  Possibly secondary to known esophageal ring 3.  Colon cancer screening.  Baseline risk.  Last examination 2011 4.  Diarrhea predominant irritable bowel syndrome.  Ongoing     PLAN:   1.  Reflux precautions 2.  Continue pantoprazole 40 mg daily.  Medication risks reviewed 3.  Upper endoscopy with biopsies and possible esophageal dilation.The nature of the procedure, as well as the risks, benefits, and alternatives were carefully and thoroughly reviewed with the patient. Ample time for discussion and questions allowed. The patient understood, was satisfied, and agreed to proceed. 4.  Colonoscopy with biopsies and polypectomy if necessary.The nature of the procedure, as well as the risks, benefits, and alternatives were carefully and thoroughly reviewed with the patient. Ample time for discussion and questions allowed. The patient understood, was satisfied, and agreed to  proceed.

## 2021-05-11 NOTE — Op Note (Signed)
[place] Patient Name: [name] Procedure Date: 05/11/2021 11:12 AM MRN: [id number] Endoscopist: Docia Chuck. Henrene Pastor , MD Age: 75 Referring MD:  Date of Birth: [birth year] Gender: Female Account #: [id number] Procedure:                Colonoscopy with biopsies Indications:              Screening in patient at average risk:. Previous                            examination 2011 was negative for neoplasia,                            Incidental diarrhea noted Medicines:                Monitored Anesthesia Care Procedure:                Pre-Anesthesia Assessment:                           - Prior to the procedure, a History and Physical                            was performed, and patient medications and                            allergies were reviewed. The patient's tolerance of                            previous anesthesia was also reviewed. The risks                            and benefits of the procedure and the sedation                            options and risks were discussed with the patient.                            All questions were answered, and informed consent                            was obtained. Prior Anticoagulants: The patient has                            taken no previous anticoagulant or antiplatelet                            agents. ASA Grade Assessment: II - A patient with                            mild systemic disease. After reviewing the risks                            and benefits, the patient was deemed in  satisfactory condition to undergo the procedure.                           After obtaining informed consent, the colonoscope                            was passed under direct vision. Throughout the                            procedure, the patient's blood pressure, pulse, and                            oxygen saturations were monitored continuously. The                            CF [id number] #[id number] was  introduced through the anus                            and advanced to the the cecum, identified by                            appendiceal orifice and ileocecal valve. The                            ileocecal valve, appendiceal orifice, and rectum                            were photographed. The quality of the bowel                            preparation was excellent. The colonoscopy was                            performed without difficulty. The patient tolerated                            the procedure well. The bowel preparation used was                            SUPREP via split dose instruction. Scope In: 11:24:51 AM Scope Out: 11:36:50 AM Scope Withdrawal Time: 0 hours 6 minutes 37 seconds  Total Procedure Duration: 0 hours 11 minutes 59 seconds  Findings:                 The entire examined colon appeared normal on direct                            and retroflexion views. Biopsies for histology were                            taken with a cold forceps from the entire colon for                            evaluation of microscopic colitis. Complications:  No immediate complications. Estimated blood loss:                            None. Estimated Blood Loss:     Estimated blood loss: none. Impression:               - The entire examined colon is normal on direct and                            retroflexion views. Recommendation:           - Repeat colonoscopy is not recommended for                            surveillance.                           - Patient has a contact number available for                            emergencies. The signs and symptoms of potential                            delayed complications were discussed with the                            patient. Return to normal activities tomorrow.                            Written discharge instructions were provided to the                            patient.                           - Resume previous  diet.                           - Continue present medications.                           - Await pathology results. Wilhemina Bonito. Marina Goodell, MD 05/11/2021 11:41:27 AM This report has been signed electronically.

## 2021-05-11 NOTE — Progress Notes (Signed)
Pt's states no medical or surgical changes since previsit or office visit. 

## 2021-05-11 NOTE — Patient Instructions (Signed)
YOU HAD AN ENDOSCOPIC PROCEDURE TODAY AT THE [facility]:   Refer to the procedure report that was given to you for any specific questions about what was found during the examination.  If the procedure report does not answer your questions, please call your gastroenterologist to clarify.  If you requested that your care partner not be given the details of your procedure findings, then the procedure report has been included in a sealed envelope for you to review at your convenience later.  YOU SHOULD EXPECT: Some feelings of bloating in the abdomen. Passage of more gas than usual.  Walking can help get rid of the air that was put into your GI tract during the procedure and reduce the bloating. If you had a lower endoscopy (such as a colonoscopy or flexible sigmoidoscopy) you may notice spotting of blood in your stool or on the toilet paper. If you underwent a bowel prep for your procedure, you may not have a normal bowel movement for a few days.  Please Note:  You might notice some irritation and congestion in your nose or some drainage.  This is from the oxygen used during your procedure.  There is no need for concern and it should clear up in a day or so.  SYMPTOMS TO REPORT IMMEDIATELY:   Following lower endoscopy (colonoscopy or flexible sigmoidoscopy):  Excessive amounts of blood in the stool  Significant tenderness or worsening of abdominal pains  Swelling of the abdomen that is new, acute  Fever of 100F or higher   Following upper endoscopy (EGD)  Vomiting of blood or coffee ground material  New chest pain or pain under the shoulder blades  Painful or persistently difficult swallowing  New shortness of breath  Fever of 100F or higher  Black, tarry-looking stools  For urgent or emergent issues, a gastroenterologist can be reached at any hour by calling [phone]. Do not use MyChart messaging for urgent concerns.    DIET:  We do recommend a small meal at first, but  then you may proceed to your regular diet.  Drink plenty of fluids but you should avoid alcoholic beverages for 24 hours.  ACTIVITY:  You should plan to take it easy for the rest of today and you should NOT DRIVE or use heavy machinery until tomorrow (because of the sedation medicines used during the test).    FOLLOW UP: Our staff will call the number listed on your records 48-72 hours following your procedure to check on you and address any questions or concerns that you may have regarding the information given to you following your procedure. If we do not reach you, we will leave a message.  We will attempt to reach you two times.  During this call, we will ask if you have developed any symptoms of COVID 19. If you develop any symptoms (ie: fever, flu-like symptoms, shortness of breath, cough etc.) before then, please call [phone].  If you test positive for Covid 19 in the 2 weeks post procedure, please call and report this information to us.    If any biopsies were taken you will be contacted by phone or by letter within the next 1-3 weeks.  Please call us at [phone] if you have not heard about the biopsies in 3 weeks.    SIGNATURES/CONFIDENTIALITY: You and/or your care partner have signed paperwork which will be entered into your electronic medical record.  These signatures attest to the fact that that the information above on   your After Visit Summary has been reviewed and is understood.  Full responsibility of the confidentiality of this discharge information lies with you and/or your care-partner. 

## 2021-05-11 NOTE — Progress Notes (Signed)
Sedate, gd SR, tolerated procedure well, VSS, report to RN 

## 2021-05-11 NOTE — Progress Notes (Signed)
Called to room to assist during endoscopic procedure.  Patient ID and intended procedure confirmed with present staff. Received instructions for my participation in the procedure from the performing physician.  

## 2021-05-13 ENCOUNTER — Telehealth: Payer: Self-pay

## 2021-05-13 ENCOUNTER — Encounter: Payer: Self-pay | Admitting: Internal Medicine

## 2021-05-13 NOTE — Telephone Encounter (Signed)
°  Follow up Call-  Call back number 05/11/2021  Post procedure Call Back phone  # [phone]  Permission to leave phone message Yes  Some recent data might be hidden     Patient questions:  Do you have a fever, pain , or abdominal swelling? No. Pain Score  0 *  Have you tolerated food without any problems? Yes.    Have you been able to return to your normal activities? Yes.    Do you have any questions about your discharge instructions: Diet   No. Medications  No. Follow up visit  No.  Do you have questions or concerns about your Care? No.  Actions: * If pain score is 4 or above: No action needed, pain <4.  Have you developed a fever since your procedure? NO  2.   Have you had an respiratory symptoms (SOB or cough) since your procedure? NO  3.   Have you tested positive for COVID 19 since your procedure NO  4.   Have you had any family members/close contacts diagnosed with the COVID 19 since your procedure?  NO   If yes to any of these questions please route to Laverna Peace, RN and Karlton Lemon, RN

## 2021-05-19 ENCOUNTER — Other Ambulatory Visit: Payer: Self-pay | Admitting: Internal Medicine

## 2021-05-19 DIAGNOSIS — J3089 Other allergic rhinitis: Secondary | ICD-10-CM

## 2021-08-20 NOTE — Progress Notes (Signed)
HPI ?female never smoker followed for Allergic rhinitis, COPD/asthma, complicated by ulcerative colitis, HTN, GERD,  ?PFT 2011 mild obstruction with air trapping ?PFT 06/21/16-moderate obstructive airways disease insignificant response to bronchodilator diffusion mildly reduced. FVC 1.97/71%, FEV1 1.41/68%, ratio 0.72, TLC 99%, DLCO 75% ?HST-04/04/17-AHI 13.5/hour, desaturation to 73%, body weight 112 pounds ?CT chest 06/21/16-5 mm left lower lobe pulmonary nodule- low risk ?-----------------------------------------------------. ? ? ? ?08/24/20- 76 year old female never smoker followed for OSA, Allergic rhinitis, Asthma, LLL nodule, fatigue, complicated by ulcerative colitis, GERD ?HST 08/12/20- AHI 4.2/ hr, desaturation to 77%, body weight 100 lbs ?CPAP auto 5-10/[employer] ?Download- ?Body weight today-103 lbs ?Covid vax-3 Phizer ?Flu vax-had ?Discussed sleep study. No longer needs CPAP- she is pleased. ?Breathing is comfortable, without early Spring pollen concerns yet. ?Pending cataract surgery. ? ?08/23/21- 76 year old female never smoker followed for Allergic rhinitis, COPD/Asthma, LLL nodule, fatigue, complicated by ulcerative colitis, GERD, HTN,  ?No inhalers, Flonase ?Body weight today-103 lbs ?Covid vax-3 Phizer ?Flu vax-had ?She denies cough or wheeze, but then proceeded to have a dry hacking cough that she blamed on postnasal drip from seasonal allergy.  She works outdoors a lot.  Does use Flonase and occasional antihistamine.  She asks to be able to call for a Z-Pak if she has a sinus infection (last was in the fall 2022).  Otherwise she feels she is doing very well. ? ?ROS-see HPI  "+" = positive ?Constitutional:   No-   weight loss, night sweats, fevers, chills, + fatigue, lassitude. ?HEENT:   No-  headaches, difficulty swallowing, tooth/dental problems, sore throat,  ?     No-  sneezing, itching, ear ache, +nasal congestion, +post nasal drip,  ?CV:  + chest pain, orthopnea, PND, swelling in lower  extremities, anasarca,  dizziness, palpitations ?Resp: No-   shortness of breath with exertion or at rest.   ?              productive cough,  No non-productive cough,  No- coughing up of blood.   ?           No-   change in color of mucus.  No- wheezing.   ?Skin: No-   rash or lesions. ?GI:  + heartburn, indigestion, abdominal pain, nausea, vomiting,  ?GU:  ?MS:  No-   joint pain or swelling.   ?Neuro-     nothing unusual ?Psych:  No- change in mood or affect. No depression or anxiety.  No memory loss. ? ?OBJ- Physical Exam ?General- Alert, Oriented, Affect-appropriate, Distress- none acute, + trim and alert ?Skin- rash-none, lesions- none, excoriation- none ?Lymphadenopathy- none ?Head- atraumatic ?           Eyes- Gross vision intact, PERRLA, conjunctivae and secretions clear ?           Ears- Hearing, canals-normal ?           Nose-  +Mild turbinate edema, +Septal dev, no-mucus, polyps,  erosions  ?           Throat- Mallampati IV , mucosa clear , drainage- none, tonsils- atrophic ?Neck- flexible , trachea midline, no stridor , thyroid nl, carotid no bruit ?Chest - symmetrical excursion , unlabored ?          Heart/CV- RRR , no murmur , no gallop  , no rub, nl s1 s2 ?                          - JVD-  none , edema- none, stasis changes- none, varices- none ?          Lung- clear, wheeze- none, cough+light , dullness-none, rub- none ?          Chest wall-  ?Abd-  ?Br/ Gen/ Rectal- Not done, not indicated ?Extrem- cyanosis- none, clubbing, none, atrophy- none, strength- nl ?Neuro- grossly intact to observation ? ? ? ? ? ?

## 2021-08-23 ENCOUNTER — Ambulatory Visit (INDEPENDENT_AMBULATORY_CARE_PROVIDER_SITE_OTHER): Payer: Medicare Other | Admitting: Internal Medicine

## 2021-08-23 ENCOUNTER — Encounter: Payer: Self-pay | Admitting: Internal Medicine

## 2021-08-23 ENCOUNTER — Ambulatory Visit (INDEPENDENT_AMBULATORY_CARE_PROVIDER_SITE_OTHER): Payer: Medicare Other

## 2021-08-23 ENCOUNTER — Other Ambulatory Visit: Payer: Self-pay

## 2021-08-23 VITALS — BP 130/74 | HR 74 | Temp 97.9°F | Ht 62.0 in | Wt 95.1 lb

## 2021-08-23 DIAGNOSIS — Z0000 Encounter for general adult medical examination without abnormal findings: Secondary | ICD-10-CM

## 2021-08-23 DIAGNOSIS — Z23 Encounter for immunization: Secondary | ICD-10-CM | POA: Diagnosis not present

## 2021-08-23 DIAGNOSIS — R911 Solitary pulmonary nodule: Secondary | ICD-10-CM | POA: Diagnosis not present

## 2021-08-23 DIAGNOSIS — J3089 Other allergic rhinitis: Secondary | ICD-10-CM | POA: Diagnosis not present

## 2021-08-23 DIAGNOSIS — J302 Other seasonal allergic rhinitis: Secondary | ICD-10-CM

## 2021-08-23 NOTE — Assessment & Plan Note (Signed)
She feels quite stable and comfortable with her breathing.  Asked about pneumonia vaccine and we discussed Prevnar 20. ?Plan-no lung inhalers needed for now.  Update CXR.  Prevnar 20. ?

## 2021-08-23 NOTE — Assessment & Plan Note (Signed)
Seasonal rhinitis with postnasal drainage ?Plan-Flonase, OTC antihistamine ?

## 2021-08-23 NOTE — Patient Instructions (Signed)
Order- Prevnar 20 pneumonia vaccine ? ?Order- CXR  dx lung nodule,  ? ?Please call if we can help ?

## 2021-08-23 NOTE — Addendum Note (Signed)
Addended by: Marcellus Scott on: 08/23/2021 11:08 AM ? ? Modules accepted: Orders ? ?

## 2021-08-23 NOTE — Assessment & Plan Note (Signed)
Considered low risk in the past but we will update CXR ?

## 2021-09-01 ENCOUNTER — Telehealth: Payer: Self-pay | Admitting: Internal Medicine

## 2021-09-01 NOTE — Telephone Encounter (Signed)
I've pulled the order and will work on getting this scheduled for the pt.  ?

## 2021-09-01 NOTE — Telephone Encounter (Signed)
I have the CT scheduled at [facility] 4/14 3:30 arrive at 3, pt is aware and will be calling to reschedule the appt ?

## 2021-09-09 ENCOUNTER — Ambulatory Visit (HOSPITAL_COMMUNITY)
Admission: RE | Admit: 2021-09-09 | Discharge: 2021-09-09 | Disposition: A | Discharge status: Home / Self Care | Payer: Medicare Other | Source: Ambulatory Visit | Attending: Internal Medicine | Admitting: Internal Medicine

## 2021-09-09 DIAGNOSIS — R911 Solitary pulmonary nodule: Secondary | ICD-10-CM

## 2022-06-14 ENCOUNTER — Ambulatory Visit (INDEPENDENT_AMBULATORY_CARE_PROVIDER_SITE_OTHER): Payer: Medicare Other | Admitting: Podiatry

## 2022-06-14 DIAGNOSIS — G609 Hereditary and idiopathic neuropathy, unspecified: Secondary | ICD-10-CM

## 2022-06-14 NOTE — Progress Notes (Signed)
   Chief Complaint  Patient presents with   Numbness    Patient has bilateral numbness in toes, started 8 months ago,     HPI: 77 y.o. female presenting today as a new patient for evaluation of numbness and tingling to the bilateral toes.  Patient states that she has developed numbness to the toes since September 2023.  Idiopathic onset.  She says that the numbness is completely isolated to the toes.  She does not experience any burning or tingling sensations.  Presenting for further treatment evaluation  Past Medical History:  Diagnosis Date   Acute pharyngitis    Acute sinusitis, unspecified    Allergic rhinitis, cause unspecified    Allergy    seasonal   Chronic airway obstruction, not elsewhere classified    Dysfunction of eustachian tube    Esophageal reflux    Hemorrhoids    Hiatal hernia    Hypertension    IBS (irritable bowel syndrome)    Osteopenia    Osteoporosis    Personal history of colonic polyps    Schatzki's ring    Unspecified asthma(493.90)    Unspecified gastritis and gastroduodenitis without mention of hemorrhage     Past Surgical History:  Procedure Laterality Date   ABDOMINAL HYSTERECTOMY     COLONOSCOPY     CYST REMOVAL HAND     FOOT SURGERY     LAPAROSCOPIC HYSTERECTOMY     PARTIAL MASTECTOMY WITH NEEDLE LOCALIZATION Left 08/27/2012   lumpectomy, non cancerous  Adin Hector, MD;  Location: [facility];  Service: General;  Laterality: Left;   TONSILLECTOMY     UPPER GASTROINTESTINAL ENDOSCOPY      Allergies  Allergen Reactions   Codeine Nausea Only   Levaquin [Levofloxacin] Rash and Other (See Comments)    Rash and dizziness IN HIGH DOSES-Can used 250mg  dose.        Physical Exam: General: The patient is alert and oriented x3 in no acute distress.  Dermatology: Skin is cool, dry and supple bilateral lower extremities. Negative for open lesions or macerations.  Capillary refill immediate.  Clinically no concern for vascular  compromise  Vascular: Palpable pedal pulses bilaterally. Capillary refill within normal limits.  Negative for any significant edema or erythema  Neurological: Light touch slightly diminished with the toes bilateral.  Paresthesia noted with light touch to the digits  Musculoskeletal Exam: No pedal deformities noted  Assessment: 1.  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  Recommend taking temporarily over short 1-89-month course to see if this helps improve symptoms -Recommend good supportive shoes and sneakers that allow plenty of room in the toebox area -Return to clinic as needed     Edrick Kins, DPM [facility]  Dr. Edrick Kins, DPM    [address], [place] [address]                Office [phone]  Fax [phone]

## 2022-07-12 ENCOUNTER — Other Ambulatory Visit (HOSPITAL_COMMUNITY): Payer: Self-pay | Admitting: Registered Nurse

## 2022-07-12 DIAGNOSIS — I10 Essential (primary) hypertension: Secondary | ICD-10-CM

## 2022-07-31 ENCOUNTER — Ambulatory Visit (HOSPITAL_COMMUNITY)
Admission: RE | Admit: 2022-07-31 | Discharge: 2022-07-31 | Disposition: A | Discharge status: Home / Self Care | Payer: Medicare Other | Source: Ambulatory Visit | Attending: Registered Nurse | Admitting: Registered Nurse

## 2022-07-31 DIAGNOSIS — I10 Essential (primary) hypertension: Secondary | ICD-10-CM | POA: Insufficient documentation

## 2022-08-09 ENCOUNTER — Other Ambulatory Visit: Payer: Self-pay

## 2022-08-09 DIAGNOSIS — L819 Disorder of pigmentation, unspecified: Secondary | ICD-10-CM

## 2022-08-09 DIAGNOSIS — R200 Anesthesia of skin: Secondary | ICD-10-CM

## 2022-08-16 ENCOUNTER — Ambulatory Visit (HOSPITAL_COMMUNITY)
Admission: RE | Admit: 2022-08-16 | Discharge: 2022-08-16 | Disposition: A | Discharge status: Home / Self Care | Payer: Medicare Other | Source: Ambulatory Visit | Attending: Vascular Surgery | Admitting: Vascular Surgery

## 2022-08-16 DIAGNOSIS — L819 Disorder of pigmentation, unspecified: Secondary | ICD-10-CM | POA: Insufficient documentation

## 2022-08-16 DIAGNOSIS — R200 Anesthesia of skin: Secondary | ICD-10-CM

## 2022-08-16 LAB — VAS US ABI WITH/WO TBI
Left ABI: 1.16
Right ABI: 1.22

## 2022-08-16 NOTE — Progress Notes (Unsigned)
HISTORY AND PHYSICAL     CC:  follow up. Requesting Provider:  Associates, Lady Gary *  HPI: This is a 77 y.o. female who is here today for follow up for evaluation of lower extremity blood flow. .  Pt was seen by Dr. Scot Dock in 2021 for evaluation of her left radial artery.  She did have some tortuosity of the artery but she had excellent flow and no evidence of dissection or aneurysm.    She was seen by Dr. Amalia Hailey in January for numbness and tingling in toes bilaterally that had been present since September 2023 when she had surgery on her surgery on her left foot.    The pt comes in today to have her blood flow evaluated to make sure this is not the cause of her burning pain in her feet.  She states that she had surgery on her left great toe in September and she has had a burning pain there since then.  She states she was compensating with her right side after that and developed pain in the right leg and foot.  She was in PT for 2.5 months and this improved.  She states she just finished a prednisone pack on Tuesday and the pain in her feet are better. She denies any claudication or non healing wounds.  She gets some cramps in her calves when she is dehydrated.  She stays active.  She states that her left radial artery has gotten smaller in size. She does not have any further issues with this.   The pt is not on a statin for cholesterol management.    The pt is not on an aspirin.    Other AC:  none The pt is on ARB for hypertension.  The pt is not on medication for diabetes. Tobacco hx:  never  Pt does not have family hx of AAA.  Past Medical History:  Diagnosis Date   Acute pharyngitis    Acute sinusitis, unspecified    Allergic rhinitis, cause unspecified    Allergy    seasonal   Chronic airway obstruction, not elsewhere classified    Dysfunction of eustachian tube    Esophageal reflux    Hemorrhoids    Hiatal hernia    Hypertension    IBS (irritable bowel syndrome)     Osteopenia    Osteoporosis    Personal history of colonic polyps    Schatzki's ring    Unspecified asthma(493.90)    Unspecified gastritis and gastroduodenitis without mention of hemorrhage     Past Surgical History:  Procedure Laterality Date   ABDOMINAL HYSTERECTOMY     COLONOSCOPY     CYST REMOVAL HAND     FOOT SURGERY     LAPAROSCOPIC HYSTERECTOMY     PARTIAL MASTECTOMY WITH NEEDLE LOCALIZATION Left 08/27/2012   lumpectomy, non cancerous  Adin Hector, MD;  Location: [facility];  Service: General;  Laterality: Left;   TONSILLECTOMY     UPPER GASTROINTESTINAL ENDOSCOPY      Allergies  Allergen Reactions   Codeine Nausea Only   Levaquin [Levofloxacin] Rash and Other (See Comments)    Rash and dizziness IN HIGH DOSES-Can used 250mg  dose.       Current Outpatient Medications  Medication Sig Dispense Refill   acyclovir cream (ZOVIRAX) 5 %      bismuth subsalicylate (PEPTO BISMOL) 262 MG/15ML suspension Take 30 mLs by mouth at bedtime as needed for indigestion or diarrhea or loose stools.  Calcium Carbonate (CALCIUM 500 PO) Take 500 mg by mouth at bedtime.     Calcium Carbonate Antacid (TUMS PO) Take 1 tablet by mouth daily as needed (indigestion/heartburn).     Cholecalciferol (VITAMIN D3) 50 MCG (2000 UT) capsule Take 2,000 Units by mouth daily.     denosumab (PROLIA) 60 MG/ML SOLN injection Inject 60 mg into the skin every 6 (six) months.      fluticasone (FLONASE) 50 MCG/ACT nasal spray PLACE 2 SPRAYS INTO BOTH NOSTRILS DAILY AS NEEDED FOR ALLERGIES OR RHINITIS. 16 mL 3   loperamide (IMODIUM) 2 MG capsule Take 2 mg by mouth daily as needed for diarrhea or loose stools.      losartan (COZAAR) 50 MG tablet Take 50 mg by mouth at bedtime. (Patient not taking: Reported on 06/14/2022)     Multiple Vitamin (MULTIVITAMIN WITH MINERALS) TABS Take 1 tablet by mouth at bedtime.     No current facility-administered medications for this visit.    Family History  Problem  Relation Age of Onset   Alzheimer's disease Father    Diabetes Sister    Allergies Sister    Allergies Brother    Allergies Brother    Asthma Maternal Grandfather    Colon cancer Neg Hx    Esophageal cancer Neg Hx     Social History   Socioeconomic History   Marital status: Widowed    Spouse name: Not on file   Number of children: Not on file   Years of education: Not on file   Highest education level: Not on file  Occupational History   Occupation: Retired    Comment: Chartered certified accountant for [employer]  Tobacco Use   Smoking status: Never   Smokeless tobacco: Never  Vaping Use   Vaping Use: Never used  Substance and Sexual Activity   Alcohol use: No    Alcohol/week: 0.0 standard drinks of alcohol   Drug use: No   Sexual activity: Not on file  Other Topics Concern   Not on file  Social History Narrative   Not on file   Social Determinants of Health   Financial Resource Strain: Not on file  Food Insecurity: Not on file  Transportation Needs: Not on file  Physical Activity: Not on file  Stress: Not on file  Social Connections: Not on file  Intimate Partner Violence: Not on file     REVIEW OF SYSTEMS:   [X]  denotes positive finding, [ ]  denotes negative finding Cardiac  Comments:  Chest pain or chest pressure:    Shortness of breath upon exertion:    Short of breath when lying flat:    Irregular heart rhythm:        Vascular    Pain in calf, thigh, or hip brought on by ambulation:    Pain in feet at night that wakes you up from your sleep:  x   Blood clot in your veins:    Leg swelling:         Pulmonary    Oxygen at home:    Productive cough:     Wheezing:         Neurologic    Sudden weakness in arms or legs:     Sudden numbness in arms or legs:     Sudden onset of difficulty speaking or slurred speech:    Temporary loss of vision in one eye:     Problems with dizziness:         Gastrointestinal  Blood in stool:     Vomited blood:          Genitourinary    Burning when urinating:     Blood in urine:        Psychiatric    Major depression:         Hematologic    Bleeding problems:    Problems with blood clotting too easily:        Skin    Rashes or ulcers:        Constitutional    Fever or chills:      PHYSICAL EXAMINATION:  Today's Vitals   08/17/22 1004  BP: (!) 145/79  Pulse: 93  Resp: 20  Temp: 97.6 F (36.4 C)  TempSrc: Temporal  SpO2: 97%  Weight: 96 lb 9.6 oz (43.8 kg)  Height: 5\' 2"  (1.575 m)  PainSc: 6   PainLoc: Leg   Body mass index is 17.67 kg/m.   General:  WDWN in NAD; vital signs documented above Gait: Not observed HENT: WNL, normocephalic Pulmonary: normal non-labored breathing , without wheezing Cardiac: regular HR, without carotid bruits Abdomen: soft, NT; aortic pulse is not palpable Skin: without rashes Vascular Exam/Pulses:  Right Left  Radial 2+ (normal) 2+ (normal)  Popliteal Unable to palpate Unable to palpate  DP 2+ (normal) 2+ (normal)  PT 2+ (normal) 2+ (normal)   Extremities: without ischemic changes, without Gangrene , without cellulitis; without open wounds Musculoskeletal: no muscle wasting or atrophy  Neurologic: A&O X 3 Psychiatric:  The pt has Normal affect.   Non-Invasive Vascular Imaging:   ABI's/TBI's on 08/16/2022: Right:  1.22/0.90 - Great toe pressure: 130 Left:  1.16/0.88 - Great toe pressure: 127    ASSESSMENT/PLAN:: 77 y.o. female here for evaluation to rule out PAD for source of neuropathy   -pt with easily palpable bilateral DP/PT pulses and normal ABI, therefore, not the reason for her neuropathy.  She will return to see Gaspar Skeeters, PA for referral to neurolgy for further testing.  -pt will f/u as needed   Leontine Locket, [facility] [phone]  Clinic MD:   Scot Dock

## 2022-08-17 ENCOUNTER — Ambulatory Visit (INDEPENDENT_AMBULATORY_CARE_PROVIDER_SITE_OTHER): Payer: Medicare Other | Admitting: Physician Assistant

## 2022-08-17 VITALS — BP 145/79 | HR 93 | Temp 97.6°F | Resp 20 | Ht 62.0 in | Wt 96.6 lb

## 2022-08-17 DIAGNOSIS — G629 Polyneuropathy, unspecified: Secondary | ICD-10-CM | POA: Diagnosis not present

## 2022-08-28 NOTE — Progress Notes (Signed)
HPI female never smoker followed for Allergic rhinitis, COPD/asthma, complicated by ulcerative colitis, HTN, GERD,  PFT 2011 mild obstruction with air trapping PFT 06/21/16-moderate obstructive airways disease insignificant response to bronchodilator diffusion mildly reduced. FVC 1.97/71%, FEV1 1.41/68%, ratio 0.72, TLC 99%, DLCO 75% HST-04/04/17-AHI 13.5/hour, desaturation to 73%, body weight 112 pounds CT chest 06/21/16-5 mm left lower lobe pulmonary nodule- low risk -----------------------------------------------------.   08/23/21- 77 year old female never smoker followed for Allergic rhinitis, COPD/Asthma, LLL nodule, fatigue, complicated by ulcerative colitis, GERD, HTN,  No inhalers, Flonase Body weight today-103 lbs Covid vax-3 Phizer Flu vax-had She denies cough or wheeze, but then proceeded to have a dry hacking cough that she blamed on postnasal drip from seasonal allergy.  She works outdoors a lot.  Does use Flonase and occasional antihistamine.  She asks to be able to call for a Z-Pak if she has a sinus infection (last was in the fall 2022).  Otherwise she feels she is doing very well.  09/01/22-  77--year-old female never smoker followed for Allergic rhinitis, COPD/Asthma, LLL nodule, fatigue, complicated by ulcerative colitis, GERD, HTN, Peripheral Neuropathy,  No inhalers, Flonase Body weight today- 97 lbs Covid vax-3 Phizer She is being evaluated for muscle pain following surgery last year.  Breathing is doing fine.  She has not needed any inhalers.  Rhinitis has done well and she does not find much need for her Flonase. See CT scan for cardiac scoring on March 5 showed stable left lower lobe nodule, calcifications in spleen compatible with old granulomatous disease, stable.  Agatson score 0. CT chest 09/11/21-  IMPRESSION: 1. Focal opacity consistent with atelectasis or scarring in the left upper lobe lingula adjacent to the left heart border accounts for the opacity noted on the  current chest radiograph. No follow-up indicated. 2. Small, 4 mm, benign nodule in the left lower lobe stable from the 2018 CT. No follow-up indicated. 3. No acute findings.   ROS-see HPI  "+" = positive Constitutional:   No-   weight loss, night sweats, fevers, chills, + fatigue, lassitude. HEENT:   No-  headaches, difficulty swallowing, tooth/dental problems, sore throat,       No-  sneezing, itching, ear ache, +nasal congestion, +post nasal drip,  CV:  + chest pain, orthopnea, PND, swelling in lower extremities, anasarca,  dizziness, palpitations Resp: No-   shortness of breath with exertion or at rest.                 productive cough,  No non-productive cough,  No- coughing up of blood.              No-   change in color of mucus.  No- wheezing.   Skin: No-   rash or lesions. GI:  + heartburn, indigestion, abdominal pain, nausea, vomiting,  GU:  MS:  No-   joint pain or swelling.   Neuro-     nothing unusual Psych:  No- change in mood or affect. No depression or anxiety.  No memory loss.  OBJ- Physical Exam General- Alert, Oriented, Affect-appropriate, Distress- none acute, + petite  Skin- rash-none, lesions- none, excoriation- none Lymphadenopathy- none Head- atraumatic            Eyes- Gross vision intact, PERRLA, conjunctivae and secretions clear            Ears- Hearing, canals-normal            Nose-  +Mild turbinate edema, +Septal dev, no-mucus, polyps,  erosions  Throat- Mallampati IV , mucosa clear , drainage- none, tonsils- atrophic Neck- flexible , trachea midline, no stridor , thyroid nl, carotid no bruit Chest - symmetrical excursion , unlabored           Heart/CV- RRR , no murmur , no gallop  , no rub, nl s1 s2                           - JVD- none , edema- none, stasis changes- none, varices- none           Lung- clear, wheeze- none, cough+light , dullness-none, rub- none           Chest wall-  Abd-  Br/ Gen/ Rectal- Not done, not indicated Extrem-  cyanosis- none, clubbing, none, atrophy- none, strength- nl Neuro- grossly intact to observation

## 2022-08-29 ENCOUNTER — Ambulatory Visit: Payer: Medicare Other | Admitting: Internal Medicine

## 2022-09-01 ENCOUNTER — Ambulatory Visit (INDEPENDENT_AMBULATORY_CARE_PROVIDER_SITE_OTHER): Payer: Medicare Other | Admitting: Internal Medicine

## 2022-09-01 ENCOUNTER — Encounter: Payer: Self-pay | Admitting: Internal Medicine

## 2022-09-01 VITALS — BP 134/86 | HR 87 | Ht 62.0 in | Wt 97.6 lb

## 2022-09-01 DIAGNOSIS — J3089 Other allergic rhinitis: Secondary | ICD-10-CM

## 2022-09-01 DIAGNOSIS — J302 Other seasonal allergic rhinitis: Secondary | ICD-10-CM | POA: Diagnosis not present

## 2022-09-01 DIAGNOSIS — J4520 Mild intermittent asthma, uncomplicated: Secondary | ICD-10-CM

## 2022-09-01 NOTE — Patient Instructions (Addendum)
Glad you are doing well. Please call if we can help 

## 2022-10-04 NOTE — Assessment & Plan Note (Signed)
Intermittent uncomplicated.  Not needing inhalers currently Plan-we discussed symptoms that might warrant reporting to [place].  She will let us know if needed.

## 2022-10-04 NOTE — Assessment & Plan Note (Signed)
No significant problems this spring.  Has Flonase and could get an antihistamine if needed.

## 2022-10-10 ENCOUNTER — Other Ambulatory Visit: Payer: Self-pay

## 2022-10-10 DIAGNOSIS — M810 Age-related osteoporosis without current pathological fracture: Secondary | ICD-10-CM | POA: Insufficient documentation

## 2022-10-10 DIAGNOSIS — M818 Other osteoporosis without current pathological fracture: Secondary | ICD-10-CM

## 2022-10-13 ENCOUNTER — Telehealth: Payer: Self-pay | Admitting: Pharmacy Technician

## 2022-10-13 NOTE — Telephone Encounter (Signed)
Auth Submission: APPROVED Site of care: Site of care: [facility] Payer: [facility] medicare Medication & CPT/J Code(s) submitted: Prolia (Denosumab) [id number] Route of submission (phone, fax, portal):  Phone # Fax # Auth type: Buy/Bill Units/visits requested: 2 Reference number: [id number] Approval from: 10/13/22 to 10/13/23

## 2022-11-06 ENCOUNTER — Ambulatory Visit: Payer: Medicare Other

## 2022-11-22 ENCOUNTER — Ambulatory Visit (INDEPENDENT_AMBULATORY_CARE_PROVIDER_SITE_OTHER): Payer: Medicare Other

## 2022-11-22 VITALS — BP 164/91 | HR 79 | Temp 98.0°F | Resp 16 | Ht 62.0 in | Wt 98.4 lb

## 2022-11-22 DIAGNOSIS — M818 Other osteoporosis without current pathological fracture: Secondary | ICD-10-CM

## 2022-11-22 MED ORDER — DENOSUMAB 60 MG/ML ~~LOC~~ SOSY
60.0000 mg | PREFILLED_SYRINGE | Freq: Once | SUBCUTANEOUS | Status: AC
  Administered 2022-11-22: 60 mg via SUBCUTANEOUS

## 2022-11-22 NOTE — Progress Notes (Signed)
Diagnosis: Osteoporosis  Provider:  Mannam, Praveen MD  Procedure: Injection  Prolia (Denosumab), Dose: 60 mg, Site: subcutaneous, Number of injections: 1   Discharge: Condition: Good, Destination: Home . AVS Provided  Performed by:  Contrina Orona, RN       

## 2023-02-19 ENCOUNTER — Telehealth: Payer: Self-pay | Admitting: Internal Medicine

## 2023-02-19 DIAGNOSIS — J302 Other seasonal allergic rhinitis: Secondary | ICD-10-CM

## 2023-02-19 NOTE — Telephone Encounter (Signed)
Patient states needs refill for Flonase nasal spray. Pharmacy is [facility]. Patient phone number is [phone].

## 2023-02-22 MED ORDER — FLUTICASONE PROPIONATE 50 MCG/ACT NA SUSP: 2.0000 | Freq: Every day | NASAL | 1 refills | Status: DC | PRN

## 2023-02-22 NOTE — Telephone Encounter (Signed)
Patient checking on message for Flonase nasal spray. Patient phone number is [phone].

## 2023-02-22 NOTE — Telephone Encounter (Signed)
Ok to refill flonase up to 1 year

## 2023-02-22 NOTE — Telephone Encounter (Signed)
Is it ok to refill flonase?

## 2023-02-23 MED ORDER — FLUTICASONE PROPIONATE 50 MCG/ACT NA SUSP: 2.0000 | Freq: Every day | NASAL | 11 refills | Status: AC | PRN

## 2023-02-23 NOTE — Addendum Note (Signed)
Addended by: Janean Sark on: 02/23/2023 11:49 AM   Modules accepted: Orders

## 2023-06-06 ENCOUNTER — Ambulatory Visit: Payer: Medicare Other

## 2023-06-06 VITALS — BP 146/93 | HR 91 | Temp 98.7°F | Resp 16 | Ht 62.0 in | Wt 94.4 lb

## 2023-06-06 DIAGNOSIS — M818 Other osteoporosis without current pathological fracture: Secondary | ICD-10-CM | POA: Diagnosis not present

## 2023-06-06 MED ORDER — DENOSUMAB 60 MG/ML ~~LOC~~ SOSY
60.0000 mg | PREFILLED_SYRINGE | Freq: Once | SUBCUTANEOUS | Status: AC
  Administered 2023-06-06: 60 mg via SUBCUTANEOUS
  Filled 2023-06-06: qty 1

## 2023-06-06 NOTE — Progress Notes (Signed)
 Diagnosis: Osteoporosis  Provider:  Mannam, Praveen MD  Procedure: Injection  Prolia  (Denosumab ), Dose: 60 mg, Site: subcutaneous, Number of injections: 1  Injection Site(s): Right arm  Post Care:  N/A  Discharge: Condition: Good, Destination: Home . AVS Provided  Performed by:  Donny Childes, RN

## 2023-07-23 NOTE — Therapy (Signed)
 OUTPATIENT PHYSICAL THERAPY NEURO EVALUATION   Patient Name: [name] MRN: [id number] DOB:18-Aug-1945, 78 y.o., female Today's Date: 07/25/2023   PCP: [facility]  REFERRING PROVIDER: Dr. Theora Master  END OF SESSION:  PT End of Session - 07/25/23 1436     Visit Number 1    Number of Visits 24    Date for PT Re-Evaluation 09/04/23    Progress Note Due on Visit 10    PT Start Time 1100    PT Stop Time 1144    PT Time Calculation (min) 44 min    Equipment Utilized During Treatment Gait belt    Activity Tolerance Patient tolerated treatment well;No increased pain    Behavior During Therapy WFL for tasks assessed/performed             Past Medical History:  Diagnosis Date   Acute pharyngitis    Acute sinusitis, unspecified    Allergic rhinitis, cause unspecified    Allergy    seasonal   Chronic airway obstruction, not elsewhere classified    Dysfunction of eustachian tube    Esophageal reflux    Hemorrhoids    Hiatal hernia    Hypertension    IBS (irritable bowel syndrome)    Osteopenia    Osteoporosis    Personal history of colonic polyps    Schatzki's ring    Unspecified asthma(493.90)    Unspecified gastritis and gastroduodenitis without mention of hemorrhage    Past Surgical History:  Procedure Laterality Date   ABDOMINAL HYSTERECTOMY     COLONOSCOPY     CYST REMOVAL HAND     FOOT SURGERY     LAPAROSCOPIC HYSTERECTOMY     PARTIAL MASTECTOMY WITH NEEDLE LOCALIZATION Left 08/27/2012   lumpectomy, non cancerous  Ernestene Mention, MD;  Location: [facility];  Service: General;  Laterality: Left;   TONSILLECTOMY     UPPER GASTROINTESTINAL ENDOSCOPY     Patient Active Problem List   Diagnosis Date Noted   OP (osteoporosis) 10/10/2022   Lung nodule 08/27/2017   History of food allergy 07/10/2016   Elevated blood-pressure reading without diagnosis of hypertension 07/10/2016   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   ANEMIA, IRON DEFICIENCY 04/20/2010   IRRITABLE BOWEL SYNDROME 03/20/2008   DIARRHEA 03/20/2008   ABDOMINAL PAIN, EPIGASTRIC 03/20/2008   GASTRITIS 03/16/2008   HIATAL HERNIA 03/16/2008   History of colonic polyps 03/16/2008   SINUSITIS, ACUTE 12/26/2007   PHARYNGITIS 12/19/2007   DYSFUNCTION, EUSTACHIAN TUBE 03/12/2007   Seasonal and perennial allergic rhinitis 03/12/2007   Asthma, mild intermittent 03/12/2007   COPD mixed type (HCC) 03/12/2007   GERD 03/12/2007    ONSET DATE: 01/2022- Foot surgery  REFERRING DIAG: G62.9 (ICD-10-CM) - Neuropathy   THERAPY DIAG:  Difficulty in walking, not elsewhere classified  Muscle weakness (generalized)  Rationale for Evaluation and Treatment: Rehabilitation  SUBJECTIVE:  SUBJECTIVE STATEMENT: Patient reports she is here for improved balance due to some neuropathy and osteoarthritis. States she is not having too many issues currently but "wants to get ahead of this arthritis and neuropathy."   Pt accompanied by: self  PERTINENT HISTORY: Patient was seen by Neurology on 05/02/2023 for complaint of ongoing bilateral feet pain, stiffness, and tightness. Symptoms worse in left foot. Surgery Left foot in 2023 (patient reports great toe surgery) and previous plantar fascitis pain.   PAIN:  Are you having pain? Yes: NPRS scale: current= 0/10 at rest; best= 0/10; at worst pain 5/10 - does not take any meds except some tylenol Pain location: Dorsal and some plantar pain across toe box Pain description: Stiffness/tightness Aggravating factors: prolonged Walking Relieving factors: Rest, wearing 2 sizes larger shoe   PRECAUTIONS: Fall  RED FLAGS: None   WEIGHT BEARING RESTRICTIONS: No  FALLS: Has patient fallen in last 6  months? No  LIVING ENVIRONMENT: Lives with: lives alone and has inside dog- doesn't have to take out on a leash. Lives in: House/apartment Stairs: Yes: External: 14-15 (1 flight)  steps; can reach both Has following equipment at home: Single point cane, Walker - 2 wheeled, and shower chair  PLOF: Independent  PATIENT GOALS: Stay mobile, Improve my balance  OBJECTIVE:  Note: Objective measures were completed at Evaluation unless otherwise noted.  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  Shallow disc bulge at L5-S1 stenosis.   COGNITION: Overall cognitive status: Within functional limits for tasks assessed   SENSATION: WFL= with light touch in each Lower extremity- patient reports mild hypersensitivity in ant great toe  COORDINATION: Heel on shin= normal each LE  EDEMA:  None reported or observed in all extremities   POSTURE:  Forward head; rounded shoulders  LOWER EXTREMITY ROM:     Active  Right Eval Left Eval  Hip flexion    Hip extension    Hip abduction    Hip adduction    Hip internal rotation    Hip external rotation    Knee flexion    Knee extension    Ankle dorsiflexion  Limited to 5 deg from neutral  Ankle plantarflexion    Ankle inversion    Ankle eversion     (Blank rows = not tested)   LOWER EXTREMITY MMT:    MMT Right Eval Left Eval  Hip flexion 5 4  Hip extension    Hip abduction 4+ 4  Hip adduction    Hip internal rotation 4 4  Hip external rotation 4 4  Knee flexion 4+ 4  Knee extension 4+ 4  Ankle dorsiflexion 4 4 (in available range)   Ankle plantarflexion    Ankle inversion    Ankle eversion    (Blank rows = not tested)    TRANSFERS: Assistive device utilized: None  Sit to stand: Complete Independence Stand to sit: Complete Independence Chair to chair: Complete Independence Floor:  NT  GAIT: Gait pattern:  decreased step length- Right and decreased step length- Left Distance walked: approx 100 feet Assistive device utilized: None Level of assistance: SBA Comments: decreased heel to toe, wide Base of support, some unsteadiness - more with turning yet no specific LOB in clinic  FUNCTIONAL TESTS:  5 times sit to stand: 15.51 without UE support  Timed up and go (TUG): 11.15 and 11.35 sec without AD= 11.25 sec avg without AD 6 minute walk  test: To be assessed visit #2 10 meter walk test: 9.60 and 9.28 sec = 9.44 sec or 1.06 m/s without AD Berg Balance Scale: To be assessed visit #2  PATIENT SURVEYS:  ABC scale 67.5%                                                                                                                              TREATMENT DATE: 07/24/2023    PATIENT EDUCATION: Education details: PT plan of care; Review of symptoms of neuropathy and OA; Discussion of role of PT in assisting with balance. Discussion of anatomy of foot.  Person educated: Patient Education method: Explanation Education comprehension: verbalized understanding  HOME EXERCISE PROGRAM: To be initiated next 1-2 visits  GOALS: Goals reviewed with patient? Yes  SHORT TERM GOALS: Target date: 09/04/2023  Pt will be independent with HEP in order to improve strength and balance in order to decrease fall risk and improve function at home and work. Baseline: EVAL- Patient presents with no formal HEP in place Goal status: INITIAL   LONG TERM GOALS: Target date: 10/16/2023  1.  Patient (> 84 years old) will complete five times sit to stand test in < 15 seconds indicating an increased LE strength and improved balance. Baseline: EVAL= 15.51 sec without UE support Goal status: INITIAL  2.   Pt will improve ABC by at least 13% in order to demonstrate clinically significant improvement in balance confidence. Baseline: EVAL= 67.5% Goal status: INITIAL   3.  Patient will increase Berg Balance score by > 4  points to demonstrate decreased fall risk during functional activities. Baseline: EVAL: To be assessed visit #2 Goal status: INITIAL    4.   Patient will increase six minute walk test distance by 150 feet for progression to community ambulator and improve gait ability Baseline: EVAL= To be assessed visit #2 Goal status: INITIAL    ASSESSMENT:  CLINICAL IMPRESSION: Patient is a 78  y.o. female who was seen today for physical therapy evaluation and treatment for diagnosis of Neuropathy. Today she presents with some LE muscle weakness bilaterally as seen with manual muscle testing and > 15 sec with 5 time sit to stand indicating increased fall risk. She presents with some impaired mobility - wide base of support with standing/walking and will need further evaluation to assess more balance and endurance next visit. Patient will benefit from skilled PT services to address her functional weakness and improve her mobility to promote independent lifestyle and minimize risk of falling.   OBJECTIVE IMPAIRMENTS: Abnormal gait, decreased balance, decreased mobility, difficulty walking, decreased strength, and pain.   ACTIVITY LIMITATIONS: lifting, standing, squatting, and stairs  PARTICIPATION LIMITATIONS: cleaning, shopping, community activity, and yard work  PERSONAL FACTORS: Age and 1 comorbidity: OA  are also affecting patient's functional outcome.   REHAB POTENTIAL: Good  CLINICAL DECISION MAKING: Stable/uncomplicated  EVALUATION COMPLEXITY: Low  PLAN:  PT FREQUENCY: 1-2x/week  PT DURATION: 12 weeks  PLANNED  INTERVENTIONS: 97164- PT Re-evaluation, 97110-Therapeutic exercises, 97530- Therapeutic activity, [id number]- Neuromuscular re-education, 97535- Self Care, 25366- Manual therapy, [phone]- Gait training, [phone]- Orthotic Fit/training, [phone]- Electrical stimulation (manual), Patient/Family education, Balance training, Stair training, Taping, Dry Needling, Joint mobilization, Joint  manipulation, DME instructions, Cryotherapy, and Moist heat  PLAN FOR NEXT SESSION: BERG or DGI and add goal for most appropriate test, 6 min walk test, Instruct in LE strengthening and any appropriate mobility/balance interventions. Add exercises to HEP as appropriate.    Lenda Kelp, PT 07/25/2023, 9:20 PM

## 2023-07-24 ENCOUNTER — Ambulatory Visit: Payer: Medicare Other | Attending: Neurology

## 2023-07-24 DIAGNOSIS — R262 Difficulty in walking, not elsewhere classified: Secondary | ICD-10-CM | POA: Insufficient documentation

## 2023-07-24 DIAGNOSIS — M6281 Muscle weakness (generalized): Secondary | ICD-10-CM | POA: Diagnosis present

## 2023-07-30 ENCOUNTER — Ambulatory Visit: Payer: Medicare Other

## 2023-07-31 ENCOUNTER — Ambulatory Visit: Payer: Medicare Other | Attending: Neurology

## 2023-07-31 DIAGNOSIS — M6281 Muscle weakness (generalized): Secondary | ICD-10-CM | POA: Diagnosis present

## 2023-07-31 DIAGNOSIS — R262 Difficulty in walking, not elsewhere classified: Secondary | ICD-10-CM | POA: Diagnosis present

## 2023-07-31 NOTE — Therapy (Signed)
 OUTPATIENT PHYSICAL THERAPY NEURO TREATMENT   Patient Name: [name] MRN: [id number] DOB:1945/08/05, 78 y.o., female Today's Date: 07/31/2023   PCP: [facility]  REFERRING PROVIDER: Dr. Theora Master  END OF SESSION:  PT End of Session - 07/31/23 1011     Visit Number 2    Number of Visits 24    Date for PT Re-Evaluation 09/04/23    Progress Note Due on Visit 10    PT Start Time 1015    PT Stop Time 1058    PT Time Calculation (min) 43 min    Equipment Utilized During Treatment Gait belt    Activity Tolerance Patient tolerated treatment well;No increased pain    Behavior During Therapy Anxious             Past Medical History:  Diagnosis Date   Acute pharyngitis    Acute sinusitis, unspecified    Allergic rhinitis, cause unspecified    Allergy    seasonal   Chronic airway obstruction, not elsewhere classified    Dysfunction of eustachian tube    Esophageal reflux    Hemorrhoids    Hiatal hernia    Hypertension    IBS (irritable bowel syndrome)    Osteopenia    Osteoporosis    Personal history of colonic polyps    Schatzki's ring    Unspecified asthma(493.90)    Unspecified gastritis and gastroduodenitis without mention of hemorrhage    Past Surgical History:  Procedure Laterality Date   ABDOMINAL HYSTERECTOMY     COLONOSCOPY     CYST REMOVAL HAND     FOOT SURGERY     LAPAROSCOPIC HYSTERECTOMY     PARTIAL MASTECTOMY WITH NEEDLE LOCALIZATION Left 08/27/2012   lumpectomy, non cancerous  Ernestene Mention, MD;  Location: [facility] OR;  Service: General;  Laterality: Left;   TONSILLECTOMY     UPPER GASTROINTESTINAL ENDOSCOPY     Patient Active Problem List   Diagnosis Date Noted   OP (osteoporosis) 10/10/2022   Lung nodule 08/27/2017   History of food allergy 07/10/2016   Elevated blood-pressure reading without diagnosis of hypertension 07/10/2016   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/2010   ANEMIA, IRON DEFICIENCY 04/20/2010   IRRITABLE BOWEL SYNDROME 03/20/2008   DIARRHEA 03/20/2008   ABDOMINAL PAIN, EPIGASTRIC 03/20/2008   GASTRITIS 03/16/2008   HIATAL HERNIA 03/16/2008   History of colonic polyps 03/16/2008   SINUSITIS, ACUTE 12/26/2007   PHARYNGITIS 12/19/2007   DYSFUNCTION, EUSTACHIAN TUBE 03/12/2007   Seasonal and perennial allergic rhinitis 03/12/2007   Asthma, mild intermittent 03/12/2007   COPD mixed type (HCC) 03/12/2007   GERD 03/12/2007    ONSET DATE: 01/2022- Foot surgery  REFERRING DIAG: G62.9 (ICD-10-CM) - Neuropathy   THERAPY DIAG:  Difficulty in walking, not elsewhere classified  Muscle weakness (generalized)  Rationale for Evaluation and Treatment: Rehabilitation  SUBJECTIVE:  SUBJECTIVE STATEMENT: Patient arrives to clinic alone, no AD. Reports having a new referral for plantar fasciitis, but wants to address her neuropathy as well. Patient describes having a surgery in Sept 2023 on her L big toe, leading to lateral overload and thus pain. Does attend a gym class every Thursday. Denies falls.   Pt accompanied by: self  PERTINENT HISTORY: Patient was seen by Neurology on 05/02/2023 for complaint of ongoing bilateral feet pain, stiffness, and tightness. Symptoms worse in left foot. Surgery Left foot in 2023 (patient reports great toe surgery) and previous plantar fascitis pain.   PAIN:  Are you having pain? Yes: NPRS scale: 8/10 Pain location: Dorsal and some plantar pain across toe box Pain description: Stiffness/tightness  PRECAUTIONS: Fall  PATIENT GOALS: Stay mobile, Improve my balance  OBJECTIVE:  Note: Objective measures were completed at Evaluation unless otherwise noted.  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  Shallow disc bulge at L5-S1 stenosis.   PATIENT SURVEYS:  ABC scale 67.5%                                                                                                                              TREATMENT Physical Performance:  [facility] PT Assessment - 07/31/23 0001       Functional Gait  Assessment   Gait assessed  Yes    Gait Level Surface Walks 20 ft in less than 7 sec but greater than 5.5 sec, uses assistive device, slower speed, mild gait deviations, or deviates 6-10 in outside of the 12 in walkway width.    Change in Gait Speed Able to change speed, demonstrates mild gait deviations, deviates 6-10 in outside of the 12 in walkway width, or no gait deviations, unable to achieve a major change in velocity, or uses a change in velocity, or uses an assistive device.    Gait with Horizontal Head Turns Performs head turns smoothly with slight change in gait velocity (eg, minor disruption to smooth gait path), deviates 6-10 in outside 12 in walkway width, or uses an assistive device.    Gait with Vertical Head Turns Performs task with slight change in gait velocity (eg, minor disruption to smooth gait path), deviates 6 - 10 in outside 12 in walkway width or uses assistive device    Gait and Pivot Turn Pivot turns safely in greater than 3 sec and stops with no loss of balance, or pivot turns safely within 3 sec and stops with mild imbalance, requires small steps to catch balance.    Step Over Obstacle Is able to step over 2 stacked shoe boxes taped together (9 in total height) without changing gait speed. No evidence of imbalance.    Gait with Narrow Base of Support Ambulates 7-9 steps.    Gait with Eyes Closed Walks 20 ft, uses assistive device, slower speed, mild gait deviations, deviates 6-10  in outside 12 in walkway width. Ambulates 20 ft in less than 9 sec but greater than 7 sec.     Ambulating Backwards Walks 20 ft, no assistive devices, good speed, no evidence for imbalance, normal gait    Steps Alternating feet, no rail.    Total Score 23            Self care/ home management: -discussed PT POC, exam findings, addition of soft L hallux pad for comfort and increased medial support of L foot, appropriate shoe wear   PATIENT EDUCATION: Education details: see above Person educated: Patient Education method: Explanation Education comprehension: verbalized understanding  HOME EXERCISE PROGRAM: To be initiated next 1-2 visits  GOALS: Goals reviewed with patient? Yes  SHORT TERM GOALS: Target date: 09/04/2023  Pt will be independent with HEP in order to improve strength and balance in order to decrease fall risk and improve function at home and work. Baseline: EVAL- Patient presents with no formal HEP in place Goal status: INITIAL   LONG TERM GOALS: Target date: 10/16/2023  1.  Patient (> 31 years old) will complete five times sit to stand test in < 15 seconds indicating an increased LE strength and improved balance. Baseline: EVAL= 15.51 sec without UE support Goal status: INITIAL  2.   Pt will improve ABC by at least 13% in order to demonstrate clinically significant improvement in balance confidence. Baseline: EVAL= 67.5% Goal status: INITIAL   3. Pt will improve FGA to >/= 27/30 to demonstrate improved balance and reduced fall risk  Baseline: 23/30 Goal status: REVISED    4.   Patient will increase six minute walk test distance by 150 feet for progression to community ambulator and improve gait ability Baseline: EVAL= To be assessed visit #2 Goal status: INITIAL    ASSESSMENT:  CLINICAL IMPRESSION: Patient seen for skilled PT session with emphasis on patient education and outcome measure assessment. Patient perseverative on her neuropathy and minimizing progression of it. She does not report numbness or loss of proprioception in B LE, but does  indorse L dorsalgia. PT added small soft insert under L hallux head for greater comfort and support to medial foot. Patient scored a 23/30 on  Functional Gait Assessment.   <22/30 = predictive of falls, <20/30 = fall in 6 months, <18/30 = predictive of falls in PD MCID: 5 points stroke population, 4 points geriatric population (ANPTA Core Set of Outcome Measures for Adults with Neurologic Conditions, 2018). Continue POC, though entirety of POC is not anticipated to be needed.    OBJECTIVE IMPAIRMENTS: Abnormal gait, decreased balance, decreased mobility, difficulty walking, decreased strength, and pain.   ACTIVITY LIMITATIONS: lifting, standing, squatting, and stairs  PARTICIPATION LIMITATIONS: cleaning, shopping, community activity, and yard work  PERSONAL FACTORS: Age and 1 comorbidity: OA  are also affecting patient's functional outcome.   REHAB POTENTIAL: Good  CLINICAL DECISION MAKING: Stable/uncomplicated  EVALUATION COMPLEXITY: Low  PLAN:  PT FREQUENCY: 1-2x/week  PT DURATION: 12 weeks  PLANNED INTERVENTIONS: 97164- PT Re-evaluation, 97110-Therapeutic exercises, 97530- Therapeutic activity, [id number]- Neuromuscular re-education, 97535- Self Care, 16109- Manual therapy, [id number]- Gait training, [phone]- Orthotic Fit/training, [phone]- Electrical stimulation (manual), Patient/Family education, Balance training, Stair training, Taping, Dry Needling, Joint mobilization, Joint manipulation, DME instructions, Cryotherapy, and Moist heat  PLAN FOR NEXT SESSION: 6 min walk test, Instruct in LE strengthening and any appropriate mobility/balance interventions. Add exercises to HEP as appropriate. How did the small L hallux insert feel?   Westley Foots, PT Victorino Dike  Reuel Boom, PT, DPT, CBIS  07/31/2023, 11:17 AM

## 2023-08-01 ENCOUNTER — Ambulatory Visit: Payer: Medicare Other

## 2023-08-06 ENCOUNTER — Ambulatory Visit: Admitting: Physical Therapy

## 2023-08-06 ENCOUNTER — Ambulatory Visit: Payer: Medicare Other

## 2023-08-06 ENCOUNTER — Encounter: Payer: Self-pay | Admitting: Physical Therapy

## 2023-08-06 DIAGNOSIS — R262 Difficulty in walking, not elsewhere classified: Secondary | ICD-10-CM | POA: Diagnosis not present

## 2023-08-06 DIAGNOSIS — M6281 Muscle weakness (generalized): Secondary | ICD-10-CM

## 2023-08-06 NOTE — Therapy (Signed)
 OUTPATIENT PHYSICAL THERAPY NEURO TREATMENT   Patient Name: [name] MRN: [id number] DOB:12/11/1945, 78 y.o., female Today's Date: 08/06/2023   PCP: [facility]  REFERRING PROVIDER: Dr. Theora Master  END OF SESSION:  PT End of Session - 08/06/23 1009     Visit Number 3    Number of Visits 24    Date for PT Re-Evaluation 09/04/23    Progress Note Due on Visit 10    PT Start Time 1007    PT Stop Time 1048    PT Time Calculation (min) 41 min    Equipment Utilized During Treatment --    Activity Tolerance Patient tolerated treatment well    Behavior During Therapy WFL for tasks assessed/performed             Past Medical History:  Diagnosis Date   Acute pharyngitis    Acute sinusitis, unspecified    Allergic rhinitis, cause unspecified    Allergy    seasonal   Chronic airway obstruction, not elsewhere classified    Dysfunction of eustachian tube    Esophageal reflux    Hemorrhoids    Hiatal hernia    Hypertension    IBS (irritable bowel syndrome)    Osteopenia    Osteoporosis    Personal history of colonic polyps    Schatzki's ring    Unspecified asthma(493.90)    Unspecified gastritis and gastroduodenitis without mention of hemorrhage    Past Surgical History:  Procedure Laterality Date   ABDOMINAL HYSTERECTOMY     COLONOSCOPY     CYST REMOVAL HAND     FOOT SURGERY     LAPAROSCOPIC HYSTERECTOMY     PARTIAL MASTECTOMY WITH NEEDLE LOCALIZATION Left 08/27/2012   lumpectomy, non cancerous  Ernestene Mention, MD;  Location: [facility] OR;  Service: General;  Laterality: Left;   TONSILLECTOMY     UPPER GASTROINTESTINAL ENDOSCOPY     Patient Active Problem List   Diagnosis Date Noted   OP (osteoporosis) 10/10/2022   Lung nodule 08/27/2017   History of food allergy 07/10/2016   Elevated blood-pressure reading without diagnosis of hypertension 07/10/2016   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/2010   ANEMIA, IRON DEFICIENCY 04/20/2010   IRRITABLE BOWEL SYNDROME 03/20/2008   DIARRHEA 03/20/2008   ABDOMINAL PAIN, EPIGASTRIC 03/20/2008   GASTRITIS 03/16/2008   HIATAL HERNIA 03/16/2008   History of colonic polyps 03/16/2008   SINUSITIS, ACUTE 12/26/2007   PHARYNGITIS 12/19/2007   DYSFUNCTION, EUSTACHIAN TUBE 03/12/2007   Seasonal and perennial allergic rhinitis 03/12/2007   Asthma, mild intermittent 03/12/2007   COPD mixed type (HCC) 03/12/2007   GERD 03/12/2007    ONSET DATE: 01/2022- Foot surgery  REFERRING DIAG: G62.9 (ICD-10-CM) - Neuropathy   THERAPY DIAG:  Difficulty in walking, not elsewhere classified  Muscle weakness (generalized)  Rationale for Evaluation and Treatment: Rehabilitation  SUBJECTIVE:  SUBJECTIVE STATEMENT: Notes orthotic was adjusted in shoe and it does feel better. No falls. Pt reports BP is normally higher in the morning and then gets lower in the afternoon. Sees podiatrist this afternoon for her plantar fasciitis    Pt accompanied by: self  PERTINENT HISTORY: Patient was seen by Neurology on 05/02/2023 for complaint of ongoing bilateral feet pain, stiffness, and tightness. Symptoms worse in left foot. Surgery Left foot in 2023 (patient reports great toe surgery) and previous plantar fascitis pain.   PAIN:  Are you having pain? Yes: NPRS scale: 8/10 Pain location: Dorsal and some plantar pain across toe box Pain description: Stiffness/tightness  PRECAUTIONS: Fall  PATIENT GOALS: Stay mobile, Improve my balance  OBJECTIVE:  Note: Objective measures were completed at Evaluation unless otherwise noted.  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   Shallow disc bulge at L5-S1 stenosis.   PATIENT SURVEYS:  ABC scale 67.5%                                                                                                                              TREATMENT  Therapeutic Activity:  [facility] PT Assessment - 08/06/23 1011       6 Minute Walk- Baseline   6 Minute Walk- Baseline yes    BP (mmHg) 155/90    HR (bpm) 88      6 Minute walk- Post Test   6 Minute Walk Post Test yes    BP (mmHg) 157/93    HR (bpm) 78      6 minute walk test results    Aerobic Endurance Distance Walked 1459 ft    Endurance additional comments Pt reporting 8/10 pain stiffness and feels stiffness in both toes. Pt rating 0/10 RPE after walk            Pt reports she incorporates walking at home into her yardwork.     M-CTSIB  Condition 1: Firm Surface, EO 30 Sec, Normal Sway  Condition 2: Firm Surface, EC 30 Sec, Normal Sway  Condition 3: Foam Surface, EO 30 Sec, Normal Sway  Condition 4: Foam Surface, EC 30 Sec, Mild Sway     Access Code: [email or web address] URL: [email or web address] Date: 08/06/2023 Prepared by: Sherlie Ban  Initiated HEP for calf mobility/balance. See MedBridge for further details.   Exercises - Standing Gastroc Stretch  - 2 x daily - 7 x weekly - 3 sets - 30-45 hold - Standing Soleus Stretch  - 2 x daily - 7 x weekly - 3 sets - 30-45 hold - Tandem Walking with Counter Support  - 1-2 x daily - 5 x weekly - 3 sets - Narrow Stance with Eyes Closed and Head Nods on Foam Pad  - 1-2 x daily - 5 x weekly - 2 sets - 10 reps and performing with head turns    PATIENT EDUCATION: Education details:  Initial HEP for calf ROM and balance, purpose of 3 balance systems and how they can be affected with neuropathy  Person educated: Patient Education method: Explanation, Demonstration, Verbal cues, and Handouts Education comprehension: verbalized understanding and returned demonstration  HOME EXERCISE PROGRAM: Access Code:  [email or web address] URL: [email or web address] Date: 08/06/2023 Prepared by: Sherlie Ban  Exercises - Standing Gastroc Stretch  - 2 x daily - 7 x weekly - 3 sets - 30-45 hold - Standing Soleus Stretch  - 2 x daily - 7 x weekly - 3 sets - 30-45 hold - Tandem Walking with Counter Support  - 1-2 x daily - 5 x weekly - 3 sets - Narrow Stance with Eyes Closed and Head Nods on Foam Pad  - 1-2 x daily - 5 x weekly - 2 sets - 10 reps and performing with head turns   GOALS: Goals reviewed with patient? Yes  SHORT TERM GOALS: Target date: 09/04/2023  Pt will be independent with HEP in order to improve strength and balance in order to decrease fall risk and improve function at home and work. Baseline: EVAL- Patient presents with no formal HEP in place Goal status: INITIAL   LONG TERM GOALS: Target date: 10/16/2023  1.  Patient (> 73 years old) will complete five times sit to stand test in < 15 seconds indicating an increased LE strength and improved balance. Baseline: EVAL= 15.51 sec without UE support Goal status: INITIAL  2.   Pt will improve ABC by at least 13% in order to demonstrate clinically significant improvement in balance confidence. Baseline: EVAL= 67.5% Goal status: INITIAL   3. Pt will improve FGA to >/= 27/30 to demonstrate improved balance and reduced fall risk  Baseline: 23/30 Goal status: REVISED   4.   Patient will increase six minute walk test distance by 150 feet for progression to community ambulator and improve gait ability Baseline: EVAL= To be assessed visit #2  Goal not needed, pt WNL for age  Goal status: N/A     ASSESSMENT:  CLINICAL IMPRESSION: Initiated session with the , with pt having elevated BP before and after, but still [facility] for therapy. Pt reports that her BP is normally more elevated in the morning. Pt ambulated 1459' which is WNL for age, LTG not needed. Pt did report incr foot pain afterwards. Assessed mCTSIB for further assessment of  balance. Pt able to hold all 4 conditions for 30 seconds, but did have incr postural sway with condition 4. Remainder of session focused on adding in gastroc/soleus stretches and balance to HEP. Pt demonstrating incr tightness in L soleus. Pt to see orthopedic doctor later this after. Will continue per POC.   OBJECTIVE IMPAIRMENTS: Abnormal gait, decreased balance, decreased mobility, difficulty walking, decreased strength, and pain.   ACTIVITY LIMITATIONS: lifting, standing, squatting, and stairs  PARTICIPATION LIMITATIONS: cleaning, shopping, community activity, and yard work  PERSONAL FACTORS: Age and 1 comorbidity: OA  are also affecting patient's functional outcome.   REHAB POTENTIAL: Good  CLINICAL DECISION MAKING: Stable/uncomplicated  EVALUATION COMPLEXITY: Low  PLAN:  PT FREQUENCY: 1-2x/week  PT DURATION: 12 weeks  PLANNED INTERVENTIONS: 97164- PT Re-evaluation, 97110-Therapeutic exercises, 97530- Therapeutic activity, [id number]- Neuromuscular re-education, 97535- Self Care, 14782- Manual therapy, [phone]- Gait training, [phone]- Orthotic Fit/training, [phone]- Electrical stimulation (manual), Patient/Family education, Balance training, Stair training, Taping, Dry Needling, Joint mobilization, Joint manipulation, DME instructions, Cryotherapy, and Moist heat  PLAN FOR NEXT SESSION: work on LE strengthening and any appropriate mobility/balance interventions. Add exercises to HEP as  appropriate. How did the small L hallux insert feel?  Sherlie Ban, PT, DPT 08/06/23 10:52 AM

## 2023-08-08 ENCOUNTER — Ambulatory Visit: Payer: Medicare Other

## 2023-08-14 ENCOUNTER — Ambulatory Visit

## 2023-08-14 DIAGNOSIS — M6281 Muscle weakness (generalized): Secondary | ICD-10-CM

## 2023-08-14 DIAGNOSIS — R262 Difficulty in walking, not elsewhere classified: Secondary | ICD-10-CM

## 2023-08-14 NOTE — Therapy (Signed)
 OUTPATIENT PHYSICAL THERAPY NEURO TREATMENT   Patient Name: [name] MRN: [id number] DOB:Nov 16, 1945, 78 y.o., female Today's Date: 08/14/2023   PCP: [facility]  REFERRING PROVIDER: Dr. Theora Master  END OF SESSION:  PT End of Session - 08/14/23 1007     Visit Number 4    Number of Visits 24    Date for PT Re-Evaluation 09/04/23    Progress Note Due on Visit 10    PT Start Time 1010    PT Stop Time 1053    PT Time Calculation (min) 43 min    Activity Tolerance Patient tolerated treatment well    Behavior During Therapy [facility] for tasks assessed/performed;Anxious             Past Medical History:  Diagnosis Date   Acute pharyngitis    Acute sinusitis, unspecified    Allergic rhinitis, cause unspecified    Allergy    seasonal   Chronic airway obstruction, not elsewhere classified    Dysfunction of eustachian tube    Esophageal reflux    Hemorrhoids    Hiatal hernia    Hypertension    IBS (irritable bowel syndrome)    Osteopenia    Osteoporosis    Personal history of colonic polyps    Schatzki's ring    Unspecified asthma(493.90)    Unspecified gastritis and gastroduodenitis without mention of hemorrhage    Past Surgical History:  Procedure Laterality Date   ABDOMINAL HYSTERECTOMY     COLONOSCOPY     CYST REMOVAL HAND     FOOT SURGERY     LAPAROSCOPIC HYSTERECTOMY     PARTIAL MASTECTOMY WITH NEEDLE LOCALIZATION Left 08/27/2012   lumpectomy, non cancerous  Ernestene Mention, MD;  Location: [facility] OR;  Service: General;  Laterality: Left;   TONSILLECTOMY     UPPER GASTROINTESTINAL ENDOSCOPY     Patient Active Problem List   Diagnosis Date Noted   OP (osteoporosis) 10/10/2022   Lung nodule 08/27/2017   History of food allergy 07/10/2016   Elevated blood-pressure reading without diagnosis of hypertension 07/10/2016   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   ANEMIA, IRON  DEFICIENCY 04/20/2010   IRRITABLE BOWEL SYNDROME 03/20/2008   DIARRHEA 03/20/2008   ABDOMINAL PAIN, EPIGASTRIC 03/20/2008   GASTRITIS 03/16/2008   HIATAL HERNIA 03/16/2008   History of colonic polyps 03/16/2008   SINUSITIS, ACUTE 12/26/2007   PHARYNGITIS 12/19/2007   DYSFUNCTION, EUSTACHIAN TUBE 03/12/2007   Seasonal and perennial allergic rhinitis 03/12/2007   Asthma, mild intermittent 03/12/2007   COPD mixed type (HCC) 03/12/2007   GERD 03/12/2007    ONSET DATE: 01/2022- Foot surgery  REFERRING DIAG: G62.9 (ICD-10-CM) - Neuropathy   THERAPY DIAG:  Difficulty in walking, not elsewhere classified  Muscle weakness (generalized)  Rationale for Evaluation and Treatment: Rehabilitation  SUBJECTIVE:  SUBJECTIVE STATEMENT: Notes orthotic was adjusted in shoe and it does feel better. No falls. Pt reports BP is normally higher in the morning and then gets lower in the afternoon. Sees podiatrist this afternoon for her plantar fasciitis    Pt accompanied by: self  PERTINENT HISTORY: Patient was seen by Neurology on 05/02/2023 for complaint of ongoing bilateral feet pain, stiffness, and tightness. Symptoms worse in left foot. Surgery Left foot in 2023 (patient reports great toe surgery) and previous plantar fascitis pain.   PAIN:  Are you having pain? Yes: NPRS scale: 8/10 Pain location: Dorsal and some plantar pain across toe box Pain description: Stiffness/tightness  PRECAUTIONS: Fall  PATIENT GOALS: Stay mobile, Improve my balance  OBJECTIVE:  Note: Objective measures were completed at Evaluation unless otherwise noted.  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  Shallow disc bulge at L5-S1  stenosis.   PATIENT SURVEYS:  ABC scale 67.5%                                                                                                                              TREATMENT Self care/home management:  -extensive discussion on pathophys of OA, impacts of surgery on progression of OA and kinematics of joints  -impacts of gait mechanics in regards to joint protection and maximizing toe-off and balance   Therex:  -expanded HEP (see below)      PATIENT EDUCATION: Education details: continue HEP, see above Person educated: Patient Education method: Explanation, Demonstration, Verbal cues, and Handouts Education comprehension: verbalized understanding and returned demonstration  HOME EXERCISE PROGRAM: Access Code: [email or web address] URL: [email or web address] Date: 08/06/2023 Prepared by: Sherlie Ban  Exercises - Standing Gastroc Stretch  - 2 x daily - 7 x weekly - 3 sets - 30-45 hold - Standing Soleus Stretch  - 2 x daily - 7 x weekly - 3 sets - 30-45 hold - Tandem Walking with Counter Support  - 1-2 x daily - 5 x weekly - 3 sets - Narrow Stance with Eyes Closed and Head Nods on Foam Pad  - 1-2 x daily - 5 x weekly - 2 sets - 10 reps and performing with head turns  - Seated Plantar Fascia Stretch  - 1 x daily - 7 x weekly - 3 sets - 30s hold - Seated Self Great Toe Stretch  - 1 x daily - 7 x weekly - 3 sets - 30s hold - Seated Anterior Tibialis Stretch  - 1 x daily - 7 x weekly - 3 sets - 30s hold - Towel Scrunches  - 1 x daily - 7 x weekly - 3 sets - 10 reps - Toe Yoga - Alternating Great Toe and Lesser Toe Extension  - 1 x daily - 7 x weekly - 3 sets - 10 reps - Seated Marble Pick-Up with Toes  - 1 x daily - 7 x weekly -  3 sets - 10 reps  GOALS: Goals reviewed with patient? Yes  SHORT TERM GOALS: Target date: 09/04/2023  Pt will be independent with HEP in order to improve strength and balance in order to decrease fall risk and improve function at home and  work. Baseline: EVAL- Patient presents with no formal HEP in place Goal status: INITIAL   LONG TERM GOALS: Target date: 10/16/2023  1.  Patient (> 30 years old) will complete five times sit to stand test in < 15 seconds indicating an increased LE strength and improved balance. Baseline: EVAL= 15.51 sec without UE support Goal status: INITIAL  2.   Pt will improve ABC by at least 13% in order to demonstrate clinically significant improvement in balance confidence. Baseline: EVAL= 67.5% Goal status: INITIAL   3. Pt will improve FGA to >/= 27/30 to demonstrate improved balance and reduced fall risk  Baseline: 23/30 Goal status: REVISED   4.   Patient will increase six minute walk test distance by 150 feet for progression to community ambulator and improve gait ability Baseline: EVAL= To be assessed visit #2  Goal not needed, pt WNL for age  Goal status: N/A     ASSESSMENT:  CLINICAL IMPRESSION: Patient seen for skilled PT session with emphasis on patient education and expanding HEP. Patient remains anxious about progression of her current condition- however demonstrates no evidence of progression. She continues to guard L great hallux into extension, resulting in shortened structures on dorsum of foot. This has created pain, fear avoidance and limited L terminal stance/toe off resulting in further impaired gait mechanics, balance and jeopardizing further joint integrity. Continue POC.   OBJECTIVE IMPAIRMENTS: Abnormal gait, decreased balance, decreased mobility, difficulty walking, decreased strength, and pain.   ACTIVITY LIMITATIONS: lifting, standing, squatting, and stairs  PARTICIPATION LIMITATIONS: cleaning, shopping, community activity, and yard work  PERSONAL FACTORS: Age and 1 comorbidity: OA  are also affecting patient's functional outcome.   REHAB POTENTIAL: Good  CLINICAL DECISION MAKING: Stable/uncomplicated  EVALUATION COMPLEXITY: Low  PLAN:  PT FREQUENCY:  1-2x/week  PT DURATION: 12 weeks  PLANNED INTERVENTIONS: 97164- PT Re-evaluation, 97110-Therapeutic exercises, 97530- Therapeutic activity, [id number]- Neuromuscular re-education, 97535- Self Care, 16109- Manual therapy, [phone]- Gait training, [phone]- Orthotic Fit/training, [id number]- Electrical stimulation (manual), Patient/Family education, Balance training, Stair training, Taping, Dry Needling, Joint mobilization, Joint manipulation, DME instructions, Cryotherapy, and Moist heat  PLAN FOR NEXT SESSION: work on LE strengthening and any appropriate mobility/balance interventions. Add exercises to HEP as appropriate.   Westley Foots, PT, DPT, CBIS 08/14/23 10:56 AM

## 2023-08-15 ENCOUNTER — Ambulatory Visit: Payer: Medicare Other

## 2023-08-20 ENCOUNTER — Encounter: Payer: Self-pay | Admitting: Physical Therapy

## 2023-08-20 ENCOUNTER — Ambulatory Visit: Payer: Medicare Other

## 2023-08-20 ENCOUNTER — Ambulatory Visit: Admitting: Physical Therapy

## 2023-08-20 VITALS — BP 150/88 | HR 80

## 2023-08-20 DIAGNOSIS — R262 Difficulty in walking, not elsewhere classified: Secondary | ICD-10-CM | POA: Diagnosis not present

## 2023-08-20 DIAGNOSIS — M6281 Muscle weakness (generalized): Secondary | ICD-10-CM

## 2023-08-20 NOTE — Therapy (Signed)
 OUTPATIENT PHYSICAL THERAPY NEURO TREATMENT   Patient Name: [name] MRN: [id number] DOB:24-Mar-1946, 78 y.o., female Today's Date: 08/20/2023   PCP: [facility]  REFERRING PROVIDER: Dr. Theora Master  END OF SESSION:  PT End of Session - 08/20/23 0933     Visit Number 5    Number of Visits 24    Date for PT Re-Evaluation 09/04/23    Progress Note Due on Visit 10    PT Start Time 0932    PT Stop Time 1013    PT Time Calculation (min) 41 min    Equipment Utilized During Treatment Gait belt    Activity Tolerance Patient tolerated treatment well    Behavior During Therapy [facility] for tasks assessed/performed;Anxious             Past Medical History:  Diagnosis Date   Acute pharyngitis    Acute sinusitis, unspecified    Allergic rhinitis, cause unspecified    Allergy    seasonal   Chronic airway obstruction, not elsewhere classified    Dysfunction of eustachian tube    Esophageal reflux    Hemorrhoids    Hiatal hernia    Hypertension    IBS (irritable bowel syndrome)    Osteopenia    Osteoporosis    Personal history of colonic polyps    Schatzki's ring    Unspecified asthma(493.90)    Unspecified gastritis and gastroduodenitis without mention of hemorrhage    Past Surgical History:  Procedure Laterality Date   ABDOMINAL HYSTERECTOMY     COLONOSCOPY     CYST REMOVAL HAND     FOOT SURGERY     LAPAROSCOPIC HYSTERECTOMY     PARTIAL MASTECTOMY WITH NEEDLE LOCALIZATION Left 08/27/2012   lumpectomy, non cancerous  Ernestene Mention, MD;  Location: [facility] OR;  Service: General;  Laterality: Left;   TONSILLECTOMY     UPPER GASTROINTESTINAL ENDOSCOPY     Patient Active Problem List   Diagnosis Date Noted   OP (osteoporosis) 10/10/2022   Lung nodule 08/27/2017   History of food allergy 07/10/2016   Elevated blood-pressure reading without diagnosis of hypertension 07/10/2016   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   ANEMIA, IRON DEFICIENCY 04/20/2010   IRRITABLE BOWEL SYNDROME 03/20/2008   DIARRHEA 03/20/2008   ABDOMINAL PAIN, EPIGASTRIC 03/20/2008   GASTRITIS 03/16/2008   HIATAL HERNIA 03/16/2008   History of colonic polyps 03/16/2008   SINUSITIS, ACUTE 12/26/2007   PHARYNGITIS 12/19/2007   DYSFUNCTION, EUSTACHIAN TUBE 03/12/2007   Seasonal and perennial allergic rhinitis 03/12/2007   Asthma, mild intermittent 03/12/2007   COPD mixed type (HCC) 03/12/2007   GERD 03/12/2007    ONSET DATE: 01/2022- Foot surgery  REFERRING DIAG: G62.9 (ICD-10-CM) - Neuropathy   THERAPY DIAG:  Difficulty in walking, not elsewhere classified  Muscle weakness (generalized)  Rationale for Evaluation and Treatment: Rehabilitation  SUBJECTIVE:  SUBJECTIVE STATEMENT: Patient reports that she is doing well overall. She reports that she is wanting to do things to prevent her pain from being as bad. She reports that she does continue to have some pain with walking  Pt accompanied by: self  PERTINENT HISTORY: Patient was seen by Neurology on 05/02/2023 for complaint of ongoing bilateral feet pain, stiffness, and tightness. Symptoms worse in left foot. Surgery Left foot in 2023 (patient reports great toe surgery) and previous plantar fascitis pain.   PAIN:  Are you having pain? Yes: NPRS scale: 5/10 Pain location: Dorsal and some plantar pain across toe box Pain description: Stiffness/tightness  PRECAUTIONS: Fall  PATIENT GOALS: Stay mobile, Improve my balance  OBJECTIVE:  Note: Objective measures were completed at Evaluation unless otherwise noted.  DIAGNOSTIC FINDINGS: MR LUMBAR SPINE W/O CM 09/07/2022 IMPRESSION: Moderate right foraminal stenosis at L3-4 due to a foraminal disc  protrusion.  Shallow disc bulge at L5-S1 stenosis.   PATIENT SURVEYS:  ABC scale 67.5%                                                                                                                      TREATMENT  Self care/home management:  Vitals:   08/20/23 0943  BP: (!) 150/88  Pulse: 80   Seated at rest on LUE, elevated but WFL for therapy Reviewed education on OA from prior session Footwear and pain management strategies including types of shoes patient could trial and emphasiziing not shortening gait stride for fear of pain    Gait:  STAIRS:  Level of Assistance: Modified independence  Stair Negotiation Technique: Alternating Pattern  with No Rails Single Rail on Right  Number of Stairs: 5 x 4 stairs   Height of Stairs: 6"  Comments: recommend use of rails for safety  GAIT: Gait pattern: antalgic initially until cued  Distance walked: ~ 600 feet Assistive device utilized: None Level of assistance: Modified independence Comments: initally shortenening stride slight due to anticipated pain, briefly educated on stepping and toe push off through terminal stance - patient reports reduction in pain and improved stability   TherEx: Reviewed toe flexion stretch, provided hand over hand feedback as well as visual feedback until patient understood stretch and then performed teach back 3 x 30 seconds + 7 minutes to educated on  Verbally reviewed former HEP Toe flexion isometrics into towel x 5 reps   PATIENT EDUCATION: Education details: continue HEP Person educated: Patient Education method: Programmer, multimedia, Facilities manager, Verbal cues, and Handouts Education comprehension: verbalized understanding and returned demonstration  HOME EXERCISE PROGRAM: Access Code: [email or web address] URL: [email or web address] Date: 08/06/2023 Prepared by: Sherlie Ban  Exercises - Standing Gastroc Stretch  - 2 x daily - 7 x weekly - 3 sets - 30-45 hold - Standing Soleus Stretch  -  2 x daily - 7 x weekly - 3 sets - 30-45 hold - Tandem Walking with Counter Support  - 1-2 x daily - 5 x weekly - 3 sets - Narrow Stance with  Eyes Closed and Head Nods on Foam Pad  - 1-2 x daily - 5 x weekly - 2 sets - 10 reps and performing with head turns  - Seated Plantar Fascia Stretch  - 1 x daily - 7 x weekly - 3 sets - 30s hold - Seated Self Great Toe Stretch  - 1 x daily - 7 x weekly - 3 sets - 30s hold - Seated Anterior Tibialis Stretch  - 1 x daily - 7 x weekly - 3 sets - 30s hold - Towel Scrunches  - 1 x daily - 7 x weekly - 3 sets - 10 reps - Toe Yoga - Alternating Great Toe and Lesser Toe Extension  - 1 x daily - 7 x weekly - 3 sets - 10 reps - Seated Marble Pick-Up with Toes  - 1 x daily - 7 x weekly - 3 sets - 10 reps - Toe flexion stretch 3 x 30 seconds daily  GOALS: Goals reviewed with patient? Yes  SHORT TERM GOALS: Target date: 09/04/2023  Pt will be independent with HEP in order to improve strength and balance in order to decrease fall risk and improve function at home and work. Baseline: EVAL- Patient presents with no formal HEP in place Goal status: INITIAL   LONG TERM GOALS: Target date: 10/16/2023  1.  Patient (> 76 years old) will complete five times sit to stand test in < 15 seconds indicating an increased LE strength and improved balance. Baseline: EVAL= 15.51 sec without UE support Goal status: INITIAL  2.   Pt will improve ABC by at least 13% in order to demonstrate clinically significant improvement in balance confidence. Baseline: EVAL= 67.5% Goal status: INITIAL   3. Pt will improve FGA to >/= 27/30 to demonstrate improved balance and reduced fall risk  Baseline: 23/30 Goal status: REVISED   4.   Patient will increase six minute walk test distance by 150 feet for progression to community ambulator and improve gait ability Baseline: EVAL= To be assessed visit #2  Goal not needed, pt WNL for age  Goal status: N/A     ASSESSMENT:  CLINICAL  IMPRESSION: Patient seen for skilled PT session with emphasis on review of HEP, review of walking without compensation, and pain management strategies. Patient doing excellently on program and anticipate could D/C next session as greater limiter at this time is more apprehension of pain then pain itself. Continue POC.   OBJECTIVE IMPAIRMENTS: Abnormal gait, decreased balance, decreased mobility, difficulty walking, decreased strength, and pain.   ACTIVITY LIMITATIONS: lifting, standing, squatting, and stairs  PARTICIPATION LIMITATIONS: cleaning, shopping, community activity, and yard work  PERSONAL FACTORS: Age and 1 comorbidity: OA  are also affecting patient's functional outcome.   REHAB POTENTIAL: Good  CLINICAL DECISION MAKING: Stable/uncomplicated  EVALUATION COMPLEXITY: Low  PLAN:  PT FREQUENCY: 1-2x/week  PT DURATION: 12 weeks  PLANNED INTERVENTIONS: 97164- PT Re-evaluation, 97110-Therapeutic exercises, 97530- Therapeutic activity, 97112- Neuromuscular re-education, 97535- Self Care, 16606- Manual therapy, [phone]- Gait training, [phone]- Orthotic Fit/training, [phone]- Electrical stimulation (manual), Patient/Family education, Balance training, Stair training, Taping, Dry Needling, Joint mobilization, Joint manipulation, DME instructions, Cryotherapy, and Moist heat  PLAN FOR NEXT SESSION: assess goals and plan for D/C - discussed with patient last session    Westley Foots, PT, DPT, CBIS 08/20/23 12:33 PM

## 2023-08-22 ENCOUNTER — Ambulatory Visit: Payer: Medicare Other

## 2023-08-27 ENCOUNTER — Ambulatory Visit

## 2023-08-27 DIAGNOSIS — R262 Difficulty in walking, not elsewhere classified: Secondary | ICD-10-CM | POA: Diagnosis not present

## 2023-08-27 DIAGNOSIS — M6281 Muscle weakness (generalized): Secondary | ICD-10-CM

## 2023-08-27 NOTE — Therapy (Signed)
 OUTPATIENT PHYSICAL THERAPY NEURO TREATMENT/ DISCHARGE SUMMARY   Patient Name: [name] MRN: [id number] DOB:May 16, 1946, 78 y.o., female Today's Date: 08/27/2023   PCP: [facility]  REFERRING PROVIDER: Dr. Theora Master PHYSICAL THERAPY DISCHARGE SUMMARY  Visits from Start of Care: 6  Current functional level related to goals / functional outcomes: See below   Remaining deficits: See below   Education / Equipment: PT POC, exam findings, gait mechanics, HEP, footwear, pathophys of OA/ plantar fasciitis    Patient agrees to discharge. Patient goals were met. Patient is being discharged due to meeting the stated rehab goals.  END OF SESSION:  PT End of Session - 08/27/23 1009     Visit Number 6    Number of Visits 24    Date for PT Re-Evaluation 09/04/23    Progress Note Due on Visit 10    PT Start Time 1015    PT Stop Time 1039   discharge   PT Time Calculation (min) 24 min    Activity Tolerance Patient tolerated treatment well    Behavior During Therapy [facility] for tasks assessed/performed;Anxious             Past Medical History:  Diagnosis Date   Acute pharyngitis    Acute sinusitis, unspecified    Allergic rhinitis, cause unspecified    Allergy    seasonal   Chronic airway obstruction, not elsewhere classified    Dysfunction of eustachian tube    Esophageal reflux    Hemorrhoids    Hiatal hernia    Hypertension    IBS (irritable bowel syndrome)    Osteopenia    Osteoporosis    Personal history of colonic polyps    Schatzki's ring    Unspecified asthma(493.90)    Unspecified gastritis and gastroduodenitis without mention of hemorrhage    Past Surgical History:  Procedure Laterality Date   ABDOMINAL HYSTERECTOMY     COLONOSCOPY     CYST REMOVAL HAND     FOOT SURGERY     LAPAROSCOPIC HYSTERECTOMY     PARTIAL MASTECTOMY WITH NEEDLE LOCALIZATION Left 08/27/2012   lumpectomy, non cancerous  Ernestene Mention, MD;  Location: [facility]  OR;  Service: General;  Laterality: Left;   TONSILLECTOMY     UPPER GASTROINTESTINAL ENDOSCOPY     Patient Active Problem List   Diagnosis Date Noted   OP (osteoporosis) 10/10/2022   Lung nodule 08/27/2017   History of food allergy 07/10/2016   Elevated blood-pressure reading without diagnosis of hypertension 07/10/2016   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   ANEMIA, IRON DEFICIENCY 04/20/2010   IRRITABLE BOWEL SYNDROME 03/20/2008   DIARRHEA 03/20/2008   ABDOMINAL PAIN, EPIGASTRIC 03/20/2008   GASTRITIS 03/16/2008   HIATAL HERNIA 03/16/2008   History of colonic polyps 03/16/2008   SINUSITIS, ACUTE 12/26/2007   PHARYNGITIS 12/19/2007   DYSFUNCTION, EUSTACHIAN TUBE 03/12/2007   Seasonal and perennial allergic rhinitis 03/12/2007   Asthma, mild intermittent 03/12/2007   COPD mixed type (HCC) 03/12/2007   GERD 03/12/2007    ONSET DATE: 01/2022- Foot surgery  REFERRING DIAG: G62.9 (ICD-10-CM) - Neuropathy   THERAPY DIAG:  Difficulty in walking, not elsewhere classified  Muscle weakness (generalized)  Rationale for Evaluation and Treatment: Rehabilitation  SUBJECTIVE:  SUBJECTIVE STATEMENT: Patient reports doing well. Feels as though exercises are helpful. Agreeable to dc. Denies falls.   Pt accompanied by: self  PERTINENT HISTORY: Patient was seen by Neurology on 05/02/2023 for complaint of ongoing bilateral feet pain, stiffness, and tightness. Symptoms worse in left foot. Surgery Left foot in 2023 (patient reports great toe surgery) and previous plantar fascitis pain.   PAIN:  Are you having pain? Yes: NPRS scale: 8/10 Pain location: Dorsal and some plantar pain across toe box Pain description: Stiffness/tightness  PRECAUTIONS:  Fall  PATIENT GOALS: Stay mobile, Improve my balance  OBJECTIVE:  Note: Objective measures were completed at Evaluation unless otherwise noted.  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  Shallow disc bulge at L5-S1 stenosis.                                                                                                                       TREATMENT Theract: -ABC: 87.5%  [facility] PT Assessment - 08/27/23 0001       Standardized Balance Assessment   Standardized Balance Assessment Five Times Sit to Stand    Five times sit to stand comments  11.56s no UE      Functional Gait  Assessment   Gait assessed  Yes    Gait Level Surface Walks 20 ft in less than 5.5 sec, no assistive devices, good speed, no evidence for imbalance, normal gait pattern, deviates no more than 6 in outside of the 12 in walkway width.    Change in Gait Speed Able to smoothly change walking speed without loss of balance or gait deviation. Deviate no more than 6 in outside of the 12 in walkway width.    Gait with Horizontal Head Turns Performs head turns smoothly with slight change in gait velocity (eg, minor disruption to smooth gait path), deviates 6-10 in outside 12 in walkway width, or uses an assistive device.    Gait with Vertical Head Turns Performs head turns with no change in gait. Deviates no more than 6 in outside 12 in walkway width.    Gait and Pivot Turn Pivot turns safely within 3 sec and stops quickly with no loss of balance.    Step Over Obstacle Is able to step over 2 stacked shoe boxes taped together (9 in total height) without changing gait speed. No evidence of imbalance.    Gait with Narrow Base of Support Is able to ambulate for 10 steps heel to toe with no staggering.    Gait with Eyes Closed Walks 20 ft, no assistive devices, good speed, no evidence of imbalance, normal gait pattern, deviates no  more than 6 in outside 12 in walkway width. Ambulates 20 ft in less than 7 sec.    Ambulating Backwards Walks 20 ft, no assistive devices, good speed, no evidence for imbalance, normal gait    Steps Alternating feet, no rail.    Total  Score 29              PATIENT EDUCATION: Education details: continue HEP, PT POC, exam findings Person educated: Patient Education method: Explanation, Demonstration, Verbal cues, and Handouts Education comprehension: verbalized understanding and returned demonstration  HOME EXERCISE PROGRAM: Access Code: [email or web address] URL: [email or web address] Date: 08/06/2023 Prepared by: Sherlie Ban  Exercises - Standing Gastroc Stretch  - 2 x daily - 7 x weekly - 3 sets - 30-45 hold - Standing Soleus Stretch  - 2 x daily - 7 x weekly - 3 sets - 30-45 hold - Tandem Walking with Counter Support  - 1-2 x daily - 5 x weekly - 3 sets - Narrow Stance with Eyes Closed and Head Nods on Foam Pad  - 1-2 x daily - 5 x weekly - 2 sets - 10 reps and performing with head turns  - Seated Plantar Fascia Stretch  - 1 x daily - 7 x weekly - 3 sets - 30s hold - Seated Self Great Toe Stretch  - 1 x daily - 7 x weekly - 3 sets - 30s hold - Seated Anterior Tibialis Stretch  - 1 x daily - 7 x weekly - 3 sets - 30s hold - Towel Scrunches  - 1 x daily - 7 x weekly - 3 sets - 10 reps - Toe Yoga - Alternating Great Toe and Lesser Toe Extension  - 1 x daily - 7 x weekly - 3 sets - 10 reps - Seated Marble Pick-Up with Toes  - 1 x daily - 7 x weekly - 3 sets - 10 reps - Toe flexion stretch 3 x 30 seconds daily  GOALS: Goals reviewed with patient? Yes  SHORT TERM GOALS: Target date: 09/04/2023  Pt will be independent with HEP in order to improve strength and balance in order to decrease fall risk and improve function at home and work. Baseline: EVAL- Patient presents with no formal HEP in place; provided Goal status: MET   LONG TERM GOALS: Target date: 10/16/2023  1.   Patient (> 39 years old) will complete five times sit to stand test in < 15 seconds indicating an increased LE strength and improved balance. Baseline: EVAL= 15.51 sec without UE support; 11.56s no UE Goal status: MET  2.   Pt will improve ABC by at least 13% in order to demonstrate clinically significant improvement in balance confidence. Baseline: EVAL= 67.5%; 87.5% Goal status: MET   3. Pt will improve FGA to >/= 27/30 to demonstrate improved balance and reduced fall risk  Baseline: 23/30; 29/30 Goal status: MET  4.   Patient will increase six minute walk test distance by 150 feet for progression to community ambulator and improve gait ability Baseline: EVAL= To be assessed visit #2  Goal not needed, pt WNL for age  Goal status: N/A     ASSESSMENT:  CLINICAL IMPRESSION: Patient seen for skilled PT session with emphasis on goal assessment and dc. She met 3/3 LTG. She does still have L hallux pain related to her surgery. This in turn impacts her gait mechanics and further progresses her pain experience. Extensive education on disease pathology. Patient to continue HEP independently and dc from PT.   OBJECTIVE IMPAIRMENTS: Abnormal gait, decreased balance, decreased mobility, difficulty walking, decreased strength, and pain.   ACTIVITY LIMITATIONS: lifting, standing, squatting, and stairs  PARTICIPATION LIMITATIONS: cleaning, shopping, community activity, and yard work  PERSONAL FACTORS: Age and 1 comorbidity: OA  are also affecting patient's functional outcome.  REHAB POTENTIAL: Good  CLINICAL DECISION MAKING: Stable/uncomplicated  EVALUATION COMPLEXITY: Low  PLAN:  PT FREQUENCY: 1-2x/week  PT DURATION: 12 weeks  PLANNED INTERVENTIONS: 97164- PT Re-evaluation, 97110-Therapeutic exercises, 97530- Therapeutic activity, [id number]- Neuromuscular re-education, 97535- Self Care, 16109- Manual therapy, [phone]- Gait training, [phone]- Orthotic Fit/training, [phone]- Electrical  stimulation (manual), Patient/Family education, Balance training, Stair training, Taping, Dry Needling, Joint mobilization, Joint manipulation, DME instructions, Cryotherapy, and Moist heat  PLAN FOR NEXT SESSION: dc from PT  Westley Foots, PT, DPT, CBIS 08/27/23 10:49 AM

## 2023-08-29 ENCOUNTER — Ambulatory Visit: Payer: Medicare Other

## 2023-08-31 ENCOUNTER — Ambulatory Visit: Payer: Medicare Other

## 2023-09-03 ENCOUNTER — Ambulatory Visit

## 2023-09-04 ENCOUNTER — Ambulatory Visit: Payer: Medicare Other | Admitting: Internal Medicine

## 2023-09-06 ENCOUNTER — Ambulatory Visit: Payer: Medicare Other

## 2023-09-10 ENCOUNTER — Ambulatory Visit

## 2023-09-11 ENCOUNTER — Ambulatory Visit: Payer: Medicare Other

## 2023-09-13 ENCOUNTER — Ambulatory Visit: Payer: Medicare Other

## 2023-09-17 ENCOUNTER — Ambulatory Visit: Payer: Medicare Other

## 2023-09-19 ENCOUNTER — Ambulatory Visit: Payer: Medicare Other

## 2023-10-17 NOTE — Progress Notes (Deleted)
 HPI female never smoker followed for Allergic rhinitis, COPD/asthma, complicated by ulcerative colitis, HTN, GERD,  PFT 2011 mild obstruction with air trapping PFT 06/21/16-moderate obstructive airways disease insignificant response to bronchodilator diffusion mildly reduced. FVC 1.97/71%, FEV1 1.41/68%, ratio 0.72, TLC 99%, DLCO 75% HST-04/04/17-AHI 13.5/hour, desaturation to 73%, body weight 112 pounds CT chest 06/21/16-5 mm left lower lobe pulmonary nodule- low risk -----------------------------------------------------.   09/01/22-  78--year-old female never smoker followed for Allergic rhinitis, COPD/Asthma, LLL nodule, fatigue, complicated by ulcerative colitis, GERD, HTN, Peripheral Neuropathy,  No inhalers, Flonase  Body weight today- 97 lbs Covid vax-3 Phizer She is being evaluated for muscle pain following surgery last year.  Breathing is doing fine.  She has not needed any inhalers.  Rhinitis has done well and she does not find much need for her Flonase . See CT scan for cardiac scoring on March 5 showed stable left lower lobe nodule, calcifications in spleen compatible with old granulomatous disease, stable.  Agatson score 0. CT chest 09/11/21-  IMPRESSION: 1. Focal opacity consistent with atelectasis or scarring in the left upper lobe lingula adjacent to the left heart border accounts for the opacity noted on the current chest radiograph. No follow-up indicated. 2. Small, 4 mm, benign nodule in the left lower lobe stable from the 2018 CT. No follow-up indicated. 3. No acute findings.  10/18/23-  61--year-old female never smoker followed for Allergic rhinitis, COPD/Asthma, LLL nodule, fatigue, complicated by ulcerative colitis, GERD, HTN, Peripheral Neuropathy/ Complex Regional Pain Syndrome, ,  No inhalers, Flonase  Body weight today-     ROS-see HPI  "+" = positive Constitutional:   No-   weight loss, night sweats, fevers, chills, + fatigue, lassitude. HEENT:   No-  headaches,  difficulty swallowing, tooth/dental problems, sore throat,       No-  sneezing, itching, ear ache, +nasal congestion, +post nasal drip,  CV:  + chest pain, orthopnea, PND, swelling in lower extremities, anasarca,  dizziness, palpitations Resp: No-   shortness of breath with exertion or at rest.                 productive cough,  No non-productive cough,  No- coughing up of blood.              No-   change in color of mucus.  No- wheezing.   Skin: No-   rash or lesions. GI:  + heartburn, indigestion, abdominal pain, nausea, vomiting,  GU:  MS:  No-   joint pain or swelling.   Neuro-     nothing unusual Psych:  No- change in mood or affect. No depression or anxiety.  No memory loss.  OBJ- Physical Exam General- Alert, Oriented, Affect-appropriate, Distress- none acute, + petite  Skin- rash-none, lesions- none, excoriation- none Lymphadenopathy- none Head- atraumatic            Eyes- Gross vision intact, PERRLA, conjunctivae and secretions clear            Ears- Hearing, canals-normal            Nose-  +Mild turbinate edema, +Septal dev, no-mucus, polyps,  erosions             Throat- Mallampati IV , mucosa clear , drainage- none, tonsils- atrophic Neck- flexible , trachea midline, no stridor , thyroid nl, carotid no bruit Chest - symmetrical excursion , unlabored           Heart/CV- RRR , no murmur , no gallop  , no rub, nl s1  s2                           - JVD- none , edema- none, stasis changes- none, varices- none           Lung- clear, wheeze- none, cough+light , dullness-none, rub- none           Chest wall-  Abd-  Br/ Gen/ Rectal- Not done, not indicated Extrem- cyanosis- none, clubbing, none, atrophy- none, strength- nl Neuro- grossly intact to observation

## 2023-10-18 ENCOUNTER — Ambulatory Visit: Admitting: Internal Medicine

## 2023-11-14 ENCOUNTER — Other Ambulatory Visit: Payer: Self-pay | Admitting: Sports Medicine

## 2023-11-15 ENCOUNTER — Telehealth: Payer: Self-pay

## 2023-11-15 NOTE — Telephone Encounter (Signed)
 Auth Submission: APPROVED Site of care: Site of care: [facility] Payer: [facility] Medication & CPT/J Code(s) submitted: Prolia  (Denosumab ) [id number] Diagnosis Code:  Route of submission (phone, fax, portal): portal Phone # Fax # Auth type: Buy/Bill PB Units/visits requested: 60mg , q69months Reference number: [id number] Approval from: 11/15/23 to 11/14/24

## 2023-11-27 ENCOUNTER — Encounter (HOSPITAL_COMMUNITY): Payer: Self-pay

## 2023-11-27 ENCOUNTER — Ambulatory Visit (HOSPITAL_COMMUNITY): Admission: EM | Admit: 2023-11-27 | Discharge: 2023-11-27 | Disposition: A | Discharge status: Home / Self Care

## 2023-11-27 DIAGNOSIS — S60459A Superficial foreign body of unspecified finger, initial encounter: Secondary | ICD-10-CM

## 2023-11-27 MED ORDER — DOXYCYCLINE HYCLATE 100 MG PO CAPS
100.0000 mg | ORAL_CAPSULE | Freq: Two times a day (BID) | ORAL | 0 refills | Status: AC
Start: 2023-11-27 — End: 2023-12-02

## 2023-11-27 NOTE — ED Provider Notes (Signed)
 [facility]  Note:  This document was prepared using Dragon voice recognition software and may include unintentional dictation errors.  MRN: [id number] DOB: 06-04-1945  Subjective:   [name] is a 78 y.o. female presenting for pain to the right index finger possibly from a splinter in place for 1 week.  Patient reports increased stiffness to her finger swelling, pain.  Patient denies any purulent drainage, redness, warmth.  Patient reports that she was using a wheelbarrow out of the yard without gloves when she felt a splinter stick into her finger.  Patient reports that she pulled out some of the splinter but thinks that there may still be some retained in her skin.  Patient was unsure if she might need antibiotics to prevent infection.  No current facility-administered medications for this encounter.  Current Outpatient Medications:    doxycycline  (VIBRAMYCIN ) 100 MG capsule, Take 1 capsule (100 mg total) by mouth 2 (two) times daily for 5 days., Disp: 10 capsule, Rfl: 0   acyclovir cream (ZOVIRAX) 5 %, , Disp: , Rfl:    b complex vitamins capsule, , Disp: , Rfl:    bismuth subsalicylate (PEPTO BISMOL) 262 MG/15ML suspension, Take 30 mLs by mouth at bedtime as needed for indigestion or diarrhea or loose stools. , Disp: , Rfl:    Calcium Carbonate (CALCIUM 500 PO), Take 500 mg by mouth at bedtime., Disp: , Rfl:    Calcium Carbonate Antacid (TUMS PO), Take 1 tablet by mouth daily as needed (indigestion/heartburn)., Disp: , Rfl:    Cholecalciferol (VITAMIN D3) 50 MCG (2000 UT) capsule, Take 2,000 Units by mouth daily., Disp: , Rfl:    denosumab  (PROLIA ) 60 MG/ML SOLN injection, Inject 60 mg into the skin every 6 (six) months. , Disp: , Rfl:    fluticasone  (FLONASE ) 50 MCG/ACT nasal spray, Place 2 sprays into both nostrils daily as needed for allergies or rhinitis., Disp: 16 mL, Rfl: 11   loperamide (IMODIUM) 2 MG capsule, Take 2 mg by mouth daily as needed for diarrhea or  loose stools. , Disp: , Rfl:    losartan (COZAAR) 50 MG tablet, Take 50 mg by mouth at bedtime., Disp: , Rfl:    Multiple Vitamin (MULTIVITAMIN WITH MINERALS) TABS, Take 1 tablet by mouth at bedtime., Disp: , Rfl:    tiZANidine (ZANAFLEX) 2 MG tablet, , Disp: , Rfl:    Allergies  Allergen Reactions   Alendronate-Cholecalciferol Other (See Comments)   Codeine Nausea Only   Levaquin [Levofloxacin] Rash and Other (See Comments)    Rash and dizziness IN HIGH DOSES-Can used 250mg  dose.       Past Medical History:  Diagnosis Date   Acute pharyngitis    Acute sinusitis, unspecified    Allergic rhinitis, cause unspecified    Allergy     seasonal   Chronic airway obstruction, not elsewhere classified    Dysfunction of eustachian tube    Esophageal reflux    Hemorrhoids    Hiatal hernia    Hypertension    IBS (irritable bowel syndrome)    Osteopenia    Osteoporosis    Personal history of colonic polyps    Schatzki's ring    Unspecified asthma(493.90)    Unspecified gastritis and gastroduodenitis without mention of hemorrhage      Past Surgical History:  Procedure Laterality Date   ABDOMINAL HYSTERECTOMY     COLONOSCOPY     CYST REMOVAL HAND     FOOT SURGERY     LAPAROSCOPIC HYSTERECTOMY  PARTIAL MASTECTOMY WITH NEEDLE LOCALIZATION Left 08/27/2012   lumpectomy, non cancerous  Elon CHRISTELLA Pacini, MD;  Location: [facility] OR;  Service: General;  Laterality: Left;   TONSILLECTOMY     UPPER GASTROINTESTINAL ENDOSCOPY      Family History  Problem Relation Age of Onset   Alzheimer's disease Father    Diabetes Sister    Allergies Sister    Allergies Brother    Allergies Brother    Asthma Maternal Grandfather    Colon cancer Neg Hx    Esophageal cancer Neg Hx     Social History   Tobacco Use   Smoking status: Never    Passive exposure: Never   Smokeless tobacco: Never  Vaping Use   Vaping status: Never Used  Substance Use Topics   Alcohol use: No    Alcohol/week: 0.0  standard drinks of alcohol   Drug use: No    ROS Refer to HPI for ROS details.  Objective:   Vitals: BP (!) 142/91 (BP Location: Right Arm)   Pulse 89   Temp 98.5 F (36.9 C) (Oral)   Resp 18   SpO2 97%   Physical Exam Vitals and nursing note reviewed.  Constitutional:      General: She is not in acute distress.    Appearance: Normal appearance. She is not ill-appearing.  HENT:     Head: Normocephalic.   Cardiovascular:     Rate and Rhythm: Normal rate.  Pulmonary:     Effort: Pulmonary effort is normal. No respiratory distress.   Skin:    General: Skin is warm and dry.     Capillary Refill: Capillary refill takes less than 2 seconds.     Findings: Abscess, erythema and wound present.   Neurological:     General: No focal deficit present.     Mental Status: She is alert and oriented to person, place, and time.   Psychiatric:        Mood and Affect: Mood normal.        Behavior: Behavior normal.     Foreign Body Removal  Date/Time: 11/27/2023 4:12 PM  Performed by: Aurea Ethel NOVAK, NP Authorized by: Aurea Ethel NOVAK, NP   Consent:    Consent obtained:  Verbal   Consent given by:  Patient   Risks discussed:  Bleeding, pain and infection Universal protocol:    Patient identity confirmed:  Verbally with patient and arm band Location:    Location:  Finger   Finger location:  R index finger Anesthesia:    Anesthesia method:  None Procedure type:    Procedure complexity:  Simple Procedure details:    Removal mechanism:  Hemostat   Foreign bodies recovered:  1   Description:  0.75 wooden splinter   Intact foreign body removal: yes   Post-procedure details:    Confirmation:  No additional foreign bodies on visualization   Skin closure:  None   Dressing:  Adhesive bandage   Procedure completion:  Tolerated well, no immediate complications   No results found for this or any previous visit (from the past 24 hours).  No results found.    Assessment and Plan :     Discharge Instructions       1. Splinter of finger (Primary) - doxycycline  (VIBRAMYCIN ) 100 MG capsule; Take 1 capsule (100 mg total) by mouth 2 (two) times daily for 5 days if soft tissue infection continues after splinter removal. - Foreign Body Removal performed by provider in UC with needle  and forceps, 0.75 cm wood splinter removed from right index finger, bandage placed over site.      Adea Geisel B Harol Shabazz   Laverle Pillard, [place], [place] 11/27/23 1616

## 2023-11-27 NOTE — Discharge Instructions (Addendum)
  1. Splinter of finger (Primary) - doxycycline  (VIBRAMYCIN ) 100 MG capsule; Take 1 capsule (100 mg total) by mouth 2 (two) times daily for 5 days if soft tissue infection continues after splinter removal. - Foreign Body Removal performed by provider in UC with needle and forceps, 0.75 cm wood splinter removed from right index finger, bandage placed over site.

## 2023-11-27 NOTE — ED Triage Notes (Signed)
 Pt c/o splinter to rt index finger x1wk. C/o stiffness and pain.

## 2023-12-11 ENCOUNTER — Ambulatory Visit: Payer: Medicare Other

## 2023-12-11 VITALS — BP 152/80 | HR 90 | Temp 98.3°F | Resp 16 | Ht 62.0 in | Wt 94.2 lb

## 2023-12-11 DIAGNOSIS — M818 Other osteoporosis without current pathological fracture: Secondary | ICD-10-CM | POA: Diagnosis not present

## 2023-12-11 MED ORDER — DENOSUMAB 60 MG/ML ~~LOC~~ SOSY
60.0000 mg | PREFILLED_SYRINGE | Freq: Once | SUBCUTANEOUS | Status: AC
  Administered 2023-12-11: 60 mg via SUBCUTANEOUS

## 2023-12-11 NOTE — Progress Notes (Signed)
 Diagnosis: Osteoporosis  Provider:  Mannam, Praveen MD  Procedure: Injection  Prolia  (Denosumab ), Dose: 60 mg, Site: subcutaneous, Number of injections: 1  Injection Site(s): Left arm  Post Care: Patient declined observation  Discharge: Condition: Good, Destination: Home . AVS Declined  Performed by:  Lendel Quant, RN

## 2023-12-23 NOTE — Progress Notes (Signed)
 HPI female never smoker followed for Allergic rhinitis, COPD/asthma, complicated by ulcerative colitis, HTN, GERD,  PFT 2011 mild obstruction with air trapping PFT 06/21/16-moderate obstructive airways disease insignificant response to bronchodilator diffusion mildly reduced. FVC 1.97/71%, FEV1 1.41/68%, ratio 0.72, TLC 99%, DLCO 75% HST-04/04/17-AHI 13.5/hour, desaturation to 73%, body weight 112 pounds CT chest 06/21/16-5 mm left lower lobe pulmonary nodule- low risk -----------------------------------------------------. [name]  09/01/22-  78--year-old female never smoker followed for Allergic rhinitis, COPD/Asthma, LLL nodule, fatigue, complicated by ulcerative colitis, GERD, HTN, Peripheral Neuropathy,  No inhalers, Flonase  Body weight today- 97 lbs Covid vax-3 Phizer She is being evaluated for muscle pain following surgery last year.  Breathing is doing fine.  She has not needed any inhalers.  Rhinitis has done well and she does not find much need for her Flonase . See CT scan for cardiac scoring on March 5 showed stable left lower lobe nodule, calcifications in spleen compatible with old granulomatous disease, stable.  Agatson score 0. CT chest 09/11/21-  IMPRESSION: 1. Focal opacity consistent with atelectasis or scarring in the left upper lobe lingula adjacent to the left heart border accounts for the opacity noted on the current chest radiograph. No follow-up indicated. 2. Small, 4 mm, benign nodule in the left lower lobe stable from the 2018 CT. No follow-up indicated. 3. No acute findings.  12/25/23-  5--year-old female never smoker followed for Allergic rhinitis, COPD/Asthma, LLL nodule, fatigue, complicated by ulcerative colitis, GERD, HTN, Peripheral Neuropathy,  No inhalers, Flonase  Dry cough, mornings only- very chronic, perennial. She is not concerned. Discussed the use of AI scribe software for clinical note transcription with the patient, who gave verbal consent to  proceed.  History of Present Illness   [name] is a 78 year old female who presents with a chronic dry cough, history of asthma and allergic rhinitis..  Xray followed fot hx lung nodule.  She experiences a dry cough primarily in the mornings, which she attributes to clearing congestion. This has been chronic, ongoing throughout the summer but perennial. She denies coughing up mucus, describing it as mostly dry. The cough resolves after the morning. She doesn't notice wheeze.  She has sinus infections in the past but has not had one in over a year. She uses a saline solution over-the-counter for her sinuses. She is noting more nasal congestion and drainage recently.  Her last imaging was a CT scan two years ago, which showed a small nodule and some scarring in the upper left lung, with no acute concerns. She is asks about  a chest x-ray this year as part of her routine follow-up.     Assessment and Plan:    Chronic morning dry cough Chronic dry cough likely due to post-nasal drainage causing throat irritation. No recent wheezing or sinus infections. Cough is dry with no mucus. - Order chest x-ray to evaluate for changes since 2023. - Recommend OTC fluticasone  nasal spray, 1-2 sprays per nostril at bedtime during symptoms. - Advise consistent use of nasal spray for several days for effect. - Discussed minimal systemic absorption and safety of nasal spray.   Lung nodule-  considered benign -CXR     ROS-see HPI  + = positive Constitutional:   No-   weight loss, night sweats, fevers, chills, + fatigue, lassitude. HEENT:   No-  headaches, difficulty swallowing, tooth/dental problems, sore throat,       No-  sneezing, itching, ear ache, +nasal congestion, +post nasal drip,  CV:  + chest pain, orthopnea,  PND, swelling in lower extremities, anasarca,  dizziness, palpitations Resp: No-   shortness of breath with exertion or at rest.                 productive cough,  No non-productive  cough,  No- coughing up of blood.              No-   change in color of mucus.  No- wheezing.   Skin: No-   rash or lesions. GI:  + heartburn, indigestion, abdominal pain, nausea, vomiting,  GU:  MS:  No-   joint pain or swelling.   Neuro-     nothing unusual Psych:  No- change in mood or affect. No depression or anxiety.  No memory loss.  OBJ- Physical Exam General- Alert, Oriented, Affect-appropriate, Distress- none acute, + petite  Skin- rash-none, lesions- none, excoriation- none Lymphadenopathy- none Head- atraumatic            Eyes- Gross vision intact, PERRLA, conjunctivae and secretions clear            Ears- Hearing, canals-normal            Nose-  +Mild turbinate edema, +Septal dev, no-mucus, polyps,  erosions             Throat- Mallampati IV , mucosa clear , drainage- none, tonsils- atrophic Neck- flexible , trachea midline, no stridor , thyroid nl, carotid no bruit Chest - symmetrical excursion , unlabored           Heart/CV- RRR , no murmur , no gallop  , no rub, nl s1 s2                           - JVD- none , edema- none, stasis changes- none, varices- none           Lung- clear, wheeze- none, cough-none while here , dullness-none, rub- none           Chest wall-  Abd-  Br/ Gen/ Rectal- Not done, not indicated Extrem- cyanosis- none, clubbing, none, atrophy- none, strength- nl Neuro- grossly intact to observation

## 2023-12-25 ENCOUNTER — Ambulatory Visit

## 2023-12-25 ENCOUNTER — Ambulatory Visit (INDEPENDENT_AMBULATORY_CARE_PROVIDER_SITE_OTHER): Admitting: Internal Medicine

## 2023-12-25 ENCOUNTER — Encounter: Payer: Self-pay | Admitting: Internal Medicine

## 2023-12-25 VITALS — BP 139/84 | HR 86 | Temp 98.8°F | Resp 18 | Ht 62.0 in | Wt 96.0 lb

## 2023-12-25 DIAGNOSIS — J302 Other seasonal allergic rhinitis: Secondary | ICD-10-CM | POA: Diagnosis not present

## 2023-12-25 DIAGNOSIS — J4520 Mild intermittent asthma, uncomplicated: Secondary | ICD-10-CM | POA: Diagnosis not present

## 2023-12-25 DIAGNOSIS — R911 Solitary pulmonary nodule: Secondary | ICD-10-CM

## 2023-12-25 DIAGNOSIS — J3089 Other allergic rhinitis: Secondary | ICD-10-CM | POA: Diagnosis not present

## 2023-12-25 NOTE — Patient Instructions (Signed)
 Order- CXR dx lung nodule  Suggest otc nasal steroid spray like Flonase  (fluticasone ) 1-2 puffs each nostril every night at bedtime, while needed  Please call if we can help

## 2023-12-30 ENCOUNTER — Encounter: Payer: Self-pay | Admitting: Internal Medicine

## 2024-01-02 ENCOUNTER — Ambulatory Visit: Payer: Self-pay | Admitting: Internal Medicine

## 2024-01-09 NOTE — Telephone Encounter (Signed)
 Patient advised cxr stable. NFN

## 2024-02-22 ENCOUNTER — Emergency Department (HOSPITAL_COMMUNITY)

## 2024-02-22 ENCOUNTER — Emergency Department (HOSPITAL_COMMUNITY): Admission: EM | Admit: 2024-02-22 | Discharge: 2024-02-22 | Disposition: A | Discharge status: Home / Self Care

## 2024-02-22 ENCOUNTER — Ambulatory Visit (HOSPITAL_COMMUNITY)
Admission: EM | Admit: 2024-02-22 | Discharge: 2024-02-22 | Disposition: A | Discharge status: Home / Self Care | Attending: Family Medicine | Admitting: Family Medicine

## 2024-02-22 ENCOUNTER — Encounter (HOSPITAL_COMMUNITY): Payer: Self-pay

## 2024-02-22 ENCOUNTER — Other Ambulatory Visit: Payer: Self-pay

## 2024-02-22 DIAGNOSIS — I10 Essential (primary) hypertension: Secondary | ICD-10-CM | POA: Insufficient documentation

## 2024-02-22 DIAGNOSIS — R519 Headache, unspecified: Secondary | ICD-10-CM | POA: Diagnosis present

## 2024-02-22 DIAGNOSIS — R7989 Other specified abnormal findings of blood chemistry: Secondary | ICD-10-CM | POA: Diagnosis not present

## 2024-02-22 DIAGNOSIS — R030 Elevated blood-pressure reading, without diagnosis of hypertension: Secondary | ICD-10-CM | POA: Diagnosis not present

## 2024-02-22 DIAGNOSIS — Z7951 Long term (current) use of inhaled steroids: Secondary | ICD-10-CM | POA: Insufficient documentation

## 2024-02-22 DIAGNOSIS — Z79899 Other long term (current) drug therapy: Secondary | ICD-10-CM | POA: Insufficient documentation

## 2024-02-22 DIAGNOSIS — R9431 Abnormal electrocardiogram [ECG] [EKG]: Secondary | ICD-10-CM | POA: Diagnosis not present

## 2024-02-22 DIAGNOSIS — J449 Chronic obstructive pulmonary disease, unspecified: Secondary | ICD-10-CM | POA: Diagnosis not present

## 2024-02-22 LAB — BASIC METABOLIC PANEL WITH GFR
Anion gap: 11 (ref 5–15)
BUN: 23 mg/dL (ref 8–23)
CO2: 25 mmol/L (ref 22–32)
Calcium: 9.3 mg/dL (ref 8.9–10.3)
Chloride: 100 mmol/L (ref 98–111)
Creatinine, Ser: 1.09 mg/dL — ABNORMAL HIGH (ref 0.44–1.00)
GFR, Estimated: 52 mL/min — ABNORMAL LOW (ref >60)
Glucose, Bld: 124 mg/dL — ABNORMAL HIGH (ref 70–99)
Potassium: 4.5 mmol/L (ref 3.5–5.1)
Sodium: 136 mmol/L (ref 135–145)

## 2024-02-22 LAB — CBC WITH DIFFERENTIAL/PLATELET
Abs Immature Granulocytes: 0.03 K/uL (ref 0.00–0.07)
Basophils Absolute: 0 K/uL (ref 0.0–0.1)
Basophils Relative: 0 %
Eosinophils Absolute: 0 K/uL (ref 0.0–0.5)
Eosinophils Relative: 0 %
HCT: 37.6 % (ref 36.0–46.0)
Hemoglobin: 12.5 g/dL (ref 12.0–15.0)
Immature Granulocytes: 0 %
Lymphocytes Relative: 11 %
Lymphs Abs: 1.1 K/uL (ref 0.7–4.0)
MCH: 31 pg (ref 26.0–34.0)
MCHC: 33.2 g/dL (ref 30.0–36.0)
MCV: 93.3 fL (ref 80.0–100.0)
Monocytes Absolute: 0.4 K/uL (ref 0.1–1.0)
Monocytes Relative: 4 %
Neutro Abs: 8.5 K/uL — ABNORMAL HIGH (ref 1.7–7.7)
Neutrophils Relative %: 85 %
Platelets: 247 K/uL (ref 150–400)
RBC: 4.03 MIL/uL (ref 3.87–5.11)
RDW: 12.5 % (ref 11.5–15.5)
WBC: 10 K/uL (ref 4.0–10.5)
nRBC: 0 % (ref 0.0–0.2)

## 2024-02-22 LAB — TROPONIN I (HIGH SENSITIVITY): Troponin I (High Sensitivity): 6 ng/L (ref <18)

## 2024-02-22 NOTE — Discharge Instructions (Addendum)
 Please go to the emergency room for further evaluation of your elevated blood pressure, headache, and abnormal EKG

## 2024-02-22 NOTE — ED Provider Notes (Signed)
 [facility] Provider Note   CSN: [id number] Arrival date & time: 02/22/24  1048     Patient presents with: Headache and Hypertension   [name] is a 78 y.o. female with past medical history significant for asthma, COPD, GERD who presents with concern for sensation of blood rushing in ears, mild headache, and elevated blood pressure.  Patient reports that she checked her blood pressure last night multiple times due to sensation of blood rushing, she could not sleep.  She reports she has never had a reaction to a cortisone shot in the past.  She denies any numbness, tingling, vision changes, chest pain, shortness of breath.  She reports that her headache feels the same as the mild tension headache that she normally gets from allergies.  She reports it is overall resolved at time of our conversation.    Headache Hypertension Associated symptoms include headaches.       Prior to Admission medications   Medication Sig Start Date End Date Taking? Authorizing Provider  acyclovir cream (ZOVIRAX) 5 %  10/31/12   [provider]  b complex vitamins capsule  06/14/22   [provider]  bismuth subsalicylate (PEPTO BISMOL) 262 MG/15ML suspension Take 30 mLs by mouth at bedtime as needed for indigestion or diarrhea or loose stools.     [provider]  Calcium Carbonate (CALCIUM 500 PO) Take 500 mg by mouth at bedtime.    [provider]  Calcium Carbonate Antacid (TUMS PO) Take 1 tablet by mouth daily as needed (indigestion/heartburn).    [provider]  Cholecalciferol (VITAMIN D3) 50 MCG (2000 UT) capsule Take 2,000 Units by mouth daily.    [provider]  denosumab  (PROLIA ) 60 MG/ML SOLN injection Inject 60 mg into the skin every 6 (six) months.     [provider]  fluticasone  (FLONASE ) 50 MCG/ACT nasal spray Place 2 sprays into both nostrils daily as needed for allergies or  rhinitis. 02/23/23   Darlean Ozell NOVAK, MD  loperamide (IMODIUM) 2 MG capsule Take 2 mg by mouth daily as needed for diarrhea or loose stools.     [provider]  losartan (COZAAR) 50 MG tablet Take 50 mg by mouth at bedtime. 04/07/19   [provider]  Multiple Vitamin (MULTIVITAMIN WITH MINERALS) TABS Take 1 tablet by mouth at bedtime.    [provider]  tiZANidine (ZANAFLEX) 2 MG tablet  08/26/22   [provider]    Allergies: Alendronate-cholecalciferol, Codeine, and Levaquin [levofloxacin]    Review of Systems  Neurological:  Positive for headaches.  All other systems reviewed and are negative.   Updated Vital Signs BP (!) 161/88 (BP Location: Left Arm)   Pulse 79   Temp 97.8 F (36.6 C)   Resp 19   SpO2 100%   Physical Exam Vitals and nursing note reviewed.  Constitutional:      General: She is not in acute distress.    Appearance: Normal appearance.  HENT:     Head: Normocephalic and atraumatic.  Eyes:     General:        Right eye: No discharge.        Left eye: No discharge.  Cardiovascular:     Rate and Rhythm: Normal rate and regular rhythm.     Heart sounds: No murmur heard.    No friction rub. No gallop.  Pulmonary:     Effort: Pulmonary effort is normal.  Breath sounds: Normal breath sounds.  Abdominal:     General: Bowel sounds are normal.     Palpations: Abdomen is soft.  Skin:    General: Skin is warm and dry.     Capillary Refill: Capillary refill takes less than 2 seconds.  Neurological:     Mental Status: She is alert and oriented to person, place, and time.     Comments: Cranial nerves II through XII grossly intact.  Intact finger-nose, intact heel-to-shin.  Romberg negative, gait normal.  Alert and oriented x3.  Moves all 4 limbs spontaneously, normal coordination.  No pronator drift.  Intact strength 5 out of 5 bilateral upper and lower extremities.  Psychiatric:        Mood and Affect: Mood normal.         Behavior: Behavior normal.     (all labs ordered are listed, but only abnormal results are displayed) Labs Reviewed  BASIC METABOLIC PANEL WITH GFR - Abnormal; Notable for the following components:      Result Value   Glucose, Bld 124 (*)    Creatinine, Ser 1.09 (*)    GFR, Estimated 52 (*)    All other components within normal limits  CBC WITH DIFFERENTIAL/PLATELET - Abnormal; Notable for the following components:   Neutro Abs 8.5 (*)    All other components within normal limits  TROPONIN I (HIGH SENSITIVITY)  TROPONIN I (HIGH SENSITIVITY)    EKG: None  Radiology: DG Chest 2 View Result Date: 02/22/2024 EXAM: 2 VIEW(S) XRAY OF THE CHEST 02/22/2024 11:38:00 AM COMPARISON: 12/25/2023 CLINICAL HISTORY: cp. Elevated BP, abnormal EKG x today, nonsmoker FINDINGS: LUNGS AND PLEURA: Hyperinflation, consistent with COPD. No focal pulmonary opacity. No pulmonary edema. No pleural effusion. No pneumothorax. HEART AND MEDIASTINUM: No acute abnormality of the cardiac and mediastinal silhouettes. BONES AND SOFT TISSUES: Mild thoracic spondylosis. No acute osseous abnormality. IMPRESSION: 1. Hyperinflation consistent with COPD. Electronically signed by: Waddell Calk MD 02/22/2024 12:10 PM EDT RP Workstation: [id number]     Procedures   Medications Ordered in the ED - No data to display                                  Medical Decision Making  Medical Decision Making:   [name] is a 78 y.o. female who presented to the ED today with hypertension, headache detailed above.    Patient's presentation is complicated by their history of gerd, asthma.  Complete initial physical exam performed, notably the patient  was hypertensive, blood pressure 161/88.  Vital signs otherwise stable.  Patient is neurologically intact throughout on exam..    Reviewed and confirmed nursing documentation for past medical history, family history, social history.    Initial Assessment:   With the patient's  presentation of elevated blood pressure readings, most likely diagnosis is hypertensive urgency. Other diagnoses associated with hypertensive emergency were considered including (but not limited to) intracranial hemorrhage, acute renal artery stenosis, acute kidney injury, myocardial stress, ophthalmologic emergencies. These are considered less likely due to history of present illness and physical exam findings.   This is most consistent with an acute life/limb threatening illness complicated by underlying chronic conditions. Will evaluate for hypertensive emergency as below.  Initial Plan:   Screening labs including CBC and Metabolic panel to evaluate for infectious or metabolic etiology of disease.  CXR to evaluate for structural/infectious intrathoracic pathology.  Offered CT head to evaluate for  intracranial pathology.  Patient declines, reporting that her headache feels similar to her normal allergies, given that she has no neurologic abnormalities blood pressure is overall within a reasonable limit of 161/88 in the emergency department I think reasonable to forego head CT at this time. EKG and single troponin to evaluate for cardiac pathology. Single troponin appropriate due to greater than 6 hours since maximal intensity of symptoms. Objective evaluation as below reviewed. Considered further administration of antihypertensives in ED, per consensus guidelines for [facility] of emergency physicians, acute treatment of hypertensive urgency alone in the emergency department is not recommended.  If patient has evidence of hypertensive emergency on objective laboratory evaluation, will reevaluate.  Will monitor blood pressure while patient awaiting above laboratory studies.  Initial Study Results:   Laboratory  All laboratory results reviewed without evidence of clinically relevant pathology.   Creatinine mildly elevated compared to baseline for 4 years ago, 1.09 today.  Normal BUN, suspect  chronic change.  EKG EKG was reviewed independently. Rate, rhythm, axis, intervals all examined and without medically relevant abnormality. ST segments without concerns for elevations.  Reviewed by Dr. Mona, appears similar compared to her baseline  Radiology:  All images reviewed independently. Agree with radiology report at this time.   DG Chest 2 View Result Date: 02/22/2024 EXAM: 2 VIEW(S) XRAY OF THE CHEST 02/22/2024 11:38:00 AM COMPARISON: 12/25/2023 CLINICAL HISTORY: cp. Elevated BP, abnormal EKG x today, nonsmoker FINDINGS: LUNGS AND PLEURA: Hyperinflation, consistent with COPD. No focal pulmonary opacity. No pulmonary edema. No pleural effusion. No pneumothorax. HEART AND MEDIASTINUM: No acute abnormality of the cardiac and mediastinal silhouettes. BONES AND SOFT TISSUES: Mild thoracic spondylosis. No acute osseous abnormality. IMPRESSION: 1. Hyperinflation consistent with COPD. Electronically signed by: Waddell Calk MD 02/22/2024 12:10 PM EDT RP Workstation: [id number]    Reassessment and Plan:   Overall asymptomatic hypertension, suspect worsened secondary to stress, recent cortisone shot, no additional treatment indicated in the emergency department, discussed extensive return precautions.   Final diagnoses:  Hypertension, unspecified type  Acute nonintractable headache, unspecified headache type    ED Discharge Orders     None          Rosan Sherlean DEL, PA-C 02/22/24 1444    Neysa Caron PARAS, [place] 02/22/24 1536

## 2024-02-22 NOTE — Discharge Instructions (Addendum)
 Your workup today was reassuring, I think that your elevated blood pressure is a side effect of the steroid medication.  It should resolve on its own over the next few days.  Please return if you have chest pain, shortness of breath, worsening headache, new numbness, vision changes.  Unless you are feeling symptomatic you do not need to routinely check your blood pressure.

## 2024-02-22 NOTE — ED Triage Notes (Signed)
 Pt stated, I had a cortisone shot yesterday and I think its caused my BP to be elevated and a headache. I was taking my BP all yesterday evening, I went to UC and they sent me here to be reevaluated.

## 2024-02-22 NOTE — ED Provider Notes (Addendum)
 [facility]    CSN: [id number] Arrival date & time: 02/22/24  0907      History   Chief Complaint No chief complaint on file.   HPI [name] is a 78 y.o. female presents for blood pressure elevation.  Patient states yesterday she got a cortisone injection into her SI joint at her orthopedic.  This is her second injection.  States later that night she was checking her blood pressure and noticed it was elevated.  States she normally runs 140/90 and does not take any BP medications.  States it was fluctuating from 150s to 190/90-100 last night.  She states she does have a headache currently and had some trouble sleeping last night.  She also reports she feels woozy but thinks it may be from her allergies and not sleeping well.  Does endorse a pressure in ears with a roaring sound. She denies chest pain, shortness of breath, dizziness, visual changes, syncope or lateral weakness, slurred speech, facial droop.  Has not taken any OTC NSAIDs.  Does take allergy  medicine but denies decongestants.  Her blood pressure on intake was 150/91.  She called her orthopedics and she was told to go to the ER or urgent care.  No other concerns at this time.  HPI  Past Medical History:  Diagnosis Date   Acute pharyngitis    Acute sinusitis, unspecified    Allergic rhinitis, cause unspecified    Allergy     seasonal   Chronic airway obstruction, not elsewhere classified    Dysfunction of eustachian tube    Esophageal reflux    Hemorrhoids    Hiatal hernia    Hypertension    IBS (irritable bowel syndrome)    Osteopenia    Osteoporosis    Personal history of colonic polyps    Schatzki's ring    Unspecified asthma(493.90)    Unspecified gastritis and gastroduodenitis without mention of hemorrhage     Patient Active Problem List   Diagnosis Date Noted   OP (osteoporosis) 10/10/2022   Lung nodule 08/27/2017   History of food allergy  07/10/2016   Elevated blood-pressure reading  without diagnosis of hypertension 07/10/2016   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   ANEMIA, IRON  DEFICIENCY 04/20/2010   IRRITABLE BOWEL SYNDROME 03/20/2008   DIARRHEA 03/20/2008   ABDOMINAL PAIN, EPIGASTRIC 03/20/2008   GASTRITIS 03/16/2008   HIATAL HERNIA 03/16/2008   History of colonic polyps 03/16/2008   SINUSITIS, ACUTE 12/26/2007   PHARYNGITIS 12/19/2007   DYSFUNCTION, EUSTACHIAN TUBE 03/12/2007   Seasonal and perennial allergic rhinitis 03/12/2007   Asthma, mild intermittent 03/12/2007   COPD mixed type (HCC) 03/12/2007   GERD 03/12/2007    Past Surgical History:  Procedure Laterality Date   ABDOMINAL HYSTERECTOMY     COLONOSCOPY     CYST REMOVAL HAND     FOOT SURGERY     LAPAROSCOPIC HYSTERECTOMY     PARTIAL MASTECTOMY WITH NEEDLE LOCALIZATION Left 08/27/2012   lumpectomy, non cancerous  Elon CHRISTELLA Pacini, MD;  Location: [facility] OR;  Service: General;  Laterality: Left;   TONSILLECTOMY     UPPER GASTROINTESTINAL ENDOSCOPY      OB History   No obstetric history on file.      Home Medications    Prior to Admission medications   Medication Sig Start Date End Date Taking? Authorizing Provider  b complex vitamins capsule  06/14/22  Yes [provider]  Calcium Carbonate Antacid (TUMS PO) Take 1  tablet by mouth daily as needed (indigestion/heartburn).   Yes [provider]  Cholecalciferol (VITAMIN D3) 50 MCG (2000 UT) capsule Take 2,000 Units by mouth daily.   Yes [provider]  denosumab  (PROLIA ) 60 MG/ML SOLN injection Inject 60 mg into the skin every 6 (six) months.    Yes [provider]  Multiple Vitamin (MULTIVITAMIN WITH MINERALS) TABS Take 1 tablet by mouth at bedtime.   Yes [provider]  acyclovir cream (ZOVIRAX) 5 %  10/31/12   [provider]  bismuth subsalicylate (PEPTO BISMOL) 262 MG/15ML suspension Take 30 mLs by mouth at bedtime as needed  for indigestion or diarrhea or loose stools.     [provider]  Calcium Carbonate (CALCIUM 500 PO) Take 500 mg by mouth at bedtime.    [provider]  fluticasone  (FLONASE ) 50 MCG/ACT nasal spray Place 2 sprays into both nostrils daily as needed for allergies or rhinitis. 02/23/23   Darlean Ozell NOVAK, MD  loperamide (IMODIUM) 2 MG capsule Take 2 mg by mouth daily as needed for diarrhea or loose stools.     [provider]  losartan (COZAAR) 50 MG tablet Take 50 mg by mouth at bedtime. 04/07/19   [provider]  tiZANidine (ZANAFLEX) 2 MG tablet  08/26/22   [provider]    Family History Family History  Problem Relation Age of Onset   Alzheimer's disease Father    Diabetes Sister    Allergies Sister    Allergies Brother    Allergies Brother    Asthma Maternal Grandfather    Colon cancer Neg Hx    Esophageal cancer Neg Hx     Social History Social History   Tobacco Use   Smoking status: Never    Passive exposure: Never   Smokeless tobacco: Never  Vaping Use   Vaping status: Never Used  Substance Use Topics   Alcohol use: No    Alcohol/week: 0.0 standard drinks of alcohol   Drug use: No     Allergies   Alendronate-cholecalciferol, Codeine, and Levaquin [levofloxacin]   Review of Systems Review of Systems  Constitutional:        Elevated BP after steroid injection     Physical Exam Triage Vital Signs ED Triage Vitals [02/22/24 0941]  Encounter Vitals Group     BP (!) 150/91     Girls Systolic BP Percentile      Girls Diastolic BP Percentile      Boys Systolic BP Percentile      Boys Diastolic BP Percentile      Pulse Rate 86     Resp 18     Temp 98.4 F (36.9 C)     Temp Source Oral     SpO2 98 %     Weight      Height      Head Circumference      Peak Flow      Pain Score      Pain Loc      Pain Education      Exclude from Growth Chart    No data found.  Updated Vital Signs BP (!) 150/91 (BP  Location: Left Arm)   Pulse 86   Temp 98.4 F (36.9 C) (Oral)   Resp 18   SpO2 98%   Visual Acuity Right Eye Distance:   Left Eye Distance:   Bilateral Distance:    Right Eye Near:   Left Eye Near:    Bilateral  Near:     Physical Exam Vitals and nursing note reviewed.  Constitutional:      General: She is not in acute distress.    Appearance: Normal appearance. She is not ill-appearing.  HENT:     Head: Normocephalic and atraumatic.  Eyes:     Extraocular Movements: Extraocular movements intact.     Conjunctiva/sclera: Conjunctivae normal.     Pupils: Pupils are equal, round, and reactive to light.  Cardiovascular:     Rate and Rhythm: Normal rate and regular rhythm.     Heart sounds: Normal heart sounds.  Pulmonary:     Effort: Pulmonary effort is normal.     Breath sounds: Normal breath sounds.  Skin:    General: Skin is warm and dry.  Neurological:     General: No focal deficit present.     Mental Status: She is alert and oriented to person, place, and time.  Psychiatric:        Mood and Affect: Mood normal.        Behavior: Behavior normal.      UC Treatments / Results  Labs (all labs ordered are listed, but only abnormal results are displayed) Labs Reviewed - No data to display  EKG   Radiology No results found.  Procedures Procedures (including critical care time)  Medications Ordered in UC Medications - No data to display  Initial Impression / Assessment and Plan / UC Course  I have reviewed the triage vital signs and the nursing notes.  Pertinent labs & imaging results that were available during my care of the patient were reviewed by me and considered in my medical decision making (see chart for details).     Reviewed exam and symptoms with patient.  I did discuss with patient steroid injections can cause her blood pressure to go up.  EKG does show sinus rhythm with septal infarct age undetermined.  Given patient's blood pressure readings,  headache, and abnormal EKG I advise she go to the emergency room for further evaluation.  She is in agreement with plan will go POV to the ER. Final Clinical Impressions(s) / UC Diagnoses   Final diagnoses:  Elevated BP without diagnosis of hypertension  Acute nonintractable headache, unspecified headache type  Abnormal EKG     Discharge Instructions      Please go to the emergency room for further evaluation of your elevated blood pressure, headache, and abnormal EKG     ED Prescriptions   None    PDMP not reviewed this encounter.   Loreda Myla SAUNDERS, NP 02/22/24 1038    Loreda Myla SAUNDERS, NP 02/22/24 1041

## 2024-02-22 NOTE — ED Provider Triage Note (Signed)
 Emergency Medicine Provider Triage Evaluation Note  [name] , a 78 y.o. female  was evaluated in triage.  Pt complains of elevated blood pressure.  Patient reports recent cortisone shot in hip.  She reports elevated blood pressure subsequently.  She presented to urgent care.  Urgent care was concerned about her blood pressure and abnormal EKG.  She was referred to the ED for further evaluation.  Patient denies chest pain.  She denies shortness of breath.  She denies headache.  She reports that she is currently comfortable.  She reports that she does not take anything for her blood pressure.  Review of Systems  Positive: Elevated blood pressure Negative: Chest pain, shortness of breath, headache  Physical Exam  BP (!) 161/88 (BP Location: Left Arm)   Pulse 79   Temp 97.8 F (36.6 C)   Resp 19   SpO2 100%  Gen:   Awake, no distress   Resp:  Normal effort  MSK:   Moves extremities without difficulty  Other:    Medical Decision Making  Medically screening exam initiated at 11:12 AM.  Appropriate orders placed.  Kalia Jeschke was informed that the remainder of the evaluation will be completed by another provider, this initial triage assessment does not replace that evaluation, and the importance of remaining in the ED until their evaluation is complete.     Laurice Maude BROCKS, MD 02/22/24 [phone]

## 2024-04-03 ENCOUNTER — Other Ambulatory Visit: Payer: Self-pay | Admitting: Registered Nurse

## 2024-04-03 DIAGNOSIS — G4452 New daily persistent headache (NDPH): Secondary | ICD-10-CM

## 2024-06-17 ENCOUNTER — Ambulatory Visit

## 2024-06-24 ENCOUNTER — Ambulatory Visit

## 2024-07-01 ENCOUNTER — Ambulatory Visit

## 2024-07-08 ENCOUNTER — Ambulatory Visit
